# Patient Record
Sex: Female | Born: 1935 | Race: Black or African American | Hispanic: No | Smoking: Never smoker
Health system: Southern US, Community
[De-identification: ages and names within clinical notes are randomized; demographics above are authoritative.]

## PROBLEM LIST (undated history)

## (undated) DIAGNOSIS — I10 Essential (primary) hypertension: Secondary | ICD-10-CM

## (undated) DIAGNOSIS — R002 Palpitations: Secondary | ICD-10-CM

## (undated) DIAGNOSIS — J189 Pneumonia, unspecified organism: Secondary | ICD-10-CM

## (undated) DIAGNOSIS — E7800 Pure hypercholesterolemia, unspecified: Secondary | ICD-10-CM

## (undated) DIAGNOSIS — R251 Tremor, unspecified: Secondary | ICD-10-CM

## (undated) DIAGNOSIS — M316 Other giant cell arteritis: Secondary | ICD-10-CM

## (undated) DIAGNOSIS — R42 Dizziness and giddiness: Secondary | ICD-10-CM

## (undated) DIAGNOSIS — I441 Atrioventricular block, second degree: Secondary | ICD-10-CM

## (undated) HISTORY — DX: Dizziness and giddiness: R42

## (undated) HISTORY — PX: BLADDER SURGERY: SHX569

## (undated) HISTORY — DX: Other giant cell arteritis: M31.6

## (undated) HISTORY — DX: Palpitations: R00.2

## (undated) HISTORY — DX: Atrioventricular block, second degree: I44.1

## (undated) HISTORY — PX: VAGINAL HYSTERECTOMY: SHX2639

## (undated) HISTORY — DX: Tremor, unspecified: R25.1

---

## 1997-12-23 ENCOUNTER — Other Ambulatory Visit: Admission: RE | Admit: 1997-12-23 | Discharge: 1997-12-23 | Payer: Self-pay | Admitting: Internal Medicine

## 1999-09-18 ENCOUNTER — Encounter: Payer: Self-pay | Admitting: Internal Medicine

## 1999-09-18 ENCOUNTER — Encounter: Admission: RE | Admit: 1999-09-18 | Discharge: 1999-09-18 | Payer: Self-pay | Admitting: Internal Medicine

## 2000-01-01 ENCOUNTER — Other Ambulatory Visit: Admission: RE | Admit: 2000-01-01 | Discharge: 2000-01-01 | Payer: Self-pay | Admitting: Internal Medicine

## 2000-02-19 ENCOUNTER — Encounter: Payer: Self-pay | Admitting: Internal Medicine

## 2000-02-19 ENCOUNTER — Ambulatory Visit (HOSPITAL_COMMUNITY): Admission: RE | Admit: 2000-02-19 | Discharge: 2000-02-19 | Payer: Self-pay | Admitting: Internal Medicine

## 2000-09-19 ENCOUNTER — Encounter: Admission: RE | Admit: 2000-09-19 | Discharge: 2000-09-19 | Payer: Self-pay | Admitting: Internal Medicine

## 2000-09-19 ENCOUNTER — Encounter: Payer: Self-pay | Admitting: Internal Medicine

## 2000-12-19 ENCOUNTER — Encounter: Payer: Self-pay | Admitting: Internal Medicine

## 2000-12-19 ENCOUNTER — Ambulatory Visit (HOSPITAL_COMMUNITY): Admission: RE | Admit: 2000-12-19 | Discharge: 2000-12-19 | Payer: Self-pay | Admitting: Internal Medicine

## 2001-09-22 ENCOUNTER — Encounter: Admission: RE | Admit: 2001-09-22 | Discharge: 2001-09-22 | Payer: Self-pay | Admitting: Internal Medicine

## 2001-09-22 ENCOUNTER — Encounter: Payer: Self-pay | Admitting: Internal Medicine

## 2002-02-25 ENCOUNTER — Other Ambulatory Visit: Admission: RE | Admit: 2002-02-25 | Discharge: 2002-02-25 | Payer: Self-pay | Admitting: Internal Medicine

## 2002-09-25 ENCOUNTER — Encounter: Payer: Self-pay | Admitting: Internal Medicine

## 2002-09-25 ENCOUNTER — Encounter: Admission: RE | Admit: 2002-09-25 | Discharge: 2002-09-25 | Payer: Self-pay | Admitting: Internal Medicine

## 2003-09-11 ENCOUNTER — Emergency Department (HOSPITAL_COMMUNITY): Admission: AD | Admit: 2003-09-11 | Discharge: 2003-09-11 | Payer: Self-pay | Admitting: Family Medicine

## 2003-09-27 ENCOUNTER — Encounter: Admission: RE | Admit: 2003-09-27 | Discharge: 2003-09-27 | Payer: Self-pay | Admitting: Internal Medicine

## 2004-09-27 ENCOUNTER — Encounter: Admission: RE | Admit: 2004-09-27 | Discharge: 2004-09-27 | Payer: Self-pay | Admitting: Internal Medicine

## 2004-11-08 ENCOUNTER — Other Ambulatory Visit: Admission: RE | Admit: 2004-11-08 | Discharge: 2004-11-08 | Payer: Self-pay | Admitting: Internal Medicine

## 2005-04-25 ENCOUNTER — Ambulatory Visit (HOSPITAL_COMMUNITY): Admission: RE | Admit: 2005-04-25 | Discharge: 2005-04-25 | Payer: Self-pay | Admitting: Internal Medicine

## 2005-10-01 ENCOUNTER — Encounter: Admission: RE | Admit: 2005-10-01 | Discharge: 2005-10-01 | Payer: Self-pay | Admitting: Internal Medicine

## 2006-10-03 ENCOUNTER — Encounter: Admission: RE | Admit: 2006-10-03 | Discharge: 2006-10-03 | Payer: Self-pay | Admitting: Internal Medicine

## 2007-10-06 ENCOUNTER — Encounter: Admission: RE | Admit: 2007-10-06 | Discharge: 2007-10-06 | Payer: Self-pay | Admitting: Internal Medicine

## 2008-07-28 ENCOUNTER — Encounter: Admission: RE | Admit: 2008-07-28 | Discharge: 2008-07-28 | Payer: Self-pay | Admitting: Orthopedic Surgery

## 2008-10-06 ENCOUNTER — Encounter: Admission: RE | Admit: 2008-10-06 | Discharge: 2008-10-06 | Payer: Self-pay | Admitting: Internal Medicine

## 2009-10-07 ENCOUNTER — Encounter
Admission: RE | Admit: 2009-10-07 | Discharge: 2009-10-07 | Payer: Self-pay | Source: Home / Self Care | Admitting: Internal Medicine

## 2010-08-11 ENCOUNTER — Ambulatory Visit (HOSPITAL_COMMUNITY)
Admission: RE | Admit: 2010-08-11 | Discharge: 2010-08-11 | Payer: Self-pay | Source: Home / Self Care | Attending: Internal Medicine | Admitting: Internal Medicine

## 2010-08-11 LAB — CREATININE, SERUM
GFR calc Af Amer: 51 mL/min — ABNORMAL LOW (ref >60)
GFR calc non Af Amer: 42 mL/min — ABNORMAL LOW (ref >60)

## 2010-08-23 ENCOUNTER — Other Ambulatory Visit: Payer: Self-pay | Admitting: Internal Medicine

## 2010-08-23 DIAGNOSIS — Z1231 Encounter for screening mammogram for malignant neoplasm of breast: Secondary | ICD-10-CM

## 2010-10-09 ENCOUNTER — Ambulatory Visit
Admission: RE | Admit: 2010-10-09 | Discharge: 2010-10-09 | Disposition: A | Discharge status: Home / Self Care | Payer: Medicare Other | Source: Ambulatory Visit | Attending: Internal Medicine | Admitting: Internal Medicine

## 2010-10-09 DIAGNOSIS — Z1231 Encounter for screening mammogram for malignant neoplasm of breast: Secondary | ICD-10-CM

## 2011-07-25 ENCOUNTER — Other Ambulatory Visit: Payer: Self-pay | Admitting: Internal Medicine

## 2011-07-25 DIAGNOSIS — Z1231 Encounter for screening mammogram for malignant neoplasm of breast: Secondary | ICD-10-CM

## 2011-07-30 DIAGNOSIS — I10 Essential (primary) hypertension: Secondary | ICD-10-CM | POA: Diagnosis not present

## 2011-07-30 DIAGNOSIS — E7800 Pure hypercholesterolemia, unspecified: Secondary | ICD-10-CM | POA: Diagnosis not present

## 2011-07-30 DIAGNOSIS — M159 Polyosteoarthritis, unspecified: Secondary | ICD-10-CM | POA: Diagnosis not present

## 2011-07-30 DIAGNOSIS — E559 Vitamin D deficiency, unspecified: Secondary | ICD-10-CM | POA: Diagnosis not present

## 2011-07-30 DIAGNOSIS — I776 Arteritis, unspecified: Secondary | ICD-10-CM | POA: Diagnosis not present

## 2011-08-27 DIAGNOSIS — E7800 Pure hypercholesterolemia, unspecified: Secondary | ICD-10-CM | POA: Diagnosis not present

## 2011-08-27 DIAGNOSIS — I776 Arteritis, unspecified: Secondary | ICD-10-CM | POA: Diagnosis not present

## 2011-08-27 DIAGNOSIS — E559 Vitamin D deficiency, unspecified: Secondary | ICD-10-CM | POA: Diagnosis not present

## 2011-08-27 DIAGNOSIS — I10 Essential (primary) hypertension: Secondary | ICD-10-CM | POA: Diagnosis not present

## 2011-08-27 DIAGNOSIS — M316 Other giant cell arteritis: Secondary | ICD-10-CM | POA: Diagnosis not present

## 2011-09-24 DIAGNOSIS — I10 Essential (primary) hypertension: Secondary | ICD-10-CM | POA: Diagnosis not present

## 2011-09-24 DIAGNOSIS — M316 Other giant cell arteritis: Secondary | ICD-10-CM | POA: Diagnosis not present

## 2011-09-24 DIAGNOSIS — R079 Chest pain, unspecified: Secondary | ICD-10-CM | POA: Diagnosis not present

## 2011-09-24 DIAGNOSIS — E559 Vitamin D deficiency, unspecified: Secondary | ICD-10-CM | POA: Diagnosis not present

## 2011-09-24 DIAGNOSIS — E7800 Pure hypercholesterolemia, unspecified: Secondary | ICD-10-CM | POA: Diagnosis not present

## 2011-09-24 DIAGNOSIS — M159 Polyosteoarthritis, unspecified: Secondary | ICD-10-CM | POA: Diagnosis not present

## 2011-09-25 ENCOUNTER — Ambulatory Visit (HOSPITAL_COMMUNITY)
Admission: RE | Admit: 2011-09-25 | Discharge: 2011-09-25 | Disposition: A | Discharge status: Home / Self Care | Payer: Medicare Other | Source: Ambulatory Visit | Attending: Internal Medicine | Admitting: Internal Medicine

## 2011-09-25 ENCOUNTER — Other Ambulatory Visit: Payer: Self-pay | Admitting: Internal Medicine

## 2011-09-25 DIAGNOSIS — M47814 Spondylosis without myelopathy or radiculopathy, thoracic region: Secondary | ICD-10-CM | POA: Diagnosis not present

## 2011-09-25 DIAGNOSIS — R0602 Shortness of breath: Secondary | ICD-10-CM | POA: Diagnosis not present

## 2011-09-25 DIAGNOSIS — I10 Essential (primary) hypertension: Secondary | ICD-10-CM | POA: Insufficient documentation

## 2011-10-10 ENCOUNTER — Ambulatory Visit
Admission: RE | Admit: 2011-10-10 | Discharge: 2011-10-10 | Disposition: A | Discharge status: Home / Self Care | Payer: BC Managed Care – PPO | Source: Ambulatory Visit | Attending: Internal Medicine | Admitting: Internal Medicine

## 2011-10-10 DIAGNOSIS — Z1231 Encounter for screening mammogram for malignant neoplasm of breast: Secondary | ICD-10-CM

## 2011-10-25 DIAGNOSIS — N390 Urinary tract infection, site not specified: Secondary | ICD-10-CM | POA: Diagnosis not present

## 2011-10-30 DIAGNOSIS — E559 Vitamin D deficiency, unspecified: Secondary | ICD-10-CM | POA: Diagnosis not present

## 2011-10-30 DIAGNOSIS — M316 Other giant cell arteritis: Secondary | ICD-10-CM | POA: Diagnosis not present

## 2011-10-30 DIAGNOSIS — E7800 Pure hypercholesterolemia, unspecified: Secondary | ICD-10-CM | POA: Diagnosis not present

## 2011-10-30 DIAGNOSIS — I10 Essential (primary) hypertension: Secondary | ICD-10-CM | POA: Diagnosis not present

## 2011-11-08 DIAGNOSIS — N390 Urinary tract infection, site not specified: Secondary | ICD-10-CM | POA: Diagnosis not present

## 2011-12-01 ENCOUNTER — Encounter (HOSPITAL_COMMUNITY): Payer: Self-pay | Admitting: Emergency Medicine

## 2011-12-01 ENCOUNTER — Emergency Department (INDEPENDENT_AMBULATORY_CARE_PROVIDER_SITE_OTHER): Payer: BC Managed Care – PPO

## 2011-12-01 ENCOUNTER — Emergency Department (INDEPENDENT_AMBULATORY_CARE_PROVIDER_SITE_OTHER)
Admission: EM | Admit: 2011-12-01 | Discharge: 2011-12-01 | Disposition: A | Discharge status: Home / Self Care | Payer: BC Managed Care – PPO | Source: Home / Self Care | Attending: Emergency Medicine | Admitting: Emergency Medicine

## 2011-12-01 DIAGNOSIS — R05 Cough: Secondary | ICD-10-CM | POA: Diagnosis not present

## 2011-12-01 DIAGNOSIS — J189 Pneumonia, unspecified organism: Secondary | ICD-10-CM

## 2011-12-01 DIAGNOSIS — R509 Fever, unspecified: Secondary | ICD-10-CM | POA: Diagnosis not present

## 2011-12-01 HISTORY — DX: Pure hypercholesterolemia, unspecified: E78.00

## 2011-12-01 HISTORY — DX: Essential (primary) hypertension: I10

## 2011-12-01 MED ORDER — LIDOCAINE HCL (PF) 1 % IJ SOLN
INTRAMUSCULAR | Status: AC
  Filled 2011-12-01: qty 5

## 2011-12-01 MED ORDER — CEFTRIAXONE SODIUM 1 G IJ SOLR
INTRAMUSCULAR | Status: AC
  Filled 2011-12-01: qty 10

## 2011-12-01 MED ORDER — CEFTRIAXONE SODIUM 1 G IJ SOLR
1.0000 g | Freq: Once | INTRAMUSCULAR | Status: AC
  Administered 2011-12-01: 1 g via INTRAMUSCULAR

## 2011-12-01 MED ORDER — AZITHROMYCIN 250 MG PO TABS: ORAL_TABLET | ORAL | Status: AC

## 2011-12-01 MED ORDER — BENZONATATE 200 MG PO CAPS: 200.0000 mg | ORAL_CAPSULE | Freq: Three times a day (TID) | ORAL | Status: AC | PRN

## 2011-12-01 NOTE — Discharge Instructions (Signed)

## 2011-12-01 NOTE — ED Provider Notes (Signed)
Chief Complaint  Patient presents with  . Nasal Congestion    History of Present Illness:   The patient is a 77 year old female who has had a five-day history of cough productive yellow sputum, wheezing, and chest tightness. She denies any chest pain, fever, or chills, but she has had some sweats. Her throat burns, she's had sore throat, hoarseness, nasal congestion with yellow drainage, headache, and sinus pressure. She has a history of asthma as a child and has had pneumonia in the past. She is taking prednisone 10 mg per day for temporal arteritis.  Review of Systems:  Other than noted above, the patient denies any of the following symptoms. Systemic:  No fever, chills, sweats, fatigue, myalgias, headache, or anorexia. Eye:  No redness, pain or drainage. ENT:  No earache, ear congestion, nasal congestion, sneezing, rhinorrhea, sinus pressure, sinus pain, post nasal drip, or sore throat. Lungs:  No cough, sputum production, wheezing, shortness of breath, or chest pain. GI:  No abdominal pain, nausea, vomiting, or diarrhea. Skin:  No rash or itching.  PMFSH:  Past medical history, family history, social history, meds, and allergies were reviewed.  Physical Exam:   Vital signs:  BP 166/104  Pulse 95  Temp(Src) 100.6 F (38.1 C) (Oral)  Resp 18  SpO2 96% General:  Alert, in no distress. Eye:  No conjunctival injection or drainage. Lids were normal. ENT:  TMs and canals were normal, without erythema or inflammation.  Nasal mucosa was clear and uncongested, without drainage.  Mucous membranes were moist.  Pharynx was clear, without exudate or drainage.  There were no oral ulcerations or lesions. Neck:  Supple, no adenopathy, tenderness or mass. Lungs:  No respiratory distress.  Lungs were clear to auscultation, without wheezes, rales or rhonchi.  Breath sounds were clear and equal bilaterally. Lungs were resonant to percussion.  No egophony. Heart:  Regular rhythm, without gallops, murmers  or rubs. Skin:  Clear, warm, and dry, without rash or lesions.  Radiology:  Dg Chest 2 View  12/01/2011  *RADIOLOGY REPORT*  Clinical Data: Cough and fever.  CHEST - 2 VIEW  Comparison: 09/25/2011 and prior chest radiographs  Findings: The cardiomediastinal silhouette is unremarkable. Slightly increased opacity at the medial right lung base is noted and pneumonia is not excluded. There is no evidence of pleural effusion, pulmonary mass or pneumothorax. No acute bony abnormalities are identified.  IMPRESSION: Increasing opacity medial right lung base - pneumonia not excluded.  Original Report Authenticated By: Rosendo Gros, M.D.   Course in Urgent Care Center:   She was given Rocephin 1 g IM and tolerated this well without any immediate side effects.  Assessment:  The encounter diagnosis was Community acquired pneumonia.  Plan:   1.  The following meds were prescribed:   New Prescriptions   AZITHROMYCIN (ZITHROMAX Z-PAK) 250 MG TABLET    Take as directed.   BENZONATATE (TESSALON) 200 MG CAPSULE    Take 1 capsule (200 mg total) by mouth 3 (three) times daily as needed for cough.   2.  The patient was instructed in symptomatic care and handouts were given. 3.  The patient was told to return if becoming worse in any way, if no better in 3 or 4 days, and given some red flag symptoms that would indicate earlier return. She is to return in 48 hours for recheck. I also suggested a followup chest x-ray in a month.   Reuben Likes, MD 12/01/11 [id number]

## 2011-12-01 NOTE — ED Notes (Signed)
Congestion, headache, coughing, runny nose, hoarsness

## 2011-12-04 ENCOUNTER — Emergency Department (INDEPENDENT_AMBULATORY_CARE_PROVIDER_SITE_OTHER)
Admission: EM | Admit: 2011-12-04 | Discharge: 2011-12-04 | Disposition: A | Discharge status: Home / Self Care | Payer: BC Managed Care – PPO | Source: Home / Self Care | Attending: Family Medicine | Admitting: Family Medicine

## 2011-12-04 ENCOUNTER — Emergency Department (INDEPENDENT_AMBULATORY_CARE_PROVIDER_SITE_OTHER): Payer: BC Managed Care – PPO

## 2011-12-04 ENCOUNTER — Encounter (HOSPITAL_COMMUNITY): Payer: Self-pay | Admitting: *Deleted

## 2011-12-04 DIAGNOSIS — R918 Other nonspecific abnormal finding of lung field: Secondary | ICD-10-CM | POA: Diagnosis not present

## 2011-12-04 DIAGNOSIS — R05 Cough: Secondary | ICD-10-CM | POA: Diagnosis not present

## 2011-12-04 DIAGNOSIS — J189 Pneumonia, unspecified organism: Secondary | ICD-10-CM | POA: Diagnosis not present

## 2011-12-04 HISTORY — DX: Pneumonia, unspecified organism: J18.9

## 2011-12-04 NOTE — ED Provider Notes (Signed)
History     CSN: [id number]  Arrival date & time 12/04/11  1033   First MD Initiated Contact with Patient 12/04/11 1221      Chief Complaint  Patient presents with  . Follow-up    (Consider location/radiation/quality/duration/timing/severity/associated sxs/prior treatment) Patient is a 77 y.o. female presenting with cough. The history is provided by the patient.  Cough This is a recurrent problem. The current episode started more than 2 days ago (seen here 5/18 with dx of pneumonia, taking med  but still coughing.Marland Kitchen). The problem has been gradually improving. The cough is non-productive. There has been no fever. Pertinent negatives include no chest pain, no chills and no shortness of breath.    Past Medical History  Diagnosis Date  . Hypertension   . High cholesterol   . Pneumonia     Past Surgical History  Procedure Date  . Bladder surgery     No family history on file.  History  Substance Use Topics  . Smoking status: Never Smoker   . Smokeless tobacco: Not on file  . Alcohol Use: No    OB History    Grav Para Term Preterm Abortions TAB SAB Ect Mult Living                  Review of Systems  Constitutional: Negative.  Negative for chills.  HENT: Negative.   Respiratory: Positive for cough. Negative for shortness of breath.   Cardiovascular: Negative for chest pain.  Gastrointestinal: Negative.   Skin: Negative for rash.    Allergies  Review of patient's allergies indicates no known allergies.  Home Medications   Current Outpatient Rx  Name Route Sig Dispense Refill  . ALBUTEROL IN Inhalation Inhale into the lungs.    . ASPIRIN 81 MG PO TABS Oral Take 81 mg by mouth daily.    . ATORVASTATIN CALCIUM 20 MG PO TABS Oral Take 20 mg by mouth daily.    . AZITHROMYCIN 250 MG PO TABS  Take as directed. 6 tablet 0  . BENZONATATE 200 MG PO CAPS Oral Take 1 capsule (200 mg total) by mouth 3 (three) times daily as needed for cough. 30 capsule 0  . CALCIUM 500  PO Oral Take by mouth.    . GUAIFENESIN ER 600 MG PO TB12 Oral Take 1,200 mg by mouth 2 (two) times daily.    Marland Kitchen LORATADINE 10 MG PO TABS Oral Take 10 mg by mouth daily.    Marland Kitchen LOSARTAN POTASSIUM PO Oral Take by mouth.    Marland Kitchen OVER THE COUNTER MEDICATION  Cough drops    . PREDNISONE 10 MG PO TABS Oral Take 10 mg by mouth daily.      BP 110/74  Pulse 75  Temp(Src) 97.7 F (36.5 C) (Oral)  Resp 18  SpO2 97%  Physical Exam  Nursing note and vitals reviewed. Constitutional: She is oriented to person, place, and time. She appears well-developed and well-nourished.  HENT:  Head: Normocephalic.  Right Ear: External ear normal.  Left Ear: External ear normal.  Mouth/Throat: Oropharynx is clear and moist.  Eyes: Conjunctivae and EOM are normal. Pupils are equal, round, and reactive to light.  Neck: Normal range of motion. Neck supple.  Cardiovascular: Normal rate, regular rhythm, normal heart sounds and intact distal pulses.   Pulmonary/Chest: Effort normal and breath sounds normal.  Musculoskeletal: She exhibits no edema.  Lymphadenopathy:    She has no cervical adenopathy.  Neurological: She is alert and oriented to person, place,  and time.  Skin: Skin is warm and dry.  Psychiatric: She has a normal mood and affect.    ED Course  Procedures (including critical care time)  Labs Reviewed - No data to display Dg Chest 2 View  12/04/2011  *RADIOLOGY REPORT*  Clinical Data: History of cough.  Possible pneumonia.  CHEST - 2 VIEW  Comparison: Chest x-ray 12/01/2011.  Findings: Lung volumes are normal.  No consolidative airspace disease.  No pleural effusions.  No pneumothorax.  No pulmonary nodule or mass noted.  Pulmonary vasculature and the cardiomediastinal silhouette are within normal limits.  IMPRESSION: 1. No radiographic evidence of acute cardiopulmonary disease. 2.  Previously noted ill-defined opacity in the medial aspect of the right base is no longer clearly identified.  This could  reflect positive response to therapy if the patient has been treated for presumed pneumonia.  Original Report Authenticated By: Florencia Reasons, M.D.     1. CAP (community acquired pneumonia)       MDM  X-rays reviewed and report per radiologist.         Linna Hoff, MD 12/04/11 1355

## 2011-12-04 NOTE — ED Notes (Signed)
Pt  Seen    Seen   4  Days     Ago  For     pnuemonia        She   Reports         She    Is  Taking  Her  meds        And  Although  Still  Has  A  Cough  She  Is  Better          She    Is  Awake  As  Well  As    Alert  And  oriented   She  Is  Sitting upright on  Exam table  Speaking in  Complete  sentances        Alert  And  Oriented

## 2011-12-04 NOTE — Discharge Instructions (Signed)
Finish medicine , drink lots of water, return or see your doctor if further problems.

## 2011-12-11 DIAGNOSIS — J189 Pneumonia, unspecified organism: Secondary | ICD-10-CM | POA: Diagnosis not present

## 2011-12-11 DIAGNOSIS — E7800 Pure hypercholesterolemia, unspecified: Secondary | ICD-10-CM | POA: Diagnosis not present

## 2011-12-11 DIAGNOSIS — I10 Essential (primary) hypertension: Secondary | ICD-10-CM | POA: Diagnosis not present

## 2011-12-11 DIAGNOSIS — M159 Polyosteoarthritis, unspecified: Secondary | ICD-10-CM | POA: Diagnosis not present

## 2011-12-25 ENCOUNTER — Other Ambulatory Visit: Payer: Self-pay | Admitting: Internal Medicine

## 2011-12-25 ENCOUNTER — Ambulatory Visit
Admission: RE | Admit: 2011-12-25 | Discharge: 2011-12-25 | Disposition: A | Discharge status: Home / Self Care | Payer: Medicare Other | Source: Ambulatory Visit | Attending: Internal Medicine | Admitting: Internal Medicine

## 2011-12-25 DIAGNOSIS — J189 Pneumonia, unspecified organism: Secondary | ICD-10-CM

## 2011-12-25 DIAGNOSIS — Z8701 Personal history of pneumonia (recurrent): Secondary | ICD-10-CM | POA: Diagnosis not present

## 2011-12-25 DIAGNOSIS — R0602 Shortness of breath: Secondary | ICD-10-CM | POA: Diagnosis not present

## 2011-12-25 DIAGNOSIS — R05 Cough: Secondary | ICD-10-CM | POA: Diagnosis not present

## 2012-01-22 DIAGNOSIS — I10 Essential (primary) hypertension: Secondary | ICD-10-CM | POA: Diagnosis not present

## 2012-01-22 DIAGNOSIS — E559 Vitamin D deficiency, unspecified: Secondary | ICD-10-CM | POA: Diagnosis not present

## 2012-01-22 DIAGNOSIS — M159 Polyosteoarthritis, unspecified: Secondary | ICD-10-CM | POA: Diagnosis not present

## 2012-01-22 DIAGNOSIS — R5383 Other fatigue: Secondary | ICD-10-CM | POA: Diagnosis not present

## 2012-01-22 DIAGNOSIS — E7800 Pure hypercholesterolemia, unspecified: Secondary | ICD-10-CM | POA: Diagnosis not present

## 2012-04-28 DIAGNOSIS — M159 Polyosteoarthritis, unspecified: Secondary | ICD-10-CM | POA: Diagnosis not present

## 2012-04-28 DIAGNOSIS — R7989 Other specified abnormal findings of blood chemistry: Secondary | ICD-10-CM | POA: Diagnosis not present

## 2012-04-28 DIAGNOSIS — I10 Essential (primary) hypertension: Secondary | ICD-10-CM | POA: Diagnosis not present

## 2012-04-28 DIAGNOSIS — E669 Obesity, unspecified: Secondary | ICD-10-CM | POA: Diagnosis not present

## 2012-04-28 DIAGNOSIS — H5710 Ocular pain, unspecified eye: Secondary | ICD-10-CM | POA: Diagnosis not present

## 2012-04-28 DIAGNOSIS — M316 Other giant cell arteritis: Secondary | ICD-10-CM | POA: Diagnosis not present

## 2012-04-28 DIAGNOSIS — E7800 Pure hypercholesterolemia, unspecified: Secondary | ICD-10-CM | POA: Diagnosis not present

## 2012-05-22 DIAGNOSIS — H40039 Anatomical narrow angle, unspecified eye: Secondary | ICD-10-CM | POA: Diagnosis not present

## 2012-05-28 DIAGNOSIS — H2510 Age-related nuclear cataract, unspecified eye: Secondary | ICD-10-CM | POA: Diagnosis not present

## 2012-05-28 DIAGNOSIS — H40039 Anatomical narrow angle, unspecified eye: Secondary | ICD-10-CM | POA: Diagnosis not present

## 2012-06-04 ENCOUNTER — Other Ambulatory Visit: Payer: Self-pay | Admitting: Internal Medicine

## 2012-06-04 DIAGNOSIS — I10 Essential (primary) hypertension: Secondary | ICD-10-CM | POA: Diagnosis not present

## 2012-06-04 DIAGNOSIS — M316 Other giant cell arteritis: Secondary | ICD-10-CM | POA: Diagnosis not present

## 2012-06-04 DIAGNOSIS — I776 Arteritis, unspecified: Secondary | ICD-10-CM | POA: Diagnosis not present

## 2012-06-04 DIAGNOSIS — E559 Vitamin D deficiency, unspecified: Secondary | ICD-10-CM | POA: Diagnosis not present

## 2012-06-04 DIAGNOSIS — M159 Polyosteoarthritis, unspecified: Secondary | ICD-10-CM | POA: Diagnosis not present

## 2012-06-04 DIAGNOSIS — E538 Deficiency of other specified B group vitamins: Secondary | ICD-10-CM | POA: Diagnosis not present

## 2012-06-05 DIAGNOSIS — H40039 Anatomical narrow angle, unspecified eye: Secondary | ICD-10-CM | POA: Diagnosis not present

## 2012-06-09 ENCOUNTER — Ambulatory Visit (HOSPITAL_COMMUNITY)
Admission: RE | Admit: 2012-06-09 | Discharge: 2012-06-09 | Disposition: A | Discharge status: Home / Self Care | Payer: Medicare Other | Source: Ambulatory Visit | Attending: Internal Medicine | Admitting: Internal Medicine

## 2012-06-09 DIAGNOSIS — R51 Headache: Secondary | ICD-10-CM | POA: Insufficient documentation

## 2012-06-15 ENCOUNTER — Emergency Department (INDEPENDENT_AMBULATORY_CARE_PROVIDER_SITE_OTHER)
Admission: EM | Admit: 2012-06-15 | Discharge: 2012-06-15 | Disposition: A | Discharge status: Home / Self Care | Payer: Medicare Other | Source: Home / Self Care | Attending: Family Medicine | Admitting: Family Medicine

## 2012-06-15 ENCOUNTER — Encounter (HOSPITAL_COMMUNITY): Payer: Self-pay

## 2012-06-15 DIAGNOSIS — I10 Essential (primary) hypertension: Secondary | ICD-10-CM | POA: Diagnosis not present

## 2012-06-15 NOTE — ED Notes (Addendum)
C/o facial swelling and hypertension, states that sx started today, patient states that she was taken off Amlodipine, Atorvastatin by Dr Chestine Spore, states that she was told to tale Losartan 100 mg and that she last took that on Wednesday, says it makes her "sick" states that it gives her a headache , facial and neck pain

## 2012-06-15 NOTE — ED Provider Notes (Signed)
History     CSN: [id number]  Arrival date & time 06/15/12  1033   First MD Initiated Contact with Patient 06/15/12 1150      Chief Complaint  Patient presents with  . Facial Swelling    (Consider location/radiation/quality/duration/timing/severity/associated sxs/prior treatment) HPI Comments: 77 y/o female with h/o HTN, currently on prednisone due to recent diagnosis of temporal arteritis. Patient explains to me she recently saw her primary doctor Chestine Spore) who asked to stop taking Amlodipine 5 mg and cholesterol medication. She does not know why these medications were stopped. She was then started on losartan 100 mg she took only one time, she thinks she might also have a prescription in the pharmacy for a "fluid pill" that she has not filled yet. Patient reports feeling "sick" after taking losartan for first time 4 days ago. She is vague describing her symptoms but reports nausea, neck discomfort and headache. She has not taken this medication since (was off blood pressure medications for 2 days) was feeling well but she took her blood pressure this morning at home and was "high" she decided to take a amlodipine 5 mg this morning and came here. She denies current headache, dizziness, chest discomfort, shortness of breath or leg swelling. She reports has been feeling her face "puffy" for several days. Patient blood pressure here is 134/93. Patient also unsure abut how long to take prednisone.    Past Medical History  Diagnosis Date  . Hypertension   . High cholesterol   . Pneumonia     Past Surgical History  Procedure Date  . Bladder surgery     No family history on file.  History  Substance Use Topics  . Smoking status: Never Smoker   . Smokeless tobacco: Not on file  . Alcohol Use: No    OB History    Grav Para Term Preterm Abortions TAB SAB Ect Mult Living                  Review of Systems  Constitutional: Negative for fever, chills, appetite change and fatigue.    HENT: Positive for facial swelling and neck pain. Negative for tinnitus.   Eyes: Negative for visual disturbance.  Respiratory: Negative for cough, chest tightness and shortness of breath.   Cardiovascular: Negative for chest pain, palpitations and leg swelling.  Skin: Negative for rash.  Neurological: Negative for dizziness, tremors, seizures, syncope, facial asymmetry, speech difficulty, weakness, light-headedness, numbness and headaches.    Allergies  Review of patient's allergies indicates no known allergies.  Home Medications   Current Outpatient Rx  Name  Route  Sig  Dispense  Refill  . ALBUTEROL IN   Inhalation   Inhale into the lungs.         . ASPIRIN 81 MG PO TABS   Oral   Take 81 mg by mouth daily.         . ATORVASTATIN CALCIUM 20 MG PO TABS   Oral   Take 20 mg by mouth daily.         Marland Kitchen CALCIUM 500 PO   Oral   Take by mouth.         . GUAIFENESIN ER 600 MG PO TB12   Oral   Take 1,200 mg by mouth 2 (two) times daily.         Marland Kitchen LORATADINE 10 MG PO TABS   Oral   Take 10 mg by mouth daily.         Marland Kitchen LOSARTAN  POTASSIUM PO   Oral   Take 100 mg by mouth daily.          Marland Kitchen OVER THE COUNTER MEDICATION      Cough drops         . PREDNISONE 10 MG PO TABS   Oral   Take 10 mg by mouth daily.           BP 134/93  Pulse 68  Temp 98.6 F (37 C) (Oral)  Resp 18  SpO2 99%  Physical Exam  Nursing note and vitals reviewed. Constitutional: She is oriented to person, place, and time. She appears well-developed and well-nourished. No distress.       Comfortable cooperative, pleasant sitting in examen table.   HENT:  Head: Normocephalic and atraumatic.  Right Ear: External ear normal.  Left Ear: External ear normal.  Nose: Nose normal.  Mouth/Throat: Oropharynx is clear and moist. No oropharyngeal exudate.       No angioedema. No obvious facial swelling or erythema.  Eyes: Conjunctivae normal and EOM are normal. Pupils are equal, round,  and reactive to light. Right eye exhibits no discharge. Left eye exhibits no discharge.  Neck: Neck supple. No JVD present.  Cardiovascular: Normal rate, regular rhythm and normal heart sounds.  Exam reveals no gallop.        No low extremity edema.  Pulmonary/Chest: Effort normal and breath sounds normal. No respiratory distress. She has no wheezes. She has no rales. She exhibits no tenderness.  Lymphadenopathy:    She has no cervical adenopathy.  Neurological: She is alert and oriented to person, place, and time.  Skin: No rash noted. She is not diaphoretic.  Psychiatric: She has a normal mood and affect. Her behavior is normal. Judgment and thought content normal.    ED Course  Procedures (including critical care time)  Labs Reviewed - No data to display No results found.   1. Hypertension       MDM  H/o hypertension with some confusion about current blood pressure treatment. Possible side effect to losartan? Asymptomatic here. Normal neuro and cardiovascular exam no chest pain or stroke symptoms. BP borderline at 134/93 on amlodipine 5 mg today only. Decided to avoid adding confusion by not changing or adding new medications today. Asked patient to continue to take amlodipine only once a day until she calls her doctor tomorrow and schedule a follow up appointment to discuss her medications. Also instructed not to stop prednisone abruptly until she discusses this treatment with her doctor and explained she will need to follow taper dosing before stopping. Asked to go to the ED if new or recurrent symptoms.         Sharin Grave, MD 06/17/12 1034

## 2012-06-19 DIAGNOSIS — M159 Polyosteoarthritis, unspecified: Secondary | ICD-10-CM | POA: Diagnosis not present

## 2012-06-19 DIAGNOSIS — E559 Vitamin D deficiency, unspecified: Secondary | ICD-10-CM | POA: Diagnosis not present

## 2012-06-19 DIAGNOSIS — E7800 Pure hypercholesterolemia, unspecified: Secondary | ICD-10-CM | POA: Diagnosis not present

## 2012-06-19 DIAGNOSIS — I10 Essential (primary) hypertension: Secondary | ICD-10-CM | POA: Diagnosis not present

## 2012-07-07 DIAGNOSIS — H35049 Retinal micro-aneurysms, unspecified, unspecified eye: Secondary | ICD-10-CM | POA: Diagnosis not present

## 2012-07-07 DIAGNOSIS — H35039 Hypertensive retinopathy, unspecified eye: Secondary | ICD-10-CM | POA: Diagnosis not present

## 2012-07-07 DIAGNOSIS — H40039 Anatomical narrow angle, unspecified eye: Secondary | ICD-10-CM | POA: Diagnosis not present

## 2012-07-07 DIAGNOSIS — H2510 Age-related nuclear cataract, unspecified eye: Secondary | ICD-10-CM | POA: Diagnosis not present

## 2012-07-07 DIAGNOSIS — H43399 Other vitreous opacities, unspecified eye: Secondary | ICD-10-CM | POA: Diagnosis not present

## 2012-07-29 DIAGNOSIS — I10 Essential (primary) hypertension: Secondary | ICD-10-CM | POA: Diagnosis not present

## 2012-07-29 DIAGNOSIS — M316 Other giant cell arteritis: Secondary | ICD-10-CM | POA: Diagnosis not present

## 2012-07-29 DIAGNOSIS — M159 Polyosteoarthritis, unspecified: Secondary | ICD-10-CM | POA: Diagnosis not present

## 2012-07-29 DIAGNOSIS — E7800 Pure hypercholesterolemia, unspecified: Secondary | ICD-10-CM | POA: Diagnosis not present

## 2012-08-07 ENCOUNTER — Emergency Department (HOSPITAL_COMMUNITY)
Admission: EM | Admit: 2012-08-07 | Discharge: 2012-08-07 | Disposition: A | Discharge status: Home / Self Care | Payer: Medicare Other | Attending: Emergency Medicine | Admitting: Emergency Medicine

## 2012-08-07 ENCOUNTER — Emergency Department (HOSPITAL_COMMUNITY): Payer: Medicare Other

## 2012-08-07 ENCOUNTER — Encounter (HOSPITAL_COMMUNITY): Payer: Self-pay | Admitting: Emergency Medicine

## 2012-08-07 DIAGNOSIS — E7800 Pure hypercholesterolemia, unspecified: Secondary | ICD-10-CM | POA: Insufficient documentation

## 2012-08-07 DIAGNOSIS — Z7982 Long term (current) use of aspirin: Secondary | ICD-10-CM | POA: Insufficient documentation

## 2012-08-07 DIAGNOSIS — I10 Essential (primary) hypertension: Secondary | ICD-10-CM | POA: Insufficient documentation

## 2012-08-07 DIAGNOSIS — Z8701 Personal history of pneumonia (recurrent): Secondary | ICD-10-CM | POA: Insufficient documentation

## 2012-08-07 DIAGNOSIS — Z79899 Other long term (current) drug therapy: Secondary | ICD-10-CM | POA: Insufficient documentation

## 2012-08-07 DIAGNOSIS — X58XXXA Exposure to other specified factors, initial encounter: Secondary | ICD-10-CM | POA: Insufficient documentation

## 2012-08-07 DIAGNOSIS — S99922A Unspecified injury of left foot, initial encounter: Secondary | ICD-10-CM

## 2012-08-07 DIAGNOSIS — S8990XA Unspecified injury of unspecified lower leg, initial encounter: Secondary | ICD-10-CM | POA: Insufficient documentation

## 2012-08-07 DIAGNOSIS — Y929 Unspecified place or not applicable: Secondary | ICD-10-CM | POA: Insufficient documentation

## 2012-08-07 DIAGNOSIS — S99919A Unspecified injury of unspecified ankle, initial encounter: Secondary | ICD-10-CM | POA: Insufficient documentation

## 2012-08-07 DIAGNOSIS — Y939 Activity, unspecified: Secondary | ICD-10-CM | POA: Insufficient documentation

## 2012-08-07 NOTE — ED Notes (Signed)
Pt presents to ED today with c/o left foot toe pain after bumping into a shredder yesterday. Patient placed tape on toes yesterday but continues to have pain.

## 2012-08-07 NOTE — ED Notes (Signed)
Left 4th toe pain. No deformity noted.

## 2012-08-07 NOTE — ED Provider Notes (Signed)
Medical screening examination/treatment/procedure(s) were performed by non-physician practitioner and as supervising physician I was immediately available for consultation/collaboration.   Carleene Cooper III, MD 08/07/12 2024

## 2012-08-07 NOTE — ED Provider Notes (Signed)
History     CSN: [id number]  Arrival date & time 08/07/12  1048   First MD Initiated Contact with Patient 08/07/12 1301      Chief Complaint  Patient presents with  . Toe Pain    (Consider location/radiation/quality/duration/timing/severity/associated sxs/prior treatment) Patient is a 78 y.o. female presenting with toe pain. The history is provided by the patient. No language interpreter was used.  Toe Pain This is a new problem. The current episode started yesterday. The problem occurs constantly. The problem has been unchanged. Pertinent negatives include no chills, fever, joint swelling or rash. The symptoms are aggravated by bending, walking and standing. Treatments tried: alcohol rub, buddy tape. The treatment provided mild relief.      Past Medical History  Diagnosis Date  . Hypertension   . High cholesterol   . Pneumonia     Past Surgical History  Procedure Date  . Bladder surgery     History reviewed. No pertinent family history.  History  Substance Use Topics  . Smoking status: Never Smoker   . Smokeless tobacco: Not on file  . Alcohol Use: No    OB History    Grav Para Term Preterm Abortions TAB SAB Ect Mult Living                  Review of Systems  Constitutional: Negative for fever and chills.       10 Systems reviewed and all are negative for acute change except as noted in the HPI.   Musculoskeletal: Negative for joint swelling.  Skin: Negative for rash.    Allergies  Review of patient's allergies indicates no known allergies.  Home Medications   Current Outpatient Rx  Name  Route  Sig  Dispense  Refill  . AMLODIPINE BESYLATE 5 MG PO TABS   Oral   Take 5 mg by mouth daily.         . ASPIRIN 81 MG PO TABS   Oral   Take 81 mg by mouth daily.         . ATORVASTATIN CALCIUM 20 MG PO TABS   Oral   Take 20 mg by mouth daily.         Marland Kitchen CALCIUM 600 + D PO   Oral   Take 1 tablet by mouth daily.         . FUROSEMIDE 20 MG PO  TABS   Oral   Take 20 mg by mouth 2 (two) times daily.         Marland Kitchen PREDNISONE 10 MG PO TABS   Oral   Take 5-10 mg by mouth 2 (two) times daily. Take 10mg  in the morning and 5mg  in the evening           BP 130/87  Pulse 86  Temp 97.8 F (36.6 C) (Oral)  Resp 18  SpO2 97%  Physical Exam  Nursing note and vitals reviewed. Constitutional: She appears well-developed and well-nourished. No distress.  HENT:  Head: Atraumatic.  Eyes: Conjunctivae normal are normal.  Neck: Neck supple.  Musculoskeletal: She exhibits tenderness (L foot: 4th toe with tenderness to tip of toe without swelling, deformity, rash, or joint pain. no pain to adjacent toes.  NVI. L ankle with FROM.  ).  Neurological: She is alert.  Skin: No rash noted.  Psychiatric: She has a normal mood and affect.    ED Course  Procedures (including critical care time)  Labs Reviewed - No data to display Dg Toe  4th Left  08/07/2012  *RADIOLOGY REPORT*  Clinical Data: Left fourth toe injury, pain.  LEFT FOURTH TOE  Comparison: None  Findings: No acute bony abnormality.  No visible fracture.  Joint spaces are maintained.  IMPRESSION: No acute bony abnormality.   Original Report Authenticated By: Charlett Nose, M.D.      No diagnosis found.  1. Toe sprain  MDM  Pt presents to ED today with c/o left foot toe pain after bumping into a shredder yesterday. Patient placed tape on toes yesterday but continues to have pain.  1:34 PM Xray reviewed by me show no acute fx or dislocation of affected toe.  Reassurance given.  RICE therapy discussed.  Pt will continue to buddy tape toe for comfort.    BP 130/87  Pulse 86  Temp 97.8 F (36.6 C) (Oral)  Resp 18  SpO2 97%  I have reviewed nursing notes and vital signs. I personally reviewed the imaging tests through PACS system  I reviewed available ER/hospitalization records thought the EMR      Fayrene Helper, [place] 08/07/12 1335

## 2012-08-07 NOTE — ED Notes (Signed)
Returned from xray

## 2012-08-07 NOTE — ED Notes (Signed)
Patient transported to X-ray 

## 2012-08-25 ENCOUNTER — Other Ambulatory Visit: Payer: Self-pay | Admitting: Internal Medicine

## 2012-08-25 DIAGNOSIS — Z1231 Encounter for screening mammogram for malignant neoplasm of breast: Secondary | ICD-10-CM

## 2012-10-10 ENCOUNTER — Ambulatory Visit: Payer: Medicare Other

## 2012-10-13 ENCOUNTER — Encounter (INDEPENDENT_AMBULATORY_CARE_PROVIDER_SITE_OTHER): Payer: Medicare Other | Admitting: Ophthalmology

## 2012-10-13 DIAGNOSIS — H35039 Hypertensive retinopathy, unspecified eye: Secondary | ICD-10-CM

## 2012-10-13 DIAGNOSIS — H43819 Vitreous degeneration, unspecified eye: Secondary | ICD-10-CM

## 2012-10-13 DIAGNOSIS — H3530 Unspecified macular degeneration: Secondary | ICD-10-CM | POA: Diagnosis not present

## 2012-10-13 DIAGNOSIS — I10 Essential (primary) hypertension: Secondary | ICD-10-CM | POA: Diagnosis not present

## 2012-10-13 DIAGNOSIS — H2510 Age-related nuclear cataract, unspecified eye: Secondary | ICD-10-CM

## 2012-10-13 DIAGNOSIS — H35359 Cystoid macular degeneration, unspecified eye: Secondary | ICD-10-CM | POA: Diagnosis not present

## 2012-10-27 DIAGNOSIS — M316 Other giant cell arteritis: Secondary | ICD-10-CM | POA: Diagnosis not present

## 2012-11-13 ENCOUNTER — Ambulatory Visit: Payer: Medicare Other

## 2012-11-14 ENCOUNTER — Encounter (INDEPENDENT_AMBULATORY_CARE_PROVIDER_SITE_OTHER): Payer: Medicare Other | Admitting: Ophthalmology

## 2012-11-14 DIAGNOSIS — I10 Essential (primary) hypertension: Secondary | ICD-10-CM | POA: Diagnosis not present

## 2012-11-14 DIAGNOSIS — H43819 Vitreous degeneration, unspecified eye: Secondary | ICD-10-CM

## 2012-11-14 DIAGNOSIS — H2510 Age-related nuclear cataract, unspecified eye: Secondary | ICD-10-CM

## 2012-11-14 DIAGNOSIS — H35359 Cystoid macular degeneration, unspecified eye: Secondary | ICD-10-CM | POA: Diagnosis not present

## 2012-11-14 DIAGNOSIS — H35039 Hypertensive retinopathy, unspecified eye: Secondary | ICD-10-CM

## 2012-11-14 DIAGNOSIS — H3530 Unspecified macular degeneration: Secondary | ICD-10-CM

## 2012-11-14 DIAGNOSIS — H4010X0 Unspecified open-angle glaucoma, stage unspecified: Secondary | ICD-10-CM

## 2012-12-22 DIAGNOSIS — M316 Other giant cell arteritis: Secondary | ICD-10-CM | POA: Diagnosis not present

## 2012-12-22 DIAGNOSIS — IMO0001 Reserved for inherently not codable concepts without codable children: Secondary | ICD-10-CM | POA: Diagnosis not present

## 2012-12-26 ENCOUNTER — Encounter (INDEPENDENT_AMBULATORY_CARE_PROVIDER_SITE_OTHER): Payer: Medicare Other | Admitting: Ophthalmology

## 2012-12-26 DIAGNOSIS — H35039 Hypertensive retinopathy, unspecified eye: Secondary | ICD-10-CM

## 2012-12-26 DIAGNOSIS — I10 Essential (primary) hypertension: Secondary | ICD-10-CM | POA: Diagnosis not present

## 2012-12-26 DIAGNOSIS — H35359 Cystoid macular degeneration, unspecified eye: Secondary | ICD-10-CM | POA: Diagnosis not present

## 2012-12-26 DIAGNOSIS — H3530 Unspecified macular degeneration: Secondary | ICD-10-CM | POA: Diagnosis not present

## 2012-12-26 DIAGNOSIS — H43819 Vitreous degeneration, unspecified eye: Secondary | ICD-10-CM

## 2012-12-26 DIAGNOSIS — H2510 Age-related nuclear cataract, unspecified eye: Secondary | ICD-10-CM

## 2013-01-19 DIAGNOSIS — E559 Vitamin D deficiency, unspecified: Secondary | ICD-10-CM | POA: Diagnosis not present

## 2013-01-19 DIAGNOSIS — M159 Polyosteoarthritis, unspecified: Secondary | ICD-10-CM | POA: Diagnosis not present

## 2013-01-19 DIAGNOSIS — E7800 Pure hypercholesterolemia, unspecified: Secondary | ICD-10-CM | POA: Diagnosis not present

## 2013-01-19 DIAGNOSIS — I10 Essential (primary) hypertension: Secondary | ICD-10-CM | POA: Diagnosis not present

## 2013-02-20 ENCOUNTER — Encounter (INDEPENDENT_AMBULATORY_CARE_PROVIDER_SITE_OTHER): Payer: Medicare Other | Admitting: Ophthalmology

## 2013-02-20 DIAGNOSIS — H43819 Vitreous degeneration, unspecified eye: Secondary | ICD-10-CM

## 2013-02-20 DIAGNOSIS — H3530 Unspecified macular degeneration: Secondary | ICD-10-CM

## 2013-02-20 DIAGNOSIS — H35039 Hypertensive retinopathy, unspecified eye: Secondary | ICD-10-CM | POA: Diagnosis not present

## 2013-02-20 DIAGNOSIS — H2510 Age-related nuclear cataract, unspecified eye: Secondary | ICD-10-CM

## 2013-02-20 DIAGNOSIS — H35359 Cystoid macular degeneration, unspecified eye: Secondary | ICD-10-CM

## 2013-02-20 DIAGNOSIS — I10 Essential (primary) hypertension: Secondary | ICD-10-CM | POA: Diagnosis not present

## 2013-02-25 ENCOUNTER — Encounter (INDEPENDENT_AMBULATORY_CARE_PROVIDER_SITE_OTHER): Payer: Medicare Other | Admitting: Ophthalmology

## 2013-03-23 DIAGNOSIS — E559 Vitamin D deficiency, unspecified: Secondary | ICD-10-CM | POA: Diagnosis not present

## 2013-03-23 DIAGNOSIS — E7800 Pure hypercholesterolemia, unspecified: Secondary | ICD-10-CM | POA: Diagnosis not present

## 2013-03-23 DIAGNOSIS — M316 Other giant cell arteritis: Secondary | ICD-10-CM | POA: Diagnosis not present

## 2013-03-23 DIAGNOSIS — I10 Essential (primary) hypertension: Secondary | ICD-10-CM | POA: Diagnosis not present

## 2013-03-23 DIAGNOSIS — M159 Polyosteoarthritis, unspecified: Secondary | ICD-10-CM | POA: Diagnosis not present

## 2013-03-23 DIAGNOSIS — E785 Hyperlipidemia, unspecified: Secondary | ICD-10-CM | POA: Diagnosis not present

## 2013-04-03 ENCOUNTER — Encounter (INDEPENDENT_AMBULATORY_CARE_PROVIDER_SITE_OTHER): Payer: Medicare Other | Admitting: Ophthalmology

## 2013-04-03 DIAGNOSIS — I10 Essential (primary) hypertension: Secondary | ICD-10-CM

## 2013-04-03 DIAGNOSIS — H43819 Vitreous degeneration, unspecified eye: Secondary | ICD-10-CM | POA: Diagnosis not present

## 2013-04-03 DIAGNOSIS — H2510 Age-related nuclear cataract, unspecified eye: Secondary | ICD-10-CM

## 2013-04-03 DIAGNOSIS — H3530 Unspecified macular degeneration: Secondary | ICD-10-CM

## 2013-04-03 DIAGNOSIS — H35039 Hypertensive retinopathy, unspecified eye: Secondary | ICD-10-CM

## 2013-06-15 DIAGNOSIS — E538 Deficiency of other specified B group vitamins: Secondary | ICD-10-CM | POA: Diagnosis not present

## 2013-06-15 DIAGNOSIS — D649 Anemia, unspecified: Secondary | ICD-10-CM | POA: Diagnosis not present

## 2013-06-15 DIAGNOSIS — I10 Essential (primary) hypertension: Secondary | ICD-10-CM | POA: Diagnosis not present

## 2013-06-15 DIAGNOSIS — M316 Other giant cell arteritis: Secondary | ICD-10-CM | POA: Diagnosis not present

## 2013-06-15 DIAGNOSIS — I776 Arteritis, unspecified: Secondary | ICD-10-CM | POA: Diagnosis not present

## 2013-06-15 DIAGNOSIS — M159 Polyosteoarthritis, unspecified: Secondary | ICD-10-CM | POA: Diagnosis not present

## 2013-06-15 DIAGNOSIS — E7800 Pure hypercholesterolemia, unspecified: Secondary | ICD-10-CM | POA: Diagnosis not present

## 2013-06-15 DIAGNOSIS — R51 Headache: Secondary | ICD-10-CM | POA: Diagnosis not present

## 2013-06-15 DIAGNOSIS — R5381 Other malaise: Secondary | ICD-10-CM | POA: Diagnosis not present

## 2013-07-15 DIAGNOSIS — H43399 Other vitreous opacities, unspecified eye: Secondary | ICD-10-CM | POA: Diagnosis not present

## 2013-07-15 DIAGNOSIS — H402290 Chronic angle-closure glaucoma, unspecified eye, stage unspecified: Secondary | ICD-10-CM | POA: Diagnosis not present

## 2013-07-15 DIAGNOSIS — H353190 Nonexudative age-related macular degeneration, unspecified eye, stage unspecified: Secondary | ICD-10-CM | POA: Diagnosis not present

## 2013-07-15 DIAGNOSIS — H11159 Pinguecula, unspecified eye: Secondary | ICD-10-CM | POA: Diagnosis not present

## 2013-07-15 DIAGNOSIS — H2510 Age-related nuclear cataract, unspecified eye: Secondary | ICD-10-CM | POA: Diagnosis not present

## 2013-07-15 DIAGNOSIS — Z961 Presence of intraocular lens: Secondary | ICD-10-CM | POA: Diagnosis not present

## 2013-07-15 DIAGNOSIS — H35039 Hypertensive retinopathy, unspecified eye: Secondary | ICD-10-CM | POA: Diagnosis not present

## 2013-07-15 DIAGNOSIS — H409 Unspecified glaucoma: Secondary | ICD-10-CM | POA: Diagnosis not present

## 2013-09-14 DIAGNOSIS — M316 Other giant cell arteritis: Secondary | ICD-10-CM | POA: Diagnosis not present

## 2013-09-14 DIAGNOSIS — M159 Polyosteoarthritis, unspecified: Secondary | ICD-10-CM | POA: Diagnosis not present

## 2013-09-14 DIAGNOSIS — E559 Vitamin D deficiency, unspecified: Secondary | ICD-10-CM | POA: Diagnosis not present

## 2013-09-14 DIAGNOSIS — E7800 Pure hypercholesterolemia, unspecified: Secondary | ICD-10-CM | POA: Diagnosis not present

## 2013-09-14 DIAGNOSIS — I10 Essential (primary) hypertension: Secondary | ICD-10-CM | POA: Diagnosis not present

## 2013-10-02 DIAGNOSIS — H409 Unspecified glaucoma: Secondary | ICD-10-CM | POA: Diagnosis not present

## 2013-10-02 DIAGNOSIS — H2510 Age-related nuclear cataract, unspecified eye: Secondary | ICD-10-CM | POA: Diagnosis not present

## 2013-10-02 DIAGNOSIS — H402290 Chronic angle-closure glaucoma, unspecified eye, stage unspecified: Secondary | ICD-10-CM | POA: Diagnosis not present

## 2013-10-21 DIAGNOSIS — L259 Unspecified contact dermatitis, unspecified cause: Secondary | ICD-10-CM | POA: Diagnosis not present

## 2013-11-13 DIAGNOSIS — H409 Unspecified glaucoma: Secondary | ICD-10-CM | POA: Diagnosis not present

## 2013-11-13 DIAGNOSIS — H0019 Chalazion unspecified eye, unspecified eyelid: Secondary | ICD-10-CM | POA: Diagnosis not present

## 2013-11-13 DIAGNOSIS — H402290 Chronic angle-closure glaucoma, unspecified eye, stage unspecified: Secondary | ICD-10-CM | POA: Diagnosis not present

## 2014-02-02 ENCOUNTER — Encounter: Payer: Self-pay | Admitting: Family Medicine

## 2014-02-02 DIAGNOSIS — I10 Essential (primary) hypertension: Secondary | ICD-10-CM | POA: Diagnosis not present

## 2014-02-02 DIAGNOSIS — R51 Headache: Secondary | ICD-10-CM | POA: Diagnosis not present

## 2014-02-02 DIAGNOSIS — E559 Vitamin D deficiency, unspecified: Secondary | ICD-10-CM | POA: Diagnosis not present

## 2014-02-02 DIAGNOSIS — E7800 Pure hypercholesterolemia, unspecified: Secondary | ICD-10-CM | POA: Diagnosis not present

## 2014-02-02 DIAGNOSIS — M316 Other giant cell arteritis: Secondary | ICD-10-CM | POA: Diagnosis not present

## 2014-02-02 DIAGNOSIS — M159 Polyosteoarthritis, unspecified: Secondary | ICD-10-CM | POA: Diagnosis not present

## 2014-02-12 DIAGNOSIS — M549 Dorsalgia, unspecified: Secondary | ICD-10-CM | POA: Diagnosis not present

## 2014-02-19 ENCOUNTER — Ambulatory Visit: Payer: Medicare Other | Admitting: Neurology

## 2014-02-22 ENCOUNTER — Ambulatory Visit: Payer: Medicare Other | Admitting: Neurology

## 2014-02-24 ENCOUNTER — Encounter: Payer: Self-pay | Admitting: Neurology

## 2014-02-24 ENCOUNTER — Ambulatory Visit (INDEPENDENT_AMBULATORY_CARE_PROVIDER_SITE_OTHER): Payer: Medicare Other | Admitting: Neurology

## 2014-02-24 VITALS — BP 135/94 | HR 70 | Ht 66.0 in | Wt 196.0 lb

## 2014-02-24 DIAGNOSIS — J189 Pneumonia, unspecified organism: Secondary | ICD-10-CM | POA: Insufficient documentation

## 2014-02-24 DIAGNOSIS — E7800 Pure hypercholesterolemia, unspecified: Secondary | ICD-10-CM | POA: Diagnosis not present

## 2014-02-24 DIAGNOSIS — R251 Tremor, unspecified: Secondary | ICD-10-CM

## 2014-02-24 DIAGNOSIS — E785 Hyperlipidemia, unspecified: Secondary | ICD-10-CM | POA: Insufficient documentation

## 2014-02-24 DIAGNOSIS — R259 Unspecified abnormal involuntary movements: Secondary | ICD-10-CM

## 2014-02-24 DIAGNOSIS — I10 Essential (primary) hypertension: Secondary | ICD-10-CM | POA: Insufficient documentation

## 2014-02-24 HISTORY — DX: Tremor, unspecified: R25.1

## 2014-02-24 NOTE — Progress Notes (Signed)
[facility]    Provider:  Dr Janann Colonel Referring Provider: Foye Spurling, MD Primary Care Physician:  Foye Spurling, MD  CC:  tremor  HPI:  Lori Wilson is a 79 y.o.right handed female here as a referral from Dr. Carlis Abbott for tremor evaluation. She reports the tremor started after starting the prednisone a few years ago. No progression noted. Tremor is unilateral on the left side but does note an internal generalized tremor. Described as an action/postural tremor, no rest component described. She describes noticing the tremor most often when holding a paper or a cup. Notes some difficulty with her hand writing, has stayed same size but has gotten Retail buyer. Notes slowing down in her walking. Nothing exacerbates the tremor. No change with EtOH. No REM behavior disorder. Normal sense of smell. No family history of tremor. Recently had TSH checked and it was unremarkable. No history of stroke or TIA in the past.   Has history of GCA, currently taking prednisone 10mg  once daily. Notes no headache or visual changes. Has not had biopsy, states it was diagnosed by her eye doctor. Notes aching in her shoulders. Notes generalized muscle aches and fatigue.   Review of Systems: Out of a complete 14 system review, the patient complains of only the following symptoms, and all other reviewed systems are negative. + eye pain, fatigue, weakness, joint pain, decreased energy  History   Social History  . Marital Status: Widowed    Spouse Name: N/A    Number of Children: 4  . Years of Education: 12   Occupational History  .      Retired   Social History Main Topics  . Smoking status: Never Smoker   . Smokeless tobacco: Never Used  . Alcohol Use: No  . Drug Use: No  . Sexual Activity: Not on file   Other Topics Concern  . Not on file   Social History Narrative   Patient is widowed and her grandson stay with her.    Retired.   Education high school.   Right handed.   Caffeine coffee one cup daily.    Family History  Problem Relation Age of Onset  . Stroke Father   . Heart attack Mother   . High blood pressure Father   . Diabetes Mother     Past Medical History  Diagnosis Date  . Hypertension   . High cholesterol   . Pneumonia     Past Surgical History  Procedure Laterality Date  . Bladder surgery    . Vaginal hysterectomy    . Catract Left     Current Outpatient Prescriptions  Medication Sig Dispense Refill  . amLODipine (NORVASC) 5 MG tablet Take 5 mg by mouth daily.      Marland Kitchen aspirin 81 MG tablet Take 81 mg by mouth daily.      Marland Kitchen atorvastatin (LIPITOR) 20 MG tablet Take 20 mg by mouth daily.      . Calcium Carbonate-Vitamin D (CALCIUM 600 + D PO) Take 1 tablet by mouth daily.      . furosemide (LASIX) 20 MG tablet Take 20 mg by mouth 2 (two) times daily.      . Omega-3 Fatty Acids (FISH OIL) 1000 MG CAPS Take by mouth daily.      . predniSONE (DELTASONE) 10 MG tablet Take 5-10 mg by mouth 2 (two) times daily. Take 10mg  in the morning and 5mg  in the evening      . traMADol Veatrice Bourbon)  50 MG tablet Take 50 mg by mouth daily.       No current facility-administered medications for this visit.    Allergies as of 02/24/2014  . (No Known Allergies)    Vitals: BP 135/94  Pulse 70  Ht 5\' 6"  (1.676 m)  Wt 196 lb (88.905 kg)  BMI 31.65 kg/m2 Last Weight:  Wt Readings from Last 1 Encounters:  02/24/14 196 lb (88.905 kg)   Last Height:   Ht Readings from Last 1 Encounters:  02/24/14 5\' 6"  (1.676 m)     Physical exam: Exam: Gen: NAD, conversant Eyes: anicteric sclerae, moist conjunctivae HENT: Atraumatic, oropharynx clear, No temporal tenderness Neck: Trachea midline; supple,  Lungs: CTA, no wheezing, rales, rhonic                          CV: RRR, no MRG Abdomen: Soft, non-tender;  Extremities: No peripheral edema, mild bilateral hand joint swelling  Skin: Normal temperature, no rash,  Psych: Appropriate affect,  pleasant  Neuro: MS: AA&Ox3, appropriately interactive, normal affect   Attention: WORLD backwards  Speech: fluent w/o paraphasic error  Memory: good recent and remote recall  CN: PERRL, EOMI no nystagmus, no ptosis, sensation intact to LT V1-V3 bilat, face symmetric, no weakness, hearing grossly intact, palate elevates symmetrically, shoulder shrug 5/5 bilat,  tongue protrudes midline, no fasiculations noted.  Motor: normal bulk and tone Strength: 5/5  In all extremities with exception of 5-/5 bilateral proximal upper extremity  Coord: minimal rest tremor bilateral hands with distraction L>R, no postural, action or intention tremor bilateral. Mild bradykinesia left hand with finger taps, hand opening. Foot tapping slow bilaterally L>R. Handwriting neat, normal sized, normal Archimedes spiral  Reflexes: symmetrical, bilat downgoing toes  Sens: LT intact in all extremities  Gait: slow, upright, narrow based, good arm swing bilaterally.    Assessment:  After physical and neurologic examination, review of laboratory studies, imaging, neurophysiology testing and pre-existing records, assessment will be reviewed on the problem list.  Plan:  Treatment plan and additional workup will be reviewed under Problem List.  1)Tremor 2)Temporal arteritis 3)Muscle and joint pain  78y/o woman presenting for initial evaluation of left hand tremor which has been present for the past few years. She reports tremor onset coincided with starting prednisone for suspected temporal arteritis. The tremor is predominantly a rest tremor. Unilateral onset of a rest tremor raises concern over possible parkinsonism. She does have some mild bradykinesia on exam though unclear if this is true bradykinesia or slowing of movements related to an underlying rheumatological disorder. Long term steroid use can also contribute to tremor onset. At this time there are some parkinsonian features on exam but not enough to make  a definite diagnosis. History and exam are not consistent with a diagnosis of essential tremor at this time. Of note, she describes diffuse muscle aches/fatigue which are worse proximally. With history of temporal arteritis this raises concern over possible PMR. She would benefit from a formal rheumatological evaluation. Denies any headache or visual changes, therefore will not make any changes to prednisone dose at this time. Patient instructed to contact PCP for rheumatology referral. Will follow up in 6 months to monitor tremor for possible progression.   Jim Like, DO  [facility] [address] [place], [place] [address]  Phone [phone] Fax [phone]

## 2014-03-01 DIAGNOSIS — H353190 Nonexudative age-related macular degeneration, unspecified eye, stage unspecified: Secondary | ICD-10-CM | POA: Diagnosis not present

## 2014-03-01 DIAGNOSIS — H402290 Chronic angle-closure glaucoma, unspecified eye, stage unspecified: Secondary | ICD-10-CM | POA: Diagnosis not present

## 2014-03-01 DIAGNOSIS — H2510 Age-related nuclear cataract, unspecified eye: Secondary | ICD-10-CM | POA: Diagnosis not present

## 2014-03-01 DIAGNOSIS — H25019 Cortical age-related cataract, unspecified eye: Secondary | ICD-10-CM | POA: Diagnosis not present

## 2014-03-30 DIAGNOSIS — M159 Polyosteoarthritis, unspecified: Secondary | ICD-10-CM | POA: Diagnosis not present

## 2014-03-30 DIAGNOSIS — I10 Essential (primary) hypertension: Secondary | ICD-10-CM | POA: Diagnosis not present

## 2014-03-30 DIAGNOSIS — M316 Other giant cell arteritis: Secondary | ICD-10-CM | POA: Diagnosis not present

## 2014-03-30 DIAGNOSIS — E7800 Pure hypercholesterolemia, unspecified: Secondary | ICD-10-CM | POA: Diagnosis not present

## 2014-04-01 DIAGNOSIS — M549 Dorsalgia, unspecified: Secondary | ICD-10-CM | POA: Diagnosis not present

## 2014-04-06 DIAGNOSIS — H2510 Age-related nuclear cataract, unspecified eye: Secondary | ICD-10-CM | POA: Diagnosis not present

## 2014-04-06 HISTORY — PX: OTHER SURGICAL HISTORY: SHX169

## 2014-04-30 DIAGNOSIS — R0602 Shortness of breath: Secondary | ICD-10-CM | POA: Diagnosis not present

## 2014-04-30 DIAGNOSIS — M19011 Primary osteoarthritis, right shoulder: Secondary | ICD-10-CM | POA: Diagnosis not present

## 2014-04-30 DIAGNOSIS — M7500 Adhesive capsulitis of unspecified shoulder: Secondary | ICD-10-CM | POA: Diagnosis not present

## 2014-04-30 DIAGNOSIS — M150 Primary generalized (osteo)arthritis: Secondary | ICD-10-CM | POA: Diagnosis not present

## 2014-04-30 DIAGNOSIS — M25512 Pain in left shoulder: Secondary | ICD-10-CM | POA: Diagnosis not present

## 2014-04-30 DIAGNOSIS — M316 Other giant cell arteritis: Secondary | ICD-10-CM | POA: Diagnosis not present

## 2014-04-30 DIAGNOSIS — I517 Cardiomegaly: Secondary | ICD-10-CM | POA: Diagnosis not present

## 2014-04-30 DIAGNOSIS — R5383 Other fatigue: Secondary | ICD-10-CM | POA: Diagnosis not present

## 2014-05-03 DIAGNOSIS — R0602 Shortness of breath: Secondary | ICD-10-CM | POA: Diagnosis not present

## 2014-05-03 DIAGNOSIS — M25519 Pain in unspecified shoulder: Secondary | ICD-10-CM | POA: Diagnosis not present

## 2014-05-03 DIAGNOSIS — Z23 Encounter for immunization: Secondary | ICD-10-CM | POA: Diagnosis not present

## 2014-05-03 DIAGNOSIS — M316 Other giant cell arteritis: Secondary | ICD-10-CM | POA: Diagnosis not present

## 2014-05-03 DIAGNOSIS — R5382 Chronic fatigue, unspecified: Secondary | ICD-10-CM | POA: Diagnosis not present

## 2014-05-21 DIAGNOSIS — R768 Other specified abnormal immunological findings in serum: Secondary | ICD-10-CM | POA: Diagnosis not present

## 2014-05-21 DIAGNOSIS — R700 Elevated erythrocyte sedimentation rate: Secondary | ICD-10-CM | POA: Diagnosis not present

## 2014-05-21 DIAGNOSIS — E790 Hyperuricemia without signs of inflammatory arthritis and tophaceous disease: Secondary | ICD-10-CM | POA: Diagnosis not present

## 2014-05-21 DIAGNOSIS — M316 Other giant cell arteritis: Secondary | ICD-10-CM | POA: Diagnosis not present

## 2014-06-13 ENCOUNTER — Ambulatory Visit (INDEPENDENT_AMBULATORY_CARE_PROVIDER_SITE_OTHER): Payer: Medicare Other

## 2014-06-13 ENCOUNTER — Ambulatory Visit (INDEPENDENT_AMBULATORY_CARE_PROVIDER_SITE_OTHER): Payer: Medicare Other | Admitting: Family Medicine

## 2014-06-13 VITALS — BP 133/91 | HR 76 | Temp 98.3°F | Resp 20 | Ht 65.0 in | Wt 195.4 lb

## 2014-06-13 DIAGNOSIS — M25561 Pain in right knee: Secondary | ICD-10-CM | POA: Diagnosis not present

## 2014-06-13 NOTE — Progress Notes (Addendum)
   Subjective:    Patient ID: Lori Wilson, female    DOB: 11-02-1934, 79 y.o.   MRN: [id number] This chart was scribed for Robyn Haber, MD by Marti Sleigh, Medical Scribe. This patient was seen in Room  and the patient's care was started a 3:55 PM.  Chief Complaint  Patient presents with  . Leg Pain    catch behind her right knee--x 2 days with some swelling today    HPI HPI Comments: Lori Wilson is a 79 y.o. female with a hx of arthritis, HTN, HLD and possible lupus who presents to [facility] complaining of a persistent leg cramp in the antecubital region that worsens with standing that started yesterday. Pt denies pain with pressure. Pt endorses difficulty walking. Pt's rheumatologist is Dr. Trudie Reed. Pt states she is losing use of her arms and shoulders and has been recently diagnosed with Lupus.     Review of Systems  Constitutional: Positive for activity change.       Difficulty walking.  Musculoskeletal: Positive for myalgias.       Objective:   Physical Exam  Constitutional: She is oriented to person, place, and time. She appears well-developed and well-nourished.  HENT:  Head: Normocephalic and atraumatic.  Eyes: Pupils are equal, round, and reactive to light.  Neck: Neck supple.  Pulmonary/Chest: Effort normal and breath sounds normal. No respiratory distress.  Musculoskeletal: She exhibits no tenderness.  Swelling behind right knee. Full ROM. No localized tenderness.  Neurological: She is alert and oriented to person, place, and time.  Skin: Skin is warm and dry.  Psychiatric: She has a normal mood and affect. Her behavior is normal.  Nursing note and vitals reviewed.  [facility] reading (PRIMARY) by  Dr. Joseph Art:  degenerative changes..    Assessment & Plan:  This chart was scribed in my presence and reviewed by me personally. Right knee pain - Plan: DG Knee 1-2 Views Right Increase prednisone to 20 mg daily for 2 days then 10 mg daily for 2 days and then back to  5 mg daily. Follow-up with Dr. Trudie Reed on December 6 Signed, Robyn Haber, MD

## 2014-06-13 NOTE — Patient Instructions (Signed)
Take 20 mg prednisone today and tomorrow, then 10 mg daily for two days, then back to 5 mg daily.  Follow up with Dr. Trudie Reed on Dec 6

## 2014-07-21 DIAGNOSIS — M316 Other giant cell arteritis: Secondary | ICD-10-CM | POA: Diagnosis not present

## 2014-07-21 DIAGNOSIS — M2550 Pain in unspecified joint: Secondary | ICD-10-CM | POA: Diagnosis not present

## 2014-07-21 DIAGNOSIS — M150 Primary generalized (osteo)arthritis: Secondary | ICD-10-CM | POA: Diagnosis not present

## 2014-07-21 DIAGNOSIS — M1009 Idiopathic gout, multiple sites: Secondary | ICD-10-CM | POA: Diagnosis not present

## 2014-07-28 DIAGNOSIS — H402223 Chronic angle-closure glaucoma, left eye, severe stage: Secondary | ICD-10-CM | POA: Diagnosis not present

## 2014-07-28 DIAGNOSIS — H402211 Chronic angle-closure glaucoma, right eye, mild stage: Secondary | ICD-10-CM | POA: Diagnosis not present

## 2014-07-28 DIAGNOSIS — H169 Unspecified keratitis: Secondary | ICD-10-CM | POA: Diagnosis not present

## 2014-08-18 DIAGNOSIS — M1009 Idiopathic gout, multiple sites: Secondary | ICD-10-CM | POA: Diagnosis not present

## 2014-08-27 ENCOUNTER — Encounter: Payer: Self-pay | Admitting: Nurse Practitioner

## 2014-08-27 ENCOUNTER — Ambulatory Visit (INDEPENDENT_AMBULATORY_CARE_PROVIDER_SITE_OTHER): Payer: Medicare Other | Admitting: Nurse Practitioner

## 2014-08-27 VITALS — BP 143/86 | HR 67 | Ht 66.0 in | Wt 191.4 lb

## 2014-08-27 DIAGNOSIS — R251 Tremor, unspecified: Secondary | ICD-10-CM | POA: Diagnosis not present

## 2014-08-27 NOTE — Patient Instructions (Signed)
Tremor appears stable.  Will f/u in 1 year with Dr. Rexene Alberts parkinson expert at  [facility].

## 2014-08-27 NOTE — Progress Notes (Signed)
[facility]  PATIENT: Lori Wilson DOB: 24-May-1935   REASON FOR VISIT: follow up for tremor HISTORY FROM:patient    HISTORY OF PRESENT ILLNESS:Ms Lori Wilson, 80 year old female returns for follow up. She was initially evaluated by Dr. Janann Colonel 02/24/14. Her tremor waxes and wanes, worse some days than others. Tremor is on the left side. No rest componant described. Denies any difficulty with ambulation except when she had a bout of gout several months ago. No recent falls. No decrease in sence of smell. Notes generalized aches and pain. She returns for reevaluation.   HISTORY: Lori Wilson is a 80 y.o.right handed female here as a referral from Dr. Carlis Abbott for tremor evaluation. She reports the tremor started after starting the prednisone a few years ago. No progression noted. Tremor is unilateral on the left side but does note an internal generalized tremor. Described as an action/postural tremor, no rest component described. She describes noticing the tremor most often when holding a paper or a cup. Notes some difficulty with her hand writing, has stayed same size but has gotten Retail buyer. Notes slowing down in her walking. Nothing exacerbates the tremor. No change with EtOH. No REM behavior disorder. Normal sense of smell. No family history of tremor. Recently had TSH checked and it was unremarkable. No history of stroke or TIA in the past.   Has history of GCA, currently taking prednisone 18m once daily. Notes no headache or visual changes. Has not had biopsy, states it was diagnosed by her eye doctor. Notes aching in her shoulders. Notes generalized muscle aches and fatigue.   REVIEW OF SYSTEMS: Full 14 system review of systems performed and notable only for those listed, all others are neg:  Constitutional: neg  Cardiovascular: neg Ear/Nose/Throat: neg  Skin: neg Eyes: neg Respiratory: neg Gastroitestinal: neg  Hematology/Lymphatic: neg  Endocrine:  neg Musculoskeletal:aching muscles Allergy/Immunology: neg Neurological: neg Psychiatric: neg Sleep : neg   ALLERGIES: No Known Allergies  HOME MEDICATIONS: Outpatient Prescriptions Prior to Visit  Medication Sig Dispense Refill  . amLODipine (NORVASC) 5 MG tablet Take 5 mg by mouth daily.    .Marland Kitchenaspirin 81 MG tablet Take 81 mg by mouth daily.    .Marland Kitchenatorvastatin (LIPITOR) 20 MG tablet Take 10 mg by mouth daily.     . Calcium Carbonate-Vitamin D (CALCIUM 600 + D PO) Take 1 tablet by mouth daily.    . furosemide (LASIX) 20 MG tablet Take 10 mg by mouth 2 (two) times daily.     . Omega-3 Fatty Acids (FISH OIL) 1000 MG CAPS Take by mouth daily.    . predniSONE (DELTASONE) 10 MG tablet Take 5-10 mg by mouth 2 (two) times daily. Take 167min the morning and 24m40mn the evening    . traMADol (ULTRAM) 50 MG tablet Take 50 mg by mouth daily.     No facility-administered medications prior to visit.    PAST MEDICAL HISTORY: Past Medical History  Diagnosis Date  . Hypertension   . High cholesterol   . Pneumonia     PAST SURGICAL HISTORY: Past Surgical History  Procedure Laterality Date  . Bladder surgery    . Vaginal hysterectomy    . Catract Left 04/06/2014    FAMILY HISTORY: Family History  Problem Relation Age of Onset  . Stroke Father   . Heart attack Mother   . High blood pressure Father   . Diabetes Mother     SOCIAL HISTORY: History   Social  History  . Marital Status: Widowed    Spouse Name: N/A  . Number of Children: 4  . Years of Education: 12   Occupational History  .      Retired   Social History Main Topics  . Smoking status: Never Smoker   . Smokeless tobacco: Never Used  . Alcohol Use: No  . Drug Use: No  . Sexual Activity: Not on file   Other Topics Concern  . Not on file   Social History Narrative   Patient is widowed and her grandson stay with her.    Retired.   Education high school.   Right handed.   Caffeine coffee one cup daily.      PHYSICAL EXAM  Filed Vitals:   08/27/14 0857  BP: 143/86  Pulse: 67  Height: '5\' 6"'  (1.676 m)  Weight: 191 lb 6.4 oz (86.818 kg)   Body mass index is 30.91 kg/(m^2).  Generalized: Well developed, in no acute distress , no masking of the face, negative myerson's sign Head: normocephalic and atraumatic,. Oropharynx benign  Neck: Supple, no carotid bruits  Cardiac: Regular rate rhythm, no murmur  Musculoskeletal: No deformity mild joint swelling  Neurological examination   Mentation: Alert oriented to time, place, history taking. Attention span and concentration appropriate. Recent and remote memory intact.  Follows all commands speech and language fluent.  CNPupils were equal round reactive to light extraocular movements were full, visual field were full on confrontational test. Facial sensation and strength were normal. hearing was intact to finger rubbing bilaterally. Uvula tongue midline. head turning and shoulder shrug were normal and symmetric.Tongue protrusion into cheek strength was normal. Motor: normal bulk and tone, full strength in the BUE, BLE, fine finger movements normal, no pronator drift. No focal weakness Sensory: normal and symmetric to light touch, pinprick, and  Vibration, proprioception  Coordination: finger-nose-finger, heel-to-shin bilaterally, no dysmetria. Finger taps and hand opening with our bradykinesia. Handwriting neat.  Reflexes: Brachioradialis 2/2, biceps 2/2, triceps 2/2, patellar 2/2, Achilles 2/2, plantar responses were flexor bilaterally. Gait and Station: Rising up from seated position without assistance, narrow based  moderate stride, good arm swing, smooth turning,  DIAGNOSTIC DATA (LABS, IMAGING, TESTING) - I reviewed patient records, labs, notes, testing and imaging myself where available.  No results found for: WBC, HGB, HCT, MCV, PLT    Component Value Date/Time   CREATININE 1.25* 08/11/2010 1133   GFRNONAA 42* 08/11/2010 1133   GFRAA  * 08/11/2010 1133    51        The eGFR has been calculated using the MDRD equation. This calculation has not been validated in all clinical situations. eGFR's persistently <60 mL/min signify possible Chronic Kidney Disease.    ASSESSMENT AND PLAN  80 y.o. year old female  has a past medical history of tremors, temporal arteritis and aching muscles. Tremor is stable. No bradykinesia   Will f/u in 1 year with Dr. Rexene Alberts parkinson expert at  our practice. This note is sent to [facility] since Dr. Janann Colonel has left the practice.  Dennie Bible, [facility], [facility], APRN  [facility] [address], [place], [place] [address] [phone]

## 2014-08-27 NOTE — Progress Notes (Signed)
I agree with the assessment and plan as directed by NP .The patient is known to me .   Kiala Faraj, MD  

## 2014-09-02 DIAGNOSIS — H402211 Chronic angle-closure glaucoma, right eye, mild stage: Secondary | ICD-10-CM | POA: Diagnosis not present

## 2014-09-02 DIAGNOSIS — H40052 Ocular hypertension, left eye: Secondary | ICD-10-CM | POA: Diagnosis not present

## 2014-09-02 DIAGNOSIS — H402223 Chronic angle-closure glaucoma, left eye, severe stage: Secondary | ICD-10-CM | POA: Diagnosis not present

## 2014-10-04 DIAGNOSIS — H8113 Benign paroxysmal vertigo, bilateral: Secondary | ICD-10-CM | POA: Diagnosis not present

## 2014-10-04 DIAGNOSIS — I10 Essential (primary) hypertension: Secondary | ICD-10-CM | POA: Diagnosis not present

## 2014-10-04 DIAGNOSIS — I776 Arteritis, unspecified: Secondary | ICD-10-CM | POA: Diagnosis not present

## 2014-10-04 DIAGNOSIS — E780 Pure hypercholesterolemia: Secondary | ICD-10-CM | POA: Diagnosis not present

## 2014-10-06 DIAGNOSIS — M7500 Adhesive capsulitis of unspecified shoulder: Secondary | ICD-10-CM | POA: Diagnosis not present

## 2014-10-06 DIAGNOSIS — M150 Primary generalized (osteo)arthritis: Secondary | ICD-10-CM | POA: Diagnosis not present

## 2014-10-06 DIAGNOSIS — M1009 Idiopathic gout, multiple sites: Secondary | ICD-10-CM | POA: Diagnosis not present

## 2014-10-06 DIAGNOSIS — M316 Other giant cell arteritis: Secondary | ICD-10-CM | POA: Diagnosis not present

## 2014-11-26 DIAGNOSIS — H402211 Chronic angle-closure glaucoma, right eye, mild stage: Secondary | ICD-10-CM | POA: Diagnosis not present

## 2014-11-26 DIAGNOSIS — H402223 Chronic angle-closure glaucoma, left eye, severe stage: Secondary | ICD-10-CM | POA: Diagnosis not present

## 2014-11-26 DIAGNOSIS — H3531 Nonexudative age-related macular degeneration: Secondary | ICD-10-CM | POA: Diagnosis not present

## 2014-11-26 DIAGNOSIS — Z961 Presence of intraocular lens: Secondary | ICD-10-CM | POA: Diagnosis not present

## 2014-12-06 DIAGNOSIS — L309 Dermatitis, unspecified: Secondary | ICD-10-CM | POA: Diagnosis not present

## 2015-01-05 ENCOUNTER — Encounter: Payer: Self-pay | Admitting: Family Medicine

## 2015-01-05 DIAGNOSIS — H8113 Benign paroxysmal vertigo, bilateral: Secondary | ICD-10-CM | POA: Diagnosis not present

## 2015-01-05 DIAGNOSIS — I10 Essential (primary) hypertension: Secondary | ICD-10-CM | POA: Diagnosis not present

## 2015-01-05 DIAGNOSIS — I776 Arteritis, unspecified: Secondary | ICD-10-CM | POA: Diagnosis not present

## 2015-01-05 DIAGNOSIS — E780 Pure hypercholesterolemia: Secondary | ICD-10-CM | POA: Diagnosis not present

## 2015-01-05 DIAGNOSIS — R1012 Left upper quadrant pain: Secondary | ICD-10-CM | POA: Diagnosis not present

## 2015-01-06 DIAGNOSIS — M1009 Idiopathic gout, multiple sites: Secondary | ICD-10-CM | POA: Diagnosis not present

## 2015-01-06 DIAGNOSIS — M316 Other giant cell arteritis: Secondary | ICD-10-CM | POA: Diagnosis not present

## 2015-01-06 DIAGNOSIS — R768 Other specified abnormal immunological findings in serum: Secondary | ICD-10-CM | POA: Diagnosis not present

## 2015-01-06 DIAGNOSIS — M150 Primary generalized (osteo)arthritis: Secondary | ICD-10-CM | POA: Diagnosis not present

## 2015-02-02 DIAGNOSIS — L308 Other specified dermatitis: Secondary | ICD-10-CM | POA: Diagnosis not present

## 2015-02-02 DIAGNOSIS — D485 Neoplasm of uncertain behavior of skin: Secondary | ICD-10-CM | POA: Diagnosis not present

## 2015-02-02 DIAGNOSIS — L309 Dermatitis, unspecified: Secondary | ICD-10-CM | POA: Diagnosis not present

## 2015-03-03 DIAGNOSIS — H402211 Chronic angle-closure glaucoma, right eye, mild stage: Secondary | ICD-10-CM | POA: Diagnosis not present

## 2015-03-03 DIAGNOSIS — H402223 Chronic angle-closure glaucoma, left eye, severe stage: Secondary | ICD-10-CM | POA: Diagnosis not present

## 2015-03-14 DIAGNOSIS — L309 Dermatitis, unspecified: Secondary | ICD-10-CM | POA: Diagnosis not present

## 2015-03-16 DIAGNOSIS — Z029 Encounter for administrative examinations, unspecified: Secondary | ICD-10-CM | POA: Diagnosis not present

## 2015-03-17 DIAGNOSIS — L309 Dermatitis, unspecified: Secondary | ICD-10-CM | POA: Diagnosis not present

## 2015-04-26 ENCOUNTER — Emergency Department (HOSPITAL_COMMUNITY): Payer: Medicare Other

## 2015-04-26 ENCOUNTER — Encounter (HOSPITAL_COMMUNITY): Payer: Self-pay

## 2015-04-26 ENCOUNTER — Emergency Department (HOSPITAL_COMMUNITY)
Admission: EM | Admit: 2015-04-26 | Discharge: 2015-04-26 | Disposition: A | Discharge status: Home / Self Care | Payer: Medicare Other | Attending: Emergency Medicine | Admitting: Emergency Medicine

## 2015-04-26 DIAGNOSIS — M5442 Lumbago with sciatica, left side: Secondary | ICD-10-CM

## 2015-04-26 DIAGNOSIS — I10 Essential (primary) hypertension: Secondary | ICD-10-CM | POA: Diagnosis not present

## 2015-04-26 DIAGNOSIS — Z79899 Other long term (current) drug therapy: Secondary | ICD-10-CM | POA: Insufficient documentation

## 2015-04-26 DIAGNOSIS — E782 Mixed hyperlipidemia: Secondary | ICD-10-CM | POA: Diagnosis not present

## 2015-04-26 DIAGNOSIS — Z8701 Personal history of pneumonia (recurrent): Secondary | ICD-10-CM | POA: Diagnosis not present

## 2015-04-26 DIAGNOSIS — M5440 Lumbago with sciatica, unspecified side: Secondary | ICD-10-CM | POA: Insufficient documentation

## 2015-04-26 DIAGNOSIS — Z7982 Long term (current) use of aspirin: Secondary | ICD-10-CM | POA: Insufficient documentation

## 2015-04-26 DIAGNOSIS — M5441 Lumbago with sciatica, right side: Secondary | ICD-10-CM

## 2015-04-26 DIAGNOSIS — M545 Low back pain: Secondary | ICD-10-CM | POA: Diagnosis present

## 2015-04-26 LAB — URINALYSIS, ROUTINE W REFLEX MICROSCOPIC
BILIRUBIN URINE: NEGATIVE
Glucose, UA: NEGATIVE mg/dL
Hgb urine dipstick: NEGATIVE
Ketones, ur: NEGATIVE mg/dL
Leukocytes, UA: NEGATIVE
NITRITE: NEGATIVE
PROTEIN: NEGATIVE mg/dL
SPECIFIC GRAVITY, URINE: 1.023 (ref 1.005–1.030)
UROBILINOGEN UA: 1 mg/dL (ref 0.0–1.0)
pH: 6.5 (ref 5.0–8.0)

## 2015-04-26 LAB — CBC WITH DIFFERENTIAL/PLATELET
BASOS ABS: 0 10*3/uL (ref 0.0–0.1)
BASOS PCT: 0 %
EOS ABS: 0.3 10*3/uL (ref 0.0–0.7)
Eosinophils Relative: 4 %
HCT: 36.9 % (ref 36.0–46.0)
HEMOGLOBIN: 11.7 g/dL — AB (ref 12.0–15.0)
LYMPHS ABS: 3.4 10*3/uL (ref 0.7–4.0)
Lymphocytes Relative: 41 %
MCH: 27.8 pg (ref 26.0–34.0)
MCHC: 31.7 g/dL (ref 30.0–36.0)
MCV: 87.6 fL (ref 78.0–100.0)
Monocytes Absolute: 0.7 10*3/uL (ref 0.1–1.0)
Monocytes Relative: 9 %
NEUTROS PCT: 46 %
Neutro Abs: 3.8 10*3/uL (ref 1.7–7.7)
PLATELETS: 265 10*3/uL (ref 150–400)
RBC: 4.21 MIL/uL (ref 3.87–5.11)
RDW: 14 % (ref 11.5–15.5)
WBC: 8.2 10*3/uL (ref 4.0–10.5)

## 2015-04-26 LAB — BASIC METABOLIC PANEL
ANION GAP: 10 (ref 5–15)
BUN: 16 mg/dL (ref 6–20)
CHLORIDE: 102 mmol/L (ref 101–111)
CO2: 28 mmol/L (ref 22–32)
Calcium: 9.6 mg/dL (ref 8.9–10.3)
Creatinine, Ser: 1.03 mg/dL — ABNORMAL HIGH (ref 0.44–1.00)
GFR, EST AFRICAN AMERICAN: 59 mL/min — AB (ref >60)
GFR, EST NON AFRICAN AMERICAN: 51 mL/min — AB (ref >60)
Glucose, Bld: 93 mg/dL (ref 65–99)
POTASSIUM: 3.3 mmol/L — AB (ref 3.5–5.1)
SODIUM: 140 mmol/L (ref 135–145)

## 2015-04-26 MED ORDER — ACETAMINOPHEN 500 MG PO TABS
1000.0000 mg | ORAL_TABLET | Freq: Once | ORAL | Status: AC
  Administered 2015-04-26: 1000 mg via ORAL
  Filled 2015-04-26: qty 2

## 2015-04-26 MED ORDER — OXYCODONE HCL 5 MG PO TABS
2.5000 mg | ORAL_TABLET | Freq: Once | ORAL | Status: AC
  Administered 2015-04-26: 2.5 mg via ORAL
  Filled 2015-04-26: qty 1

## 2015-04-26 NOTE — ED Notes (Signed)
Post void residual was 33 ml.

## 2015-04-26 NOTE — ED Notes (Signed)
Pt. Is aware of needing an urine specimen, she states, "I cannot go yet."

## 2015-04-26 NOTE — Discharge Instructions (Signed)

## 2015-04-26 NOTE — ED Provider Notes (Signed)
CSN: [id number]     Arrival date & time 04/26/15  4403 History   First MD Initiated Contact with Patient 04/26/15 0700     Chief Complaint  Patient presents with  . Back Pain     (Consider location/radiation/quality/duration/timing/severity/associated sxs/prior Treatment) Patient is a 80 y.o. female presenting with back pain. The history is provided by the patient.  Back Pain Location:  Lumbar spine Quality:  Burning and cramping Radiates to:  L posterior upper leg, R posterior upper leg, L knee and R knee Pain severity:  Moderate Pain is:  Same all the time Onset quality:  Gradual Duration:  1 week Timing:  Constant Progression:  Worsening Chronicity:  New Context: lifting heavy objects   Relieved by:  Nothing Worsened by:  Movement, palpation and lying down Ineffective treatments:  None tried Associated symptoms: no chest pain, no dysuria, no fever and no headaches   Risk factors: lack of exercise and obesity   Risk factors: no hx of cancer    80 yo F with a chief complaint of lower back pain. This been going on for about a week. Worse when she lays back flat. Radiates down to both legs. Also pain with movement palpation. Patient is not tried anything for this home. Has not seen her doctor for this. Denies injury. Patient states that she lifted a heavy flower pot prior to this starting. Denies fevers or chills. Denies difficulty with bowel or bladder. Denies loss perirectal sensation. Denies lower extremity weakness.  Past Medical History  Diagnosis Date  . Hypertension   . High cholesterol   . Pneumonia    Past Surgical History  Procedure Laterality Date  . Bladder surgery    . Vaginal hysterectomy    . Catract Left 04/06/2014   Family History  Problem Relation Age of Onset  . Stroke Father   . Heart attack Mother   . High blood pressure Father   . Diabetes Mother    Social History  Substance Use Topics  . Smoking status: Never Smoker   . Smokeless tobacco:  Never Used  . Alcohol Use: No   OB History    No data available     Review of Systems  Constitutional: Negative for fever and chills.  HENT: Negative for congestion and rhinorrhea.   Eyes: Negative for redness and visual disturbance.  Respiratory: Negative for shortness of breath and wheezing.   Cardiovascular: Negative for chest pain and palpitations.  Gastrointestinal: Negative for nausea and vomiting.  Genitourinary: Negative for dysuria and urgency.  Musculoskeletal: Positive for back pain. Negative for myalgias and arthralgias.  Skin: Negative for pallor and wound.  Neurological: Negative for dizziness and headaches.      Allergies  Review of patient's allergies indicates no known allergies.  Home Medications   Prior to Admission medications   Medication Sig Start Date End Date Taking? Authorizing Provider  acetaminophen (TYLENOL) 325 MG tablet Take 650 mg by mouth every 6 (six) hours as needed for mild pain.   Yes Historical Provider, MD  allopurinol (ZYLOPRIM) 100 MG tablet Take 1 tablet by mouth daily. 08/23/14  Yes Historical Provider, MD  amLODipine (NORVASC) 5 MG tablet Take 5 mg by mouth daily.   Yes Historical Provider, MD  aspirin 81 MG tablet Take 81 mg by mouth daily.   Yes Historical Provider, MD  atorvastatin (LIPITOR) 10 MG tablet Take 10 mg by mouth daily.   Yes Historical Provider, MD  Calcium Carbonate-Vitamin D (CALCIUM 600 + D  PO) Take 1 tablet by mouth daily.   Yes Historical Provider, MD  dorzolamide (TRUSOPT) 2 % ophthalmic solution Place 1 drop into both eyes 2 (two) times daily.   Yes Historical Provider, MD  furosemide (LASIX) 20 MG tablet Take 20 mg by mouth daily.    Yes Historical Provider, MD  hydroxypropyl methylcellulose / hypromellose (ISOPTO TEARS / GONIOVISC) 2.5 % ophthalmic solution Place 1 drop into both eyes 3 (three) times daily as needed for dry eyes.   Yes Historical Provider, MD  Omega-3 Fatty Acids (FISH OIL) 1000 MG CAPS Take 1,000  mg by mouth daily.    Yes Historical Provider, MD  predniSONE (DELTASONE) 5 MG tablet Take 5 mg by mouth daily with breakfast.   Yes Historical Provider, MD   BP 137/80 mmHg  Pulse 60  Temp(Src) 98.2 F (36.8 C) (Oral)  Resp 16  Ht 5\' 6"  (1.676 m)  Wt 183 lb (83.008 kg)  BMI 29.55 kg/m2  SpO2 100% Physical Exam  Constitutional: She is oriented to person, place, and time. She appears well-developed and well-nourished. No distress.  HENT:  Head: Normocephalic and atraumatic.  Eyes: EOM are normal. Pupils are equal, round, and reactive to light.  Neck: Normal range of motion. Neck supple.  Cardiovascular: Normal rate and regular rhythm.  Exam reveals no gallop and no friction rub.   No murmur heard. Pulmonary/Chest: Effort normal. She has no wheezes. She has no rales.  Abdominal: Soft. She exhibits no distension. There is no tenderness. There is no rebound and no guarding.  Musculoskeletal: She exhibits tenderness. She exhibits no edema.  Neurological: She is alert and oriented to person, place, and time.  Reflex Scores:      Patellar reflexes are 2+ on the right side and 2+ on the left side.      Achilles reflexes are 2+ on the right side and 2+ on the left side. No noted clonus. Normal reflexes. Able to ambulate without difficulty. Motor 5 out of 5 bilaterally.  Skin: Skin is warm and dry. She is not diaphoretic.  Psychiatric: She has a normal mood and affect. Her behavior is normal.    ED Course  Procedures (including critical care time) Labs Review Labs Reviewed  CBC WITH DIFFERENTIAL/PLATELET - Abnormal; Notable for the following:    Hemoglobin 11.7 (*)    All other components within normal limits  BASIC METABOLIC PANEL - Abnormal; Notable for the following:    Potassium 3.3 (*)    Creatinine, Ser 1.03 (*)    GFR calc non Af Amer 51 (*)    GFR calc Af Amer 59 (*)    All other components within normal limits  URINALYSIS, ROUTINE W REFLEX MICROSCOPIC (NOT AT [facility])     Imaging Review Dg Lumbar Spine Complete  04/26/2015   CLINICAL DATA:  Sharp lower back pain, radiating to bilateral legs.  EXAM: LUMBAR SPINE - COMPLETE 4+ VIEW  COMPARISON:  07/28/2008  FINDINGS: No evidence of acute fracture or subluxation. No focal bone lesion or bone destruction. Vertebral body height is maintained. There are multilevel osteoarthritic changes worse in the mid and lower lumbosacral spine with severe posterior facet arthropathy in the lower lumbosacral spine. There is subsequent narrowing of bilateral bony neural foramina of L3-L4, L4-L5 and L5-S1. Minimal anterolisthesis of L3 on L4.  Evaluation of the soft tissues demonstrate atherosclerotic disease of the aorta and iliac arteries.  IMPRESSION: Multilevel osteoarthritic changes with severe posterior facet arthropathy in the lower lumbosacral spine, and  subsequent narrowing of bilateral neural foramina.  Minimal anterolisthesis of L3 on L4, likely degenerative.  Atherosclerotic disease of the aorta and iliac arteries.   Electronically Signed   By: Fidela Salisbury M.D.   On: 04/26/2015 08:08   Mr Lumbar Spine Wo Contrast  04/26/2015   CLINICAL DATA:  Low back pain radiating down both legs.  EXAM: MRI LUMBAR SPINE WITHOUT CONTRAST  TECHNIQUE: Multiplanar, multisequence MR imaging of the lumbar spine was performed. No intravenous contrast was administered.  COMPARISON:  04/26/2015  FINDINGS: The lowest lumbar type non-rib-bearing vertebra is labeled as L5. The conus medullaris appears normal. Conus level: T12-L1.  There is 4 mm grade 1 anterolisthesis at L3-4.  No pars defects.  Intervertebral disc desiccation is observed at all levels in the lumbar spine with loss of disc height and vacuum disc phenomenon at L4-5.  Mild degenerative facet and perifacet edema on the right at L3-4 with facet joint effusion.  A fluid signal intensity lesion of the right kidney is partially included on today's exam and statistically likely to represent  a cyst although technically nonspecific.  A 2.1 by 1.5 cm lesion with high T2 and high T1 signal characteristics is present along the right external iliac vasculature, image 34 series 6, anterior to the right ureter.  Mild prominence of epidural adipose tissue in the lower lumbar spine. Additional findings at individual levels are as follows:  L1-2:  No impingement.  Right facet arthropathy.  L2-3: Mild bilateral foraminal stenosis and mild displacement of both L2 nerves in the lateral extraforaminal space due to disc bulge and facet arthropathy. Borderline central narrowing of the thecal sac due to encroachment from the facets.  L3-4: Moderate central narrowing of the thecal sac with mild to moderate right and mild left foraminal stenosis and borderline bilateral subarticular lateral recess stenosis due to right greater than left facet arthropathy, disc uncovering, and disc bulge. Right facet joint effusion.  L4-5: Prominent right and moderate left subarticular lateral recess stenosis with moderate central narrowing of the thecal sac and mild bilateral foraminal stenosis due to right lateral recess disc protrusion, questionable adjacent fragment, facet arthropathy, and disc bulge.  L5-S1: No impingement. Bilateral facet arthropathy. Mild left eccentric disc bulge.  IMPRESSION: 1. Lumbar spondylosis and degenerative disc disease, causing prominent impingement at L4-5, moderate impingement at L3-4, and mild impingement at L2- 3, as detailed above. The dominant finding is the right lateral recess disc protrusion at L4-5. Cannot exclude in adjacent disc fragment. 2. Unusual 2.1 by 1.5 cm lesion with high T2 and high T1 signal characteristics adjacent to the right external iliac vessels in just anterior to the right distal ureter. With the high T1 and T2 signal this could be a proteinaceous cystic lesion or a fatty lesion such as a small ovarian dermoid. No prior cross-sectional imaging through this region for  comparison purposes. In the absence of a history of melanoma, I consider this structure highly likely to be benign. If clinically warranted, pelvic sonography or pelvic MRI could be utilized for further characterization.   Electronically Signed   By: Van Clines M.D.   On: 04/26/2015 10:35   I have personally reviewed and evaluated these images and lab results as part of my medical decision-making.   EKG Interpretation None      MDM   Final diagnoses:  Bilateral low back pain with sciatica, sciatica laterality unspecified    80 yo F with a chief complaint of lower back pain that radiates  to both legs. Concern for possible early cord compression with bilateral radiation. We'll obtain an MRI of the L-spine.  MRI with degenerative disc disease with impingement at L4-L5. Patient pain well controlled, continues to have no noted weakness.  Will have follow up with PCP, strict return precautions.    1:14 PM:  I have discussed the diagnosis/risks/treatment options with the patient and believe the pt to be eligible for discharge home to follow-up with PCP. We also discussed returning to the ED immediately if new or worsening sx occur. We discussed the sx which are most concerning (e.g., sudden worsening pain, fever, cauda equina sx) that necessitate immediate return. Medications administered to the patient during their visit and any new prescriptions provided to the patient are listed below.  Medications given during this visit Medications  acetaminophen (TYLENOL) tablet 1,000 mg (1,000 mg Oral Given 04/26/15 0750)  oxyCODONE (Oxy IR/ROXICODONE) immediate release tablet 2.5 mg (2.5 mg Oral Given 04/26/15 1051)    Discharge Medication List as of 04/26/2015 11:11 AM      The patient appears reasonably screen and/or stabilized for discharge and I doubt any other medical condition or other [facility] requiring further screening, evaluation, or treatment in the ED at this time prior to discharge.     Deno Etienne, DO 04/26/15 1315

## 2015-04-26 NOTE — ED Notes (Signed)
Pt. Moved a flower pot 1 week ago and developed lower back pain which radiates into both leg.   Pt.  Denies any pain

## 2015-04-26 NOTE — ED Notes (Signed)
Pt. Waiting for her son to come back.  He took her clothes, purse, cell phone.  Asked pt. If she would like anything to eat.  She stated, "I will wait till after we leave."

## 2015-04-27 DIAGNOSIS — I10 Essential (primary) hypertension: Secondary | ICD-10-CM | POA: Diagnosis not present

## 2015-04-27 DIAGNOSIS — M5413 Radiculopathy, cervicothoracic region: Secondary | ICD-10-CM | POA: Diagnosis not present

## 2015-04-27 DIAGNOSIS — I776 Arteritis, unspecified: Secondary | ICD-10-CM | POA: Diagnosis not present

## 2015-04-27 DIAGNOSIS — E876 Hypokalemia: Secondary | ICD-10-CM | POA: Diagnosis not present

## 2015-05-11 DIAGNOSIS — E781 Pure hyperglyceridemia: Secondary | ICD-10-CM | POA: Diagnosis not present

## 2015-05-11 DIAGNOSIS — I10 Essential (primary) hypertension: Secondary | ICD-10-CM | POA: Diagnosis not present

## 2015-05-11 DIAGNOSIS — I776 Arteritis, unspecified: Secondary | ICD-10-CM | POA: Diagnosis not present

## 2015-05-11 DIAGNOSIS — M549 Dorsalgia, unspecified: Secondary | ICD-10-CM | POA: Diagnosis not present

## 2015-05-23 DIAGNOSIS — M150 Primary generalized (osteo)arthritis: Secondary | ICD-10-CM | POA: Diagnosis not present

## 2015-05-23 DIAGNOSIS — M316 Other giant cell arteritis: Secondary | ICD-10-CM | POA: Diagnosis not present

## 2015-05-23 DIAGNOSIS — M1009 Idiopathic gout, multiple sites: Secondary | ICD-10-CM | POA: Diagnosis not present

## 2015-05-23 DIAGNOSIS — R768 Other specified abnormal immunological findings in serum: Secondary | ICD-10-CM | POA: Diagnosis not present

## 2015-06-15 DIAGNOSIS — H1013 Acute atopic conjunctivitis, bilateral: Secondary | ICD-10-CM | POA: Diagnosis not present

## 2015-06-15 DIAGNOSIS — H402211 Chronic angle-closure glaucoma, right eye, mild stage: Secondary | ICD-10-CM | POA: Diagnosis not present

## 2015-06-15 DIAGNOSIS — H04123 Dry eye syndrome of bilateral lacrimal glands: Secondary | ICD-10-CM | POA: Diagnosis not present

## 2015-06-15 DIAGNOSIS — H402223 Chronic angle-closure glaucoma, left eye, severe stage: Secondary | ICD-10-CM | POA: Diagnosis not present

## 2015-06-29 DIAGNOSIS — I10 Essential (primary) hypertension: Secondary | ICD-10-CM | POA: Diagnosis not present

## 2015-06-29 DIAGNOSIS — M549 Dorsalgia, unspecified: Secondary | ICD-10-CM | POA: Diagnosis not present

## 2015-06-29 DIAGNOSIS — E781 Pure hyperglyceridemia: Secondary | ICD-10-CM | POA: Diagnosis not present

## 2015-06-29 DIAGNOSIS — I776 Arteritis, unspecified: Secondary | ICD-10-CM | POA: Diagnosis not present

## 2015-08-10 DIAGNOSIS — M1009 Idiopathic gout, multiple sites: Secondary | ICD-10-CM | POA: Diagnosis not present

## 2015-08-10 DIAGNOSIS — M316 Other giant cell arteritis: Secondary | ICD-10-CM | POA: Diagnosis not present

## 2015-08-10 DIAGNOSIS — R768 Other specified abnormal immunological findings in serum: Secondary | ICD-10-CM | POA: Diagnosis not present

## 2015-08-10 DIAGNOSIS — M150 Primary generalized (osteo)arthritis: Secondary | ICD-10-CM | POA: Diagnosis not present

## 2015-08-22 DIAGNOSIS — I10 Essential (primary) hypertension: Secondary | ICD-10-CM | POA: Diagnosis not present

## 2015-08-22 DIAGNOSIS — M549 Dorsalgia, unspecified: Secondary | ICD-10-CM | POA: Diagnosis not present

## 2015-08-22 DIAGNOSIS — I776 Arteritis, unspecified: Secondary | ICD-10-CM | POA: Diagnosis not present

## 2015-08-22 DIAGNOSIS — E781 Pure hyperglyceridemia: Secondary | ICD-10-CM | POA: Diagnosis not present

## 2015-08-31 ENCOUNTER — Ambulatory Visit: Payer: Medicare Other | Admitting: Neurology

## 2015-09-13 ENCOUNTER — Ambulatory Visit
Admission: RE | Admit: 2015-09-13 | Discharge: 2015-09-13 | Disposition: A | Discharge status: Home / Self Care | Payer: Medicare Other | Source: Ambulatory Visit | Attending: Rheumatology | Admitting: Rheumatology

## 2015-09-13 ENCOUNTER — Other Ambulatory Visit: Payer: Self-pay | Admitting: Rheumatology

## 2015-09-13 DIAGNOSIS — M1009 Idiopathic gout, multiple sites: Secondary | ICD-10-CM | POA: Diagnosis not present

## 2015-09-13 DIAGNOSIS — M7989 Other specified soft tissue disorders: Secondary | ICD-10-CM | POA: Diagnosis not present

## 2015-09-13 DIAGNOSIS — M316 Other giant cell arteritis: Secondary | ICD-10-CM | POA: Diagnosis not present

## 2015-09-13 DIAGNOSIS — R768 Other specified abnormal immunological findings in serum: Secondary | ICD-10-CM | POA: Diagnosis not present

## 2015-09-13 DIAGNOSIS — R52 Pain, unspecified: Secondary | ICD-10-CM

## 2015-09-13 DIAGNOSIS — M150 Primary generalized (osteo)arthritis: Secondary | ICD-10-CM | POA: Diagnosis not present

## 2015-09-20 DIAGNOSIS — M150 Primary generalized (osteo)arthritis: Secondary | ICD-10-CM | POA: Diagnosis not present

## 2015-09-20 DIAGNOSIS — M1009 Idiopathic gout, multiple sites: Secondary | ICD-10-CM | POA: Diagnosis not present

## 2015-09-20 DIAGNOSIS — R768 Other specified abnormal immunological findings in serum: Secondary | ICD-10-CM | POA: Diagnosis not present

## 2015-09-20 DIAGNOSIS — M25569 Pain in unspecified knee: Secondary | ICD-10-CM | POA: Diagnosis not present

## 2015-09-20 DIAGNOSIS — M316 Other giant cell arteritis: Secondary | ICD-10-CM | POA: Diagnosis not present

## 2015-11-02 ENCOUNTER — Encounter: Payer: Self-pay | Admitting: Family Medicine

## 2015-11-02 DIAGNOSIS — Z1211 Encounter for screening for malignant neoplasm of colon: Secondary | ICD-10-CM | POA: Diagnosis not present

## 2015-11-02 DIAGNOSIS — K5730 Diverticulosis of large intestine without perforation or abscess without bleeding: Secondary | ICD-10-CM | POA: Diagnosis not present

## 2015-12-21 ENCOUNTER — Encounter: Payer: Self-pay | Admitting: Family Medicine

## 2015-12-21 DIAGNOSIS — M1009 Idiopathic gout, multiple sites: Secondary | ICD-10-CM | POA: Diagnosis not present

## 2015-12-21 DIAGNOSIS — Z7952 Long term (current) use of systemic steroids: Secondary | ICD-10-CM | POA: Diagnosis not present

## 2015-12-21 DIAGNOSIS — R768 Other specified abnormal immunological findings in serum: Secondary | ICD-10-CM | POA: Diagnosis not present

## 2015-12-21 DIAGNOSIS — M316 Other giant cell arteritis: Secondary | ICD-10-CM | POA: Diagnosis not present

## 2015-12-21 DIAGNOSIS — M150 Primary generalized (osteo)arthritis: Secondary | ICD-10-CM | POA: Diagnosis not present

## 2015-12-26 DIAGNOSIS — M1029 Drug-induced gout, multiple sites: Secondary | ICD-10-CM | POA: Diagnosis not present

## 2015-12-26 DIAGNOSIS — M5416 Radiculopathy, lumbar region: Secondary | ICD-10-CM | POA: Diagnosis not present

## 2015-12-26 DIAGNOSIS — M549 Dorsalgia, unspecified: Secondary | ICD-10-CM | POA: Diagnosis not present

## 2015-12-26 DIAGNOSIS — E781 Pure hyperglyceridemia: Secondary | ICD-10-CM | POA: Diagnosis not present

## 2015-12-30 DIAGNOSIS — H402223 Chronic angle-closure glaucoma, left eye, severe stage: Secondary | ICD-10-CM | POA: Diagnosis not present

## 2015-12-30 DIAGNOSIS — H353122 Nonexudative age-related macular degeneration, left eye, intermediate dry stage: Secondary | ICD-10-CM | POA: Diagnosis not present

## 2015-12-30 DIAGNOSIS — H402211 Chronic angle-closure glaucoma, right eye, mild stage: Secondary | ICD-10-CM | POA: Diagnosis not present

## 2015-12-30 DIAGNOSIS — H353112 Nonexudative age-related macular degeneration, right eye, intermediate dry stage: Secondary | ICD-10-CM | POA: Diagnosis not present

## 2016-03-09 ENCOUNTER — Other Ambulatory Visit: Payer: Self-pay

## 2016-04-19 DIAGNOSIS — M1009 Idiopathic gout, multiple sites: Secondary | ICD-10-CM | POA: Diagnosis not present

## 2016-04-19 DIAGNOSIS — M316 Other giant cell arteritis: Secondary | ICD-10-CM | POA: Diagnosis not present

## 2016-04-19 DIAGNOSIS — M150 Primary generalized (osteo)arthritis: Secondary | ICD-10-CM | POA: Diagnosis not present

## 2016-04-19 DIAGNOSIS — R768 Other specified abnormal immunological findings in serum: Secondary | ICD-10-CM | POA: Diagnosis not present

## 2016-04-23 DIAGNOSIS — M549 Dorsalgia, unspecified: Secondary | ICD-10-CM | POA: Diagnosis not present

## 2016-04-23 DIAGNOSIS — Z23 Encounter for immunization: Secondary | ICD-10-CM | POA: Diagnosis not present

## 2016-04-23 DIAGNOSIS — E7800 Pure hypercholesterolemia, unspecified: Secondary | ICD-10-CM | POA: Diagnosis not present

## 2016-04-23 DIAGNOSIS — I10 Essential (primary) hypertension: Secondary | ICD-10-CM | POA: Diagnosis not present

## 2016-05-01 DIAGNOSIS — R159 Full incontinence of feces: Secondary | ICD-10-CM | POA: Diagnosis not present

## 2016-06-01 DIAGNOSIS — R159 Full incontinence of feces: Secondary | ICD-10-CM | POA: Diagnosis not present

## 2016-06-25 DIAGNOSIS — H402211 Chronic angle-closure glaucoma, right eye, mild stage: Secondary | ICD-10-CM | POA: Diagnosis not present

## 2016-06-25 DIAGNOSIS — H402223 Chronic angle-closure glaucoma, left eye, severe stage: Secondary | ICD-10-CM | POA: Diagnosis not present

## 2016-06-25 DIAGNOSIS — M316 Other giant cell arteritis: Secondary | ICD-10-CM | POA: Diagnosis not present

## 2016-06-25 DIAGNOSIS — H169 Unspecified keratitis: Secondary | ICD-10-CM | POA: Diagnosis not present

## 2016-07-13 DIAGNOSIS — I10 Essential (primary) hypertension: Secondary | ICD-10-CM | POA: Diagnosis not present

## 2016-07-13 DIAGNOSIS — I776 Arteritis, unspecified: Secondary | ICD-10-CM | POA: Diagnosis not present

## 2016-07-13 DIAGNOSIS — E7800 Pure hypercholesterolemia, unspecified: Secondary | ICD-10-CM | POA: Diagnosis not present

## 2016-07-13 DIAGNOSIS — M5416 Radiculopathy, lumbar region: Secondary | ICD-10-CM | POA: Diagnosis not present

## 2016-07-19 DIAGNOSIS — M316 Other giant cell arteritis: Secondary | ICD-10-CM | POA: Diagnosis not present

## 2016-07-19 DIAGNOSIS — M1009 Idiopathic gout, multiple sites: Secondary | ICD-10-CM | POA: Diagnosis not present

## 2016-07-19 DIAGNOSIS — M150 Primary generalized (osteo)arthritis: Secondary | ICD-10-CM | POA: Diagnosis not present

## 2016-07-19 DIAGNOSIS — R768 Other specified abnormal immunological findings in serum: Secondary | ICD-10-CM | POA: Diagnosis not present

## 2016-08-12 ENCOUNTER — Emergency Department (HOSPITAL_COMMUNITY): Payer: Medicare Other

## 2016-08-12 ENCOUNTER — Observation Stay (HOSPITAL_COMMUNITY)
Admission: EM | Admit: 2016-08-12 | Discharge: 2016-08-13 | Disposition: A | Discharge status: Home Health | Payer: Medicare Other | Attending: Internal Medicine | Admitting: Internal Medicine

## 2016-08-12 ENCOUNTER — Observation Stay (HOSPITAL_COMMUNITY): Payer: Medicare Other

## 2016-08-12 ENCOUNTER — Observation Stay (HOSPITAL_BASED_OUTPATIENT_CLINIC_OR_DEPARTMENT_OTHER): Payer: Medicare Other

## 2016-08-12 ENCOUNTER — Encounter (HOSPITAL_COMMUNITY): Payer: Self-pay | Admitting: Emergency Medicine

## 2016-08-12 DIAGNOSIS — N182 Chronic kidney disease, stage 2 (mild): Secondary | ICD-10-CM | POA: Diagnosis not present

## 2016-08-12 DIAGNOSIS — I10 Essential (primary) hypertension: Secondary | ICD-10-CM

## 2016-08-12 DIAGNOSIS — R404 Transient alteration of awareness: Secondary | ICD-10-CM | POA: Diagnosis not present

## 2016-08-12 DIAGNOSIS — R531 Weakness: Secondary | ICD-10-CM | POA: Insufficient documentation

## 2016-08-12 DIAGNOSIS — I129 Hypertensive chronic kidney disease with stage 1 through stage 4 chronic kidney disease, or unspecified chronic kidney disease: Secondary | ICD-10-CM | POA: Diagnosis not present

## 2016-08-12 DIAGNOSIS — Z8249 Family history of ischemic heart disease and other diseases of the circulatory system: Secondary | ICD-10-CM | POA: Diagnosis not present

## 2016-08-12 DIAGNOSIS — G9389 Other specified disorders of brain: Secondary | ICD-10-CM | POA: Diagnosis not present

## 2016-08-12 DIAGNOSIS — Z7952 Long term (current) use of systemic steroids: Secondary | ICD-10-CM | POA: Insufficient documentation

## 2016-08-12 DIAGNOSIS — I679 Cerebrovascular disease, unspecified: Secondary | ICD-10-CM | POA: Diagnosis not present

## 2016-08-12 DIAGNOSIS — Z823 Family history of stroke: Secondary | ICD-10-CM | POA: Diagnosis not present

## 2016-08-12 DIAGNOSIS — Z7982 Long term (current) use of aspirin: Secondary | ICD-10-CM | POA: Insufficient documentation

## 2016-08-12 DIAGNOSIS — Z79899 Other long term (current) drug therapy: Secondary | ICD-10-CM | POA: Diagnosis not present

## 2016-08-12 DIAGNOSIS — M316 Other giant cell arteritis: Secondary | ICD-10-CM

## 2016-08-12 DIAGNOSIS — I359 Nonrheumatic aortic valve disorder, unspecified: Secondary | ICD-10-CM

## 2016-08-12 DIAGNOSIS — M109 Gout, unspecified: Secondary | ICD-10-CM | POA: Diagnosis not present

## 2016-08-12 DIAGNOSIS — R251 Tremor, unspecified: Secondary | ICD-10-CM | POA: Diagnosis not present

## 2016-08-12 DIAGNOSIS — I4891 Unspecified atrial fibrillation: Secondary | ICD-10-CM | POA: Insufficient documentation

## 2016-08-12 DIAGNOSIS — I6782 Cerebral ischemia: Secondary | ICD-10-CM | POA: Insufficient documentation

## 2016-08-12 DIAGNOSIS — E7800 Pure hypercholesterolemia, unspecified: Secondary | ICD-10-CM | POA: Diagnosis not present

## 2016-08-12 DIAGNOSIS — E785 Hyperlipidemia, unspecified: Secondary | ICD-10-CM | POA: Diagnosis present

## 2016-08-12 DIAGNOSIS — R42 Dizziness and giddiness: Secondary | ICD-10-CM | POA: Diagnosis not present

## 2016-08-12 HISTORY — DX: Dizziness and giddiness: R42

## 2016-08-12 LAB — CBC WITH DIFFERENTIAL/PLATELET
BASOS ABS: 0 10*3/uL (ref 0.0–0.1)
Basophils Relative: 0 %
EOS ABS: 0.2 10*3/uL (ref 0.0–0.7)
Eosinophils Relative: 4 %
HEMATOCRIT: 39.9 % (ref 36.0–46.0)
HEMOGLOBIN: 12.9 g/dL (ref 12.0–15.0)
LYMPHS ABS: 2.3 10*3/uL (ref 0.7–4.0)
LYMPHS PCT: 46 %
MCH: 28.9 pg (ref 26.0–34.0)
MCHC: 32.3 g/dL (ref 30.0–36.0)
MCV: 89.3 fL (ref 78.0–100.0)
Monocytes Absolute: 0.5 10*3/uL (ref 0.1–1.0)
Monocytes Relative: 9 %
Neutro Abs: 2.1 10*3/uL (ref 1.7–7.7)
Neutrophils Relative %: 41 %
Platelets: UNDETERMINED 10*3/uL (ref 150–400)
RBC: 4.47 MIL/uL (ref 3.87–5.11)
RDW: 14.6 % (ref 11.5–15.5)
WBC: 5.1 10*3/uL (ref 4.0–10.5)

## 2016-08-12 LAB — COMPREHENSIVE METABOLIC PANEL
ALBUMIN: 2.8 g/dL — AB (ref 3.5–5.0)
ALK PHOS: 71 U/L (ref 38–126)
ALT: 15 U/L (ref 14–54)
ANION GAP: 9 (ref 5–15)
AST: 20 U/L (ref 15–41)
BILIRUBIN TOTAL: 0.4 mg/dL (ref 0.3–1.2)
BUN: 20 mg/dL (ref 6–20)
CALCIUM: 9.4 mg/dL (ref 8.9–10.3)
CO2: 22 mmol/L (ref 22–32)
Chloride: 107 mmol/L (ref 101–111)
Creatinine, Ser: 1.06 mg/dL — ABNORMAL HIGH (ref 0.44–1.00)
GFR, EST AFRICAN AMERICAN: 56 mL/min — AB (ref >60)
GFR, EST NON AFRICAN AMERICAN: 49 mL/min — AB (ref >60)
GLUCOSE: 88 mg/dL (ref 65–99)
POTASSIUM: 3.8 mmol/L (ref 3.5–5.1)
Sodium: 138 mmol/L (ref 135–145)
Total Protein: 6.7 g/dL (ref 6.5–8.1)

## 2016-08-12 LAB — URINALYSIS, ROUTINE W REFLEX MICROSCOPIC
BACTERIA UA: NONE SEEN
BILIRUBIN URINE: NEGATIVE
Glucose, UA: NEGATIVE mg/dL
HGB URINE DIPSTICK: NEGATIVE
KETONES UR: NEGATIVE mg/dL
NITRITE: NEGATIVE
PH: 6 (ref 5.0–8.0)
Protein, ur: NEGATIVE mg/dL
Specific Gravity, Urine: 1.014 (ref 1.005–1.030)

## 2016-08-12 LAB — PROTIME-INR
INR: 1.07
PROTHROMBIN TIME: 14 s (ref 11.4–15.2)

## 2016-08-12 LAB — CBC
HCT: 36.9 % (ref 36.0–46.0)
Hemoglobin: 12 g/dL (ref 12.0–15.0)
MCH: 28.6 pg (ref 26.0–34.0)
MCHC: 32.5 g/dL (ref 30.0–36.0)
MCV: 87.9 fL (ref 78.0–100.0)
PLATELETS: 268 10*3/uL (ref 150–400)
RBC: 4.2 MIL/uL (ref 3.87–5.11)
RDW: 14.2 % (ref 11.5–15.5)
WBC: 5 10*3/uL (ref 4.0–10.5)

## 2016-08-12 LAB — ECHOCARDIOGRAM COMPLETE
HEIGHTINCHES: 66 in
WEIGHTICAEL: 2928 [oz_av]

## 2016-08-12 LAB — APTT: aPTT: 29 seconds (ref 24–36)

## 2016-08-12 LAB — TROPONIN I

## 2016-08-12 LAB — PLATELET COUNT: Platelets: 271 10*3/uL (ref 150–400)

## 2016-08-12 MED ORDER — ALLOPURINOL 100 MG PO TABS
100.0000 mg | ORAL_TABLET | Freq: Every day | ORAL | Status: DC
  Administered 2016-08-12 – 2016-08-13 (×2): 100 mg via ORAL
  Filled 2016-08-12 (×3): qty 1

## 2016-08-12 MED ORDER — SODIUM CHLORIDE 0.9 % IV SOLN
INTRAVENOUS | Status: DC
  Administered 2016-08-12: 18:00:00 via INTRAVENOUS
  Administered 2016-08-13: 1000 mL via INTRAVENOUS

## 2016-08-12 MED ORDER — MECLIZINE HCL 12.5 MG PO TABS: 12.5000 mg | ORAL_TABLET | Freq: Two times a day (BID) | ORAL | Status: DC | PRN

## 2016-08-12 MED ORDER — HYPROMELLOSE (GONIOSCOPIC) 2.5 % OP SOLN: 1.0000 [drp] | Freq: Three times a day (TID) | OPHTHALMIC | Status: DC | PRN

## 2016-08-12 MED ORDER — SENNOSIDES-DOCUSATE SODIUM 8.6-50 MG PO TABS
1.0000 | ORAL_TABLET | Freq: Every evening | ORAL | Status: DC | PRN
  Filled 2016-08-12: qty 1

## 2016-08-12 MED ORDER — DEXTROSE 5 % IV SOLN
1.0000 g | INTRAVENOUS | Status: DC
  Administered 2016-08-12: 1 g via INTRAVENOUS
  Filled 2016-08-12 (×2): qty 10

## 2016-08-12 MED ORDER — ACETAMINOPHEN 325 MG PO TABS
650.0000 mg | ORAL_TABLET | ORAL | Status: DC | PRN
  Administered 2016-08-13: 650 mg via ORAL
  Filled 2016-08-12: qty 2

## 2016-08-12 MED ORDER — HEPARIN SODIUM (PORCINE) 5000 UNIT/ML IJ SOLN
5000.0000 [IU] | Freq: Three times a day (TID) | INTRAMUSCULAR | Status: DC
  Administered 2016-08-12 – 2016-08-13 (×2): 5000 [IU] via SUBCUTANEOUS
  Filled 2016-08-12 (×2): qty 1

## 2016-08-12 MED ORDER — POTASSIUM CHLORIDE CRYS ER 10 MEQ PO TBCR: 10.0000 meq | EXTENDED_RELEASE_TABLET | Freq: Every day | ORAL | Status: DC

## 2016-08-12 MED ORDER — ACETAMINOPHEN 650 MG RE SUPP: 650.0000 mg | RECTAL | Status: DC | PRN

## 2016-08-12 MED ORDER — DORZOLAMIDE HCL 2 % OP SOLN
1.0000 [drp] | Freq: Two times a day (BID) | OPHTHALMIC | Status: DC
Start: 2016-08-12 — End: 2016-08-13

## 2016-08-12 MED ORDER — POTASSIUM CHLORIDE CRYS ER 10 MEQ PO TBCR
10.0000 meq | EXTENDED_RELEASE_TABLET | Freq: Every day | ORAL | Status: DC
  Administered 2016-08-13: 10 meq via ORAL
  Filled 2016-08-12: qty 1

## 2016-08-12 MED ORDER — AMLODIPINE BESYLATE 5 MG PO TABS
5.0000 mg | ORAL_TABLET | Freq: Every day | ORAL | Status: DC
  Administered 2016-08-12 – 2016-08-13 (×2): 5 mg via ORAL
  Filled 2016-08-12 (×2): qty 1

## 2016-08-12 MED ORDER — LORAZEPAM 2 MG/ML IJ SOLN: 0.5000 mg | Freq: Once | INTRAMUSCULAR | Status: DC

## 2016-08-12 MED ORDER — ACETAMINOPHEN 160 MG/5ML PO SOLN: 650.0000 mg | ORAL | Status: DC | PRN

## 2016-08-12 MED ORDER — FUROSEMIDE 20 MG PO TABS
10.0000 mg | ORAL_TABLET | Freq: Every day | ORAL | Status: DC
  Administered 2016-08-13: 10 mg via ORAL
  Filled 2016-08-12: qty 1

## 2016-08-12 MED ORDER — ASPIRIN EC 81 MG PO TBEC
81.0000 mg | DELAYED_RELEASE_TABLET | Freq: Every day | ORAL | Status: DC
  Administered 2016-08-12 – 2016-08-13 (×2): 81 mg via ORAL
  Filled 2016-08-12 (×2): qty 1

## 2016-08-12 MED ORDER — ATORVASTATIN CALCIUM 10 MG PO TABS: 10.0000 mg | ORAL_TABLET | Freq: Every day | ORAL | Status: DC

## 2016-08-12 NOTE — Consult Note (Addendum)
Neurology Consult Note  Reason for Consultation: Vertigo, ? stroke  Requesting provider: Sharene Butters, PA  CC: Dizziness  HPI: This is a 82 year old right-handed woman who presented to the emergency department for evaluation of an episode of dizziness. History is obtained directly from the patient who is a fair historian.  The patient reports this morning, she was laying in bed reading a book. She decided to put her book away and get up when she suddenly became dizzy. On further questioning, she states that the room was spinning. She says she laid back down the cushion and she would not be of benefit to walk if she got up out of bed. She denies any associated nausea or vomiting. She denies any hearing changes. She denies any associated headache. No other focal deficits were reported, including vision loss, double vision, difficulty speaking, difficulty swallowing, weakness, numbness. She states that the spinning sensation lasts approximately 6-8 minutes after which it gradually resolved. Following the spinning sensation, she states that she "felt strange in my head." On further questioning, it sounds as if she was somewhat drowsy. She was able to call EMS and on their arrival she felt like she is back to normal. She has not had any recurrence.  She reports a previous similar episode 2 weeks ago. She was in church at the time and says that she was standing up on this episode occurred. Characteristics were similar to today's episode.  In the emergency department, EMS reported that while they were with the patient she had what appeared to be atrial fibrillation. She was also reported having an episode of bradycardia with heart rate as low as 30. She did not have any neurologic symptoms. She has not had any neurologic symptoms in the emergency department and has been eating and walking without difficulty. I was called by the ED attending reports consult was placed by the admitting hospitalist service to  rule out stroke.  PMH:  Past Medical History:  Diagnosis Date  . High cholesterol   . Hypertension   . Pneumonia     PSH:  Past Surgical History:  Procedure Laterality Date  . BLADDER SURGERY    . catract Left 04/06/2014  . VAGINAL HYSTERECTOMY      Family history: Family History  Problem Relation Age of Onset  . Heart attack Mother   . Diabetes Mother   . Stroke Father   . High blood pressure Father     Social history:  Social History   Social History  . Marital status: Widowed    Spouse name: N/A  . Number of children: 4  . Years of education: 12   Occupational History  .  Retired    Retired   Social History Main Topics  . Smoking status: Never Smoker  . Smokeless tobacco: Never Used  . Alcohol use No  . Drug use: No  . Sexual activity: Not on file   Other Topics Concern  . Not on file   Social History Narrative   Patient is widowed and her grandson stay with her.    Retired.   Education high school.   Right handed.   Caffeine coffee one cup daily.    Current outpatient meds: Current Meds  Medication Sig  . acetaminophen (TYLENOL) 325 MG tablet Take 650 mg by mouth every 6 (six) hours as needed for mild pain.  Marland Kitchen allopurinol (ZYLOPRIM) 100 MG tablet Take 1 tablet by mouth daily.  Marland Kitchen amLODipine (NORVASC) 5 MG tablet  Take 5 mg by mouth daily.  Marland Kitchen aspirin 81 MG tablet Take 81 mg by mouth daily.  . dorzolamide (TRUSOPT) 2 % ophthalmic solution Place 1 drop into the left eye 2 (two) times daily.   . furosemide (LASIX) 20 MG tablet Take 10 mg by mouth daily.   . hydroxypropyl methylcellulose / hypromellose (ISOPTO TEARS / GONIOVISC) 2.5 % ophthalmic solution Place 1 drop into both eyes 3 (three) times daily as needed for dry eyes.  . potassium chloride (K-DUR) 10 MEQ tablet Take 10 mEq by mouth daily.    Current inpatient meds:  Current Facility-Administered Medications  Medication Dose Route Frequency Provider Last Rate Last Dose  . 0.9 %  sodium  chloride infusion   Intravenous Continuous Rondel Jumbo, PA-C      . acetaminophen (TYLENOL) tablet 650 mg  650 mg Oral Q4H PRN Rondel Jumbo, PA-C       Or  . acetaminophen (TYLENOL) solution 650 mg  650 mg Per Tube Q4H PRN Rondel Jumbo, PA-C       Or  . acetaminophen (TYLENOL) suppository 650 mg  650 mg Rectal Q4H PRN Rondel Jumbo, PA-C      . allopurinol (ZYLOPRIM) tablet 100 mg  100 mg Oral Daily Rondel Jumbo, PA-C      . amLODipine (NORVASC) tablet 5 mg  5 mg Oral Daily Rondel Jumbo, PA-C      . aspirin EC tablet 81 mg  81 mg Oral Daily Rondel Jumbo, PA-C      . cefTRIAXone (ROCEPHIN) 1 g in dextrose 5 % 50 mL IVPB  1 g Intravenous Q24H Thuy D Dang, RPH      . dorzolamide (TRUSOPT) 2 % ophthalmic solution 1 drop  1 drop Left Eye BID Rondel Jumbo, PA-C      . Derrill Memo ON 08/13/2016] furosemide (LASIX) tablet 10 mg  10 mg Oral Daily Rondel Jumbo, PA-C      . heparin injection 5,000 Units  5,000 Units Subcutaneous Q8H Coralee Pesa Wertman, PA-C      . hydroxypropyl methylcellulose / hypromellose (ISOPTO TEARS / GONIOVISC) 2.5 % ophthalmic solution 1 drop  1 drop Both Eyes TID PRN Rondel Jumbo, PA-C      . LORazepam (ATIVAN) injection 0.5 mg  0.5 mg Intravenous Once Rondel Jumbo, PA-C      . meclizine (ANTIVERT) tablet 12.5 mg  12.5 mg Oral BID PRN Rondel Jumbo, PA-C      . Derrill Memo ON 08/13/2016] potassium chloride (K-DUR,KLOR-CON) CR tablet 10 mEq  10 mEq Oral Daily Rondel Jumbo, PA-C      . senna-docusate (Senokot-S) tablet 1 tablet  1 tablet Oral QHS PRN Rondel Jumbo, PA-C       Current Outpatient Prescriptions  Medication Sig Dispense Refill  . acetaminophen (TYLENOL) 325 MG tablet Take 650 mg by mouth every 6 (six) hours as needed for mild pain.    Marland Kitchen allopurinol (ZYLOPRIM) 100 MG tablet Take 1 tablet by mouth daily.  0  . amLODipine (NORVASC) 5 MG tablet Take 5 mg by mouth daily.    Marland Kitchen aspirin 81 MG tablet Take 81 mg by mouth daily.    . dorzolamide (TRUSOPT) 2 %  ophthalmic solution Place 1 drop into the left eye 2 (two) times daily.     . furosemide (LASIX) 20 MG tablet Take 10 mg by mouth daily.     . hydroxypropyl methylcellulose / hypromellose (ISOPTO TEARS /  GONIOVISC) 2.5 % ophthalmic solution Place 1 drop into both eyes 3 (three) times daily as needed for dry eyes.    . potassium chloride (K-DUR) 10 MEQ tablet Take 10 mEq by mouth daily.    Marland Kitchen atorvastatin (LIPITOR) 10 MG tablet Take 10 mg by mouth daily.    . Calcium Carbonate-Vitamin D (CALCIUM 600 + D PO) Take 1 tablet by mouth daily.    . Omega-3 Fatty Acids (FISH OIL) 1000 MG CAPS Take 1,000 mg by mouth daily.     . predniSONE (DELTASONE) 5 MG tablet Take 5 mg by mouth daily with breakfast.      Allergies: No Known Allergies  ROS: As per HPI. A full 14-point review of systems was performed and is otherwise unremarkable.   PE:  BP 123/82   Pulse 95   Temp 98 F (36.7 C) (Oral)   Resp 24   Ht 5\' 6"  (1.676 m)   Wt 83 kg (183 lb)   SpO2 95%   BMI 29.54 kg/m   General: WDWN, no acute distress. AAO x4. Speech clear, no dysarthria. No aphasia. Follows commands briskly. Affect is bright with congruent mood. Comportment is normal.  HEENT: Normocephalic. Neck supple without LAD. MMM, OP clear. Dentition good. Sclerae anicteric. No conjunctival injection.  CV: Regular, no murmur. Carotid pulses full and symmetric, no bruits. Distal pulses 2+ and symmetric.  Lungs: CTAB.  Abdomen: Soft, non-distended, non-tender. Bowel sounds present x4.  Extremities: No C/C/E. Neuro:  CN: Pupils are equal and round. They are symmetrically reactive from 3-->2 mm. EOMI without nystagmus. There is some breakup of smooth pursuit in all directions of gaze. No reported diplopia. Facial sensation is intact to light touch. Face is symmetric at rest with normal strength and mobility. Hearing is intact to conversational voice. Palate elevates symmetrically and uvula is midline. Voice is normal in tone, pitch and  quality. Bilateral SCM and trapezii are 5/5. Tongue is midline with normal bulk and mobility.  Motor: Normal bulk, tone, and strength. No tremor or other abnormal movements. No drift.  Sensation: Intact to light touch.  DTRs: 2+, symmetric. Toes downgoing bilaterally. No pathologic reflexes.  Coordination: Finger-to-nose and heel-to-shin are without dysmetria. Finger taps are normal in amplitude and speed, no decrement.    Labs:  Lab Results  Component Value Date   WBC 5.1 08/12/2016   HGB 12.9 08/12/2016   HCT 39.9 08/12/2016   PLT PLATELET CLUMPS NOTED ON SMEAR, UNABLE TO ESTIMATE 08/12/2016   GLUCOSE 88 08/12/2016   ALT 15 08/12/2016   AST 20 08/12/2016   NA 138 08/12/2016   K 3.8 08/12/2016   CL 107 08/12/2016   CREATININE 1.06 (H) 08/12/2016   BUN 20 08/12/2016   CO2 22 08/12/2016   Urinalysis is notable for trace leukocyte esterase, negative nitrites  Imaging:  I have personally and independently reviewed CT scan of the head without contrast from today. This shows mild diffuse generalized atrophy. Mild to moderate burden of chronic small vessel ischemic changes present in the bihemispheric white matter. Atherosclerotic changes are noted in the vertebrobasilar circulation and carotids. No acute abnormalities appreciated.  Assessment and Plan:  1. Vertigo: The patient describes isolated vertigo. No other symptoms were reported to suggest brainstem ischemia. She is not sure if she changed head position or to onset of her vertigo but story is suggestive of BPPV. This fully resolved within a few minutes. She has had previous similar episodes. History as reported with normal physical examination would  make ischemic stroke an unlikely etiology for her presentation. MRI scan of the brain has been ordered by the admitting team. If this does not show any evidence of acute ischemia, then I would forego any further stroke workup. She had reported atrial fibrillation and bradycardia with EMS,  raising concern for possible cardiac arrhythmia which could be contributing to her symptoms. Will defer further workup to the admitting hospitalist team. She can be observed at this point. If her vertigo recurs, consider symptomatic management though this is likely to be of little benefit given how brief her episodes have been.  2. Cerebrovascular disease: CT scan of the head shows significant chronic small vessel ischemic change. Her known risk factors for cerebrovascular disease include age, hypercholesterolemia, and hypertension. She is taking aspirin 81 mg daily and atorvastatin 10 mg daily as an outpatient and these should be continued. Short tight control of blood pressure and blood glucose.   This was discussed with the patient and she is in agreement with the plan as noted. She was given the opportunity to ask any questions and these were addressed to her satisfaction.   Addendum MRI brain and MRA head completed. I have personally and independently reviewed these. There is no acute ischemic. She has moderate chronic small vessel ischemic changes in the bihemispheric white matter. Mild generalized atrophy is noted. There is no significant pathology in the vertebrobasilar system.   MRI does not support stroke. Presentation is not typical of cerebrovascular disease. Recommendations are as noted above, nothing further at this time. Neurology will sign off. Call if any new issues arise.

## 2016-08-12 NOTE — Progress Notes (Signed)
Patient admitted to [facility], alert, oriented x4, skin intact, placed on tele #4, confirmed w CCMD, NIHHS 0, denies any dizziness or vertigo at this time. States she will notify staff if symptoms happen. Oriented to room, unit, staff. Bed alarm on. Will continue to monitor.

## 2016-08-12 NOTE — ED Notes (Signed)
Pt returns from radiology placed back on monitor. 

## 2016-08-12 NOTE — ED Notes (Signed)
Returned from ct scan 

## 2016-08-12 NOTE — ED Notes (Signed)
Patient transported to CT 

## 2016-08-12 NOTE — ED Triage Notes (Signed)
Pt here with c/o dizziness, pt states about 0730 this am the room started spinning , no chest pain nausea or sob , pt was found to be in afib which is new

## 2016-08-12 NOTE — H&P (Signed)
History and Physical    Lori Wilson [id number] DOB: 1934-10-21 DOA: 08/12/2016   PCP: Foye Spurling, MD   Patient coming from:  Home    Chief Complaint: Dizziness   HPI: Lori Wilson is a 82 y.o. female with a history of HTN, HLD, gout, brought to the ED after experiencing an episode of dizziness versus vertigo while in bed around 7:30 am. She had a similar episode  2 weeks ago while in church, but she failed to be seen as outpatient. She described today's symptoms as "room spinning around ". Symptoms have not completely resolved. EMS stated the patient was in AFib but this is unclear as patient has been on SR since admission and denies any prior history of irregular heart beat. Patient denies any chest pain or palpitations. She denies any headaches, or new vision changes. No dysarthria or this idea. Denies any unilateral weakness or sensory deficiencies. She denies any falls or trauma to the head. No confusion or seizures were reported. Denies any  shortness of breath. Denies any fever or chills, or night sweats. Denies any abdominal pain, or diarrhea. Denies any sick contacts or new foods. Denies any recent long distance trip, rushes or tick bites.  No recent surgeries. Denies lower extremity swelling. Denies  neuropathy. Compliant with her medications.+ fam Hx stroke in father and CHF in mother   ED Course:  BP 124/80   Pulse (!) 56   Temp 98 F (36.7 C) (Oral)   Resp 13   Ht 5\' 6"  (1.676 m)   Wt 83 kg (183 lb)   SpO2 100%   BMI 29.54 kg/m    sodium 138 potassium 3.8 creatinine 1.06 albumin 2.8  liver function tests are normal  white count 5.1 hemoglobin 12.9 platelets unable to obtain, were in clumps will repeat Trace leukocytes in urine with hazy appearance, no blood. CT of the head with doubt contrast was negative for acute findings  chest x-ray is negative for acute findings   Review of Systems: As per HPI otherwise 10 point review of systems negative.   Past  Medical History:  Diagnosis Date  . High cholesterol   . Hypertension   . Pneumonia     Past Surgical History:  Procedure Laterality Date  . BLADDER SURGERY    . catract Left 04/06/2014  . VAGINAL HYSTERECTOMY      Social History Social History   Social History  . Marital status: Widowed    Spouse name: N/A  . Number of children: 4  . Years of education: 12   Occupational History  .  Retired    Retired   Social History Main Topics  . Smoking status: Never Smoker  . Smokeless tobacco: Never Used  . Alcohol use No  . Drug use: No  . Sexual activity: Not on file   Other Topics Concern  . Not on file   Social History Narrative   Patient is widowed and her grandson stay with her.    Retired.   Education high school.   Right handed.   Caffeine coffee one cup daily.     No Known Allergies  Family History  Problem Relation Age of Onset  . Heart attack Mother   . Diabetes Mother   . Stroke Father   . High blood pressure Father       Prior to Admission medications   Medication Sig Start Date End Date Taking? Authorizing Provider  acetaminophen (TYLENOL) 325 MG tablet Take 650  mg by mouth every 6 (six) hours as needed for mild pain.    Historical Provider, MD  allopurinol (ZYLOPRIM) 100 MG tablet Take 1 tablet by mouth daily. 08/23/14   Historical Provider, MD  amLODipine (NORVASC) 5 MG tablet Take 5 mg by mouth daily.    Historical Provider, MD  aspirin 81 MG tablet Take 81 mg by mouth daily.    Historical Provider, MD  atorvastatin (LIPITOR) 10 MG tablet Take 10 mg by mouth daily.    Historical Provider, MD  Calcium Carbonate-Vitamin D (CALCIUM 600 + D PO) Take 1 tablet by mouth daily.    Historical Provider, MD  dorzolamide (TRUSOPT) 2 % ophthalmic solution Place 1 drop into both eyes 2 (two) times daily.    Historical Provider, MD  furosemide (LASIX) 20 MG tablet Take 20 mg by mouth daily.     Historical Provider, MD  hydroxypropyl methylcellulose /  hypromellose (ISOPTO TEARS / GONIOVISC) 2.5 % ophthalmic solution Place 1 drop into both eyes 3 (three) times daily as needed for dry eyes.    Historical Provider, MD  Omega-3 Fatty Acids (FISH OIL) 1000 MG CAPS Take 1,000 mg by mouth daily.     Historical Provider, MD  predniSONE (DELTASONE) 5 MG tablet Take 5 mg by mouth daily with breakfast.    Historical Provider, MD    Physical Exam:  Vitals:   08/12/16 0832 08/12/16 0845 08/12/16 1200 08/12/16 1215  BP: 131/79 143/78 117/76 124/80  Pulse: 62 62 79 (!) 56  Resp: 20 14 15 13   Temp: 98 F (36.7 C)     TempSrc: Oral     SpO2: 98% 98% 100% 100%  Weight:      Height:       Constitutional: NAD, calm, comfortable  Eyes: PERRL, lids and conjunctivae normal. Right sclera opaque  ENMT: Mucous membranes are moist, without exudate or lesions  Neck: normal, supple, no masses, no thyromegaly Respiratory: clear to auscultation bilaterally, no wheezing, no crackles. Normal respiratory effort  Cardiovascular: Regular rate and rhythm, no murmurs / rubs / gallops. No extremity edema. 2+ pedal pulses. No carotid bruits.  Abdomen: Soft, non tender, No hepatosplenomegaly. Bowel sounds positive.  Musculoskeletal: no clubbing / cyanosis. Moves all extremities Skin: no jaundice, No lesions.  Neurologic: Sensation intact  Strength normal  Psychiatric:   Alert and oriented x 3. Normal mood.     Labs on Admission: I have personally reviewed following labs and imaging studies  CBC:  Recent Labs Lab 08/12/16 0948  WBC 5.1  NEUTROABS 2.1  HGB 12.9  HCT 39.9  MCV 89.3  PLT PLATELET CLUMPS NOTED ON SMEAR, UNABLE TO ESTIMATE    Basic Metabolic Panel:  Recent Labs Lab 08/12/16 1042  NA 138  K 3.8  CL 107  CO2 22  GLUCOSE 88  BUN 20  CREATININE 1.06*  CALCIUM 9.4    GFR: Estimated Creatinine Clearance: 46.7 mL/min (by C-G formula based on SCr of 1.06 mg/dL (H)).  Liver Function Tests:  Recent Labs Lab 08/12/16 1042  AST 20    ALT 15  ALKPHOS 71  BILITOT 0.4  PROT 6.7  ALBUMIN 2.8*   No results for input(s): LIPASE, AMYLASE in the last 168 hours. No results for input(s): AMMONIA in the last 168 hours.  Coagulation Profile: No results for input(s): INR, PROTIME in the last 168 hours.  Cardiac Enzymes:  Recent Labs Lab 08/12/16 1042  TROPONINI <0.03    BNP (last 3 results) No results for  input(s): PROBNP in the last 8760 hours.  HbA1C: No results for input(s): HGBA1C in the last 72 hours.  CBG: No results for input(s): GLUCAP in the last 168 hours.  Lipid Profile: No results for input(s): CHOL, HDL, LDLCALC, TRIG, CHOLHDL, LDLDIRECT in the last 72 hours.  Thyroid Function Tests: No results for input(s): TSH, T4TOTAL, FREET4, T3FREE, THYROIDAB in the last 72 hours.  Anemia Panel: No results for input(s): VITAMINB12, FOLATE, FERRITIN, TIBC, IRON, RETICCTPCT in the last 72 hours.  Urine analysis:    Component Value Date/Time   COLORURINE YELLOW 08/12/2016 0916   APPEARANCEUR HAZY (A) 08/12/2016 0916   LABSPEC 1.014 08/12/2016 0916   PHURINE 6.0 08/12/2016 0916   GLUCOSEU NEGATIVE 08/12/2016 0916   HGBUR NEGATIVE 08/12/2016 0916   BILIRUBINUR NEGATIVE 08/12/2016 0916   KETONESUR NEGATIVE 08/12/2016 0916   PROTEINUR NEGATIVE 08/12/2016 0916   UROBILINOGEN 1.0 04/26/2015 1028   NITRITE NEGATIVE 08/12/2016 0916   LEUKOCYTESUR TRACE (A) 08/12/2016 0916    Sepsis Labs: @LABRCNTIP (procalcitonin:4,lacticidven:4) )No results found for this or any previous visit (from the past 240 hour(s)).   Radiological Exams on Admission: Ct Head Wo Contrast  Result Date: 08/12/2016 CLINICAL DATA:  Dizziness EXAM: CT HEAD WITHOUT CONTRAST TECHNIQUE: Contiguous axial images were obtained from the base of the skull through the vertex without intravenous contrast. COMPARISON:  06/09/2012 FINDINGS: Brain: There is atrophy and chronic small vessel disease changes. No acute intracranial abnormality.  Specifically, no hemorrhage, hydrocephalus, mass lesion, acute infarction, or significant intracranial injury. Vascular: No hyperdense vessel or unexpected calcification. Skull: No acute calvarial abnormality. Sinuses/Orbits: Visualized paranasal sinuses and mastoids clear. Orbital soft tissues unremarkable. Other: None IMPRESSION: No acute intracranial abnormality. Atrophy, chronic microvascular disease. Electronically Signed   By: Rolm Baptise M.D.   On: 08/12/2016 10:54   Dg Chest Portable 1 View  Result Date: 08/12/2016 CLINICAL DATA:  Vertigo EXAM: PORTABLE CHEST 1 VIEW COMPARISON:  12/25/2011 FINDINGS: Heart is upper limits normal in size. No confluent airspace opacities or effusions. No acute bony abnormality. IMPRESSION: No active disease. Electronically Signed   By: Rolm Baptise M.D.   On: 08/12/2016 09:01    EKG: Independently reviewed.  Assessment/Plan Active Problems:   Hypertension   High cholesterol   Tremor   Dizziness   Vertigo   Dizziness versus vertigo : Etiology likely multifactorial including  CVA /TIA versus cardiac versus infection  Patient continues to feel somewhat dizzy. CT head negative. CXR NAD. WBC normal. Tn neg. EKG SR . UA with a trace of leukocytes. Was not orthostatic. Risk factors for CVA include age, HTN, HLD and family history of stroke in father. Denies history of CHF   Telemetry, observation Meclizine bid prn dizziness   IVF  Neurology to consult today     2D echo    EKG in am  MRI/MRA brain   Allow permissive HTN SLP  lipid panel A1C Aspirin  Urine culture, will innitate Rocephin and dc if Cx negative   Chronic kidney disease stage 2 baseline creatinine 1.2    Current Cr 1.06  Lab Results  Component Value Date   CREATININE 1.06 (H) 08/12/2016   CREATININE 1.03 (H) 04/26/2015   CREATININE 1.25 (H) 08/11/2010   IVF Repeat CMET in am    Hypertension BP 124/80   Pulse 56  Controlled Continue home anti-hypertensive medications once  permissive HTN lifted   Gout Continue Allopurinol   DVT prophylaxis:   Heparin   Code Status:   Full  Family Communication:  Discussed with patient Disposition Plan: Expect patient to be discharged to home after condition improves Consults called:   Neuro Admission status:Tele  Obs   Rondel Jumbo, PA-C [facility]   08/12/2016, 2:04 PM

## 2016-08-12 NOTE — CV Procedure (Signed)
2D echo attempted but patient went to MRI 320p

## 2016-08-12 NOTE — ED Provider Notes (Signed)
[place] DEPT Provider Note   CSN: [id number] Arrival date & time: 08/12/16  [id number]     History   Chief Complaint Chief Complaint  Patient presents with  . Dizziness  . Atrial Fibrillation    HPI Lori Wilson is a 82 y.o. female.  HPI  Patientpresents with dizziness. States she been out for a little bit of time this morning when she began to feel the room spinning around her. No headache. States she felt a little unsteady. No chest pain. No trouble breathing. States she's doing well the last few days. EMS stated the patient was in A. Fib but reviewing the EKGs I do not see a clear atrial fibrillation. Also reportedly a heart rate went down to the 30s. Patient states she has had vertigo in the past. No chest pain. No headache. No trouble breathing. States she feels somewhat better but still feels as if things are moving a little bit.   Past Medical History:  Diagnosis Date  . High cholesterol   . Hypertension   . Pneumonia     Patient Active Problem List   Diagnosis Date Noted  . Tremor 02/24/2014  . Hypertension   . High cholesterol   . Pneumonia     Past Surgical History:  Procedure Laterality Date  . BLADDER SURGERY    . catract Left 04/06/2014  . VAGINAL HYSTERECTOMY      OB History    No data available       Home Medications    Prior to Admission medications   Medication Sig Start Date End Date Taking? Authorizing Provider  acetaminophen (TYLENOL) 325 MG tablet Take 650 mg by mouth every 6 (six) hours as needed for mild pain.    Historical Provider, MD  allopurinol (ZYLOPRIM) 100 MG tablet Take 1 tablet by mouth daily. 08/23/14   Historical Provider, MD  amLODipine (NORVASC) 5 MG tablet Take 5 mg by mouth daily.    Historical Provider, MD  aspirin 81 MG tablet Take 81 mg by mouth daily.    Historical Provider, MD  atorvastatin (LIPITOR) 10 MG tablet Take 10 mg by mouth daily.    Historical Provider, MD  Calcium Carbonate-Vitamin D (CALCIUM 600 + D PO)  Take 1 tablet by mouth daily.    Historical Provider, MD  dorzolamide (TRUSOPT) 2 % ophthalmic solution Place 1 drop into both eyes 2 (two) times daily.    Historical Provider, MD  furosemide (LASIX) 20 MG tablet Take 20 mg by mouth daily.     Historical Provider, MD  hydroxypropyl methylcellulose / hypromellose (ISOPTO TEARS / GONIOVISC) 2.5 % ophthalmic solution Place 1 drop into both eyes 3 (three) times daily as needed for dry eyes.    Historical Provider, MD  Omega-3 Fatty Acids (FISH OIL) 1000 MG CAPS Take 1,000 mg by mouth daily.     Historical Provider, MD  predniSONE (DELTASONE) 5 MG tablet Take 5 mg by mouth daily with breakfast.    Historical Provider, MD    Family History Family History  Problem Relation Age of Onset  . Heart attack Mother   . Diabetes Mother   . Stroke Father   . High blood pressure Father     Social History Social History  Substance Use Topics  . Smoking status: Never Smoker  . Smokeless tobacco: Never Used  . Alcohol use No     Allergies   Patient has no known allergies.   Review of Systems Review of Systems  Constitutional: Negative  for appetite change and fatigue.  HENT: Negative for congestion.   Eyes: Negative for photophobia.  Respiratory: Negative for shortness of breath.   Cardiovascular: Negative for chest pain.  Gastrointestinal: Negative for abdominal pain.  Endocrine: Negative for polyuria.  Genitourinary: Negative for dyspareunia.  Musculoskeletal: Negative for back pain.  Neurological: Positive for dizziness. Negative for syncope.  Hematological: Negative for adenopathy.  Psychiatric/Behavioral: Negative for behavioral problems.     Physical Exam Updated Vital Signs BP 124/80   Pulse (!) 56   Temp 98 F (36.7 C) (Oral)   Resp 13   Ht 5\' 6"  (1.676 m)   Wt 183 lb (83 kg)   SpO2 100%   BMI 29.54 kg/m   Physical Exam  Constitutional: She appears well-developed.  HENT:  Head: Atraumatic.  Bilateral TMs without  erythema  Eyes: EOM are normal.  Neck: Neck supple.  Cardiovascular: Normal rate.   Pulmonary/Chest: Effort normal.  Abdominal: Soft.  Musculoskeletal: She exhibits no edema.  Neurological: She is alert.  Finger-nose intact bilaterally. Extraocular was intact without nystagmus. Good grip strength bilaterally.  Skin: Skin is warm. Capillary refill takes less than 2 seconds.  Psychiatric: She has a normal mood and affect.     ED Treatments / Results  Labs (all labs ordered are listed, but only abnormal results are displayed) Labs Reviewed  URINALYSIS, ROUTINE W REFLEX MICROSCOPIC - Abnormal; Notable for the following:       Result Value   APPearance HAZY (*)    Leukocytes, UA TRACE (*)    Squamous Epithelial / LPF 6-30 (*)    All other components within normal limits  COMPREHENSIVE METABOLIC PANEL - Abnormal; Notable for the following:    Creatinine, Ser 1.06 (*)    Albumin 2.8 (*)    GFR calc non Af Amer 49 (*)    GFR calc Af Amer 56 (*)    All other components within normal limits  CBC WITH DIFFERENTIAL/PLATELET  TROPONIN I  COMPREHENSIVE METABOLIC PANEL  TROPONIN I    EKG  EKG Interpretation  Date/Time:  Sunday August 12 2016 08:27:20 EST Ventricular Rate:  62 PR Interval:    QRS Duration: 91 QT Interval:  436 QTC Calculation: 443 R Axis:   -9 Text Interpretation:  Sinus rhythm Borderline prolonged PR interval Low voltage, precordial leads Confirmed by Alvino Chapel  MD, Ovid Curd [phone]) on 08/12/2016 8:42:01 AM Also confirmed by Alvino Chapel  MD, Colden Samaras [phone]), editor Stout CT, Leda Gauze [phone])  on 08/12/2016 11:03:56 AM       Radiology Ct Head Wo Contrast  Result Date: 08/12/2016 CLINICAL DATA:  Dizziness EXAM: CT HEAD WITHOUT CONTRAST TECHNIQUE: Contiguous axial images were obtained from the base of the skull through the vertex without intravenous contrast. COMPARISON:  06/09/2012 FINDINGS: Brain: There is atrophy and chronic small vessel disease changes. No acute  intracranial abnormality. Specifically, no hemorrhage, hydrocephalus, mass lesion, acute infarction, or significant intracranial injury. Vascular: No hyperdense vessel or unexpected calcification. Skull: No acute calvarial abnormality. Sinuses/Orbits: Visualized paranasal sinuses and mastoids clear. Orbital soft tissues unremarkable. Other: None IMPRESSION: No acute intracranial abnormality. Atrophy, chronic microvascular disease. Electronically Signed   By: Rolm Baptise M.D.   On: 08/12/2016 10:54   Dg Chest Portable 1 View  Result Date: 08/12/2016 CLINICAL DATA:  Vertigo EXAM: PORTABLE CHEST 1 VIEW COMPARISON:  12/25/2011 FINDINGS: Heart is upper limits normal in size. No confluent airspace opacities or effusions. No acute bony abnormality. IMPRESSION: No active disease. Electronically Signed   By: Lennette Bihari  [place]over M.D.   On: 08/12/2016 09:01    Procedures Procedures (including critical care time)  Medications Ordered in ED Medications - No data to display   Initial Impression / Assessment and Plan / ED Course  I have reviewed the triage vital signs and the nursing notes.  Pertinent labs & imaging results that were available during my care of the patient were reviewed by me and considered in my medical decision making (see chart for details).      Patient presented after vertigo. Started this morning. Somewhat improved now. Reportedly was in atrial fibrillation for EMS but the strips appear to be mostly sinus. No history of A. Fib. Also reportedly had a heart rate down to 30s. Labs reassuring and is in sinus rhythm here. Head CT reassuring. I think she would benefit from hospital admission to rule out central causes of vertigo and also cardiac monitoring to assess for A. Fib and possible bradycardia.  Final Clinical Impressions(s) / ED Diagnoses   Final diagnoses:  Vertigo    New Prescriptions New Prescriptions   No medications on file     Davonna Belling, MD 08/12/16 1237

## 2016-08-12 NOTE — ED Notes (Signed)
Portable chest xray at bedside.

## 2016-08-12 NOTE — ED Notes (Signed)
Patient transported to MRI 

## 2016-08-12 NOTE — ED Notes (Signed)
Report called , MRI done , and echo done

## 2016-08-12 NOTE — ED Notes (Signed)
Pt able to stand and walk , no dizziness

## 2016-08-12 NOTE — ED Notes (Signed)
ED Provider at bedside. 

## 2016-08-13 ENCOUNTER — Encounter (HOSPITAL_COMMUNITY): Payer: Self-pay | Admitting: General Practice

## 2016-08-13 ENCOUNTER — Observation Stay (HOSPITAL_BASED_OUTPATIENT_CLINIC_OR_DEPARTMENT_OTHER): Payer: Medicare Other

## 2016-08-13 DIAGNOSIS — R42 Dizziness and giddiness: Secondary | ICD-10-CM | POA: Diagnosis not present

## 2016-08-13 DIAGNOSIS — R251 Tremor, unspecified: Secondary | ICD-10-CM | POA: Diagnosis not present

## 2016-08-13 DIAGNOSIS — M316 Other giant cell arteritis: Secondary | ICD-10-CM

## 2016-08-13 DIAGNOSIS — I10 Essential (primary) hypertension: Secondary | ICD-10-CM | POA: Diagnosis not present

## 2016-08-13 DIAGNOSIS — E7800 Pure hypercholesterolemia, unspecified: Secondary | ICD-10-CM | POA: Diagnosis not present

## 2016-08-13 HISTORY — DX: Other giant cell arteritis: M31.6

## 2016-08-13 LAB — COMPREHENSIVE METABOLIC PANEL
ALBUMIN: 2.5 g/dL — AB (ref 3.5–5.0)
ALK PHOS: 63 U/L (ref 38–126)
ALT: 13 U/L — AB (ref 14–54)
AST: 16 U/L (ref 15–41)
Anion gap: 7 (ref 5–15)
BUN: 14 mg/dL (ref 6–20)
CALCIUM: 8.9 mg/dL (ref 8.9–10.3)
CHLORIDE: 108 mmol/L (ref 101–111)
CO2: 23 mmol/L (ref 22–32)
CREATININE: 0.87 mg/dL (ref 0.44–1.00)
GFR calc non Af Amer: >60 mL/min (ref >60)
GLUCOSE: 94 mg/dL (ref 65–99)
Potassium: 3.5 mmol/L (ref 3.5–5.1)
SODIUM: 138 mmol/L (ref 135–145)
Total Bilirubin: 0.3 mg/dL (ref 0.3–1.2)
Total Protein: 5.8 g/dL — ABNORMAL LOW (ref 6.5–8.1)

## 2016-08-13 LAB — VAS US CAROTID
LEFT ECA DIAS: -10 cm/s
LEFT VERTEBRAL DIAS: 10 cm/s
Left CCA dist dias: 15 cm/s
Left CCA dist sys: 59 cm/s
Left CCA prox dias: 18 cm/s
Left CCA prox sys: 77 cm/s
Left ICA dist dias: -27 cm/s
Left ICA dist sys: -73 cm/s
Left ICA prox dias: -19 cm/s
Left ICA prox sys: -59 cm/s
RIGHT ECA DIAS: -4 cm/s
RIGHT VERTEBRAL DIAS: 10 cm/s
Right CCA prox dias: 14 cm/s
Right CCA prox sys: 73 cm/s
Right cca dist sys: -47 cm/s

## 2016-08-13 LAB — CBC
HCT: 32.7 % — ABNORMAL LOW (ref 36.0–46.0)
Hemoglobin: 10.6 g/dL — ABNORMAL LOW (ref 12.0–15.0)
MCH: 28.6 pg (ref 26.0–34.0)
MCHC: 32.4 g/dL (ref 30.0–36.0)
MCV: 88.4 fL (ref 78.0–100.0)
Platelets: 241 K/uL (ref 150–400)
RBC: 3.7 MIL/uL — ABNORMAL LOW (ref 3.87–5.11)
RDW: 14.4 % (ref 11.5–15.5)
WBC: 5.6 K/uL (ref 4.0–10.5)

## 2016-08-13 LAB — TROPONIN I: Troponin I: <0.03 ng/mL

## 2016-08-13 LAB — URINE CULTURE: Culture: <10000 — AB

## 2016-08-13 LAB — LIPID PANEL
Cholesterol: 189 mg/dL (ref 0–200)
HDL: 45 mg/dL
LDL Cholesterol: 122 mg/dL — ABNORMAL HIGH (ref 0–99)
Total CHOL/HDL Ratio: 4.2 ratio
Triglycerides: 111 mg/dL
VLDL: 22 mg/dL (ref 0–40)

## 2016-08-13 MED ORDER — MECLIZINE HCL 12.5 MG PO TABS: 12.5000 mg | ORAL_TABLET | Freq: Two times a day (BID) | ORAL | 0 refills | Status: DC | PRN

## 2016-08-13 NOTE — Care Management Note (Signed)
Case Management Note  Patient Details  Name: Lori Wilson MRN: [id number] Date of Birth: 03-30-1935  Subjective/Objective:                    Action/Plan: Pt discharging home with orders for [facility] services. CM met with the patient and provided her a list of [facility] agencies. She selected [facility]. Mary with [facility] notified and accepted the referral. PT recommending a cane. Pt states she has a cane at home. Pt also states she has transportation home.   Expected Discharge Date:  08/13/16               Expected Discharge Plan:  [place]  In-House Referral:     Discharge planning Services  CM Consult  Post Acute Care Choice:  Home Health Choice offered to:  Patient  DME Arranged:    DME Agency:     HH Arranged:  PT [place]:  [facility] (now [facility])  Status of Service:  Completed, signed off  If discussed at [employer] of Stay Meetings, dates discussed:    Additional Comments:  Pollie Friar, RN 08/13/2016, 12:13 PM

## 2016-08-13 NOTE — Progress Notes (Signed)
CCMD notified RN that patient has missed beats multiple times an hour. Patient is asymptomatic. MD notified/ MD also notified that EMS report afib, no afib was seen on tele strip. Continue with discharge per md.

## 2016-08-13 NOTE — Progress Notes (Signed)
RN discussed discharge instructions with patient including f/u pcp visit in 2 weeks. Understands meclizine indication, route, frequency, dosage. Given prescription. Neuro assessment unchanged. Understands next dosage of all medications. Will continue to monitor.

## 2016-08-13 NOTE — Discharge Summary (Signed)
Physician Discharge Summary  Lori Wilson [id number] DOB: 1935/04/26 DOA: 08/12/2016  PCP: Lori Spurling, MD  Admit date: 08/12/2016 Discharge date: 08/13/2016  Admitted From: home Disposition:  Home with HHPT  Recommendations for Outpatient Follow-up:  1. Follow up with PCP in 1-2 weeks 2. Meclizine PRN on discharge  Home Health: PT  Discharge Condition: stable CODE STATUS: Full code Diet recommendation: regular  HPI: Per Lori Mayhew, PA-C Lori Wilson is a 82 y.o. female with a history of HTN, HLD, gout, brought to the ED after experiencing an episode of dizziness versus vertigo while in bed around 7:30 am. She had a similar episode  2 weeks ago while in church, but she failed to be seen as outpatient. She described today's symptoms as "room spinning around ". Symptoms have not completely resolved. EMS stated the patient was in AFib but this is unclear as patient has been on SR since admission and denies any prior history of irregular heart beat. Patient denies any chest pain or palpitations. She denies any headaches, or new vision changes. No dysarthria or this idea. Denies any unilateral weakness or sensory deficiencies. She denies any falls or trauma to the head. No confusion or seizures were reported. Denies any  shortness of breath. Denies any fever or chills, or night sweats. Denies any abdominal pain, or diarrhea. Denies any sick contacts or new foods. Denies any recent long distance trip, rushes or tick bites.  No recent surgeries. Denies lower extremity swelling. Denies  neuropathy. Compliant with her medications.+ fam Hx stroke in father and CHF in mother   Hospital Course: Discharge Diagnoses:  Active Problems:   Hypertension   High cholesterol   Tremor   Dizziness   Vertigo   Giant cell arteritis (HCC)  Vertigo / BPPV - Patient was admitted to the hospital with vertigo. Given broad differential, she underwent an MRI of the brain which was negative for  stroke or any other acute findings, it did show chronic microvascular ischemic changes and volume loss progressed from 2012. She also underwent a 2-D echocardiogram which was normal, had normal ejection fraction of 60-65% without any wall motion abnormalities. Diastolic function was normal as well. She also underwent carotid duplex which did not show any significant stenosis. Neurology was consulted and has evaluated patient, with a negative MRI did not recommend any further workup from a neurologic standpoint. Her symptoms resolved, she was back to baseline, she worked with physical therapy recommended home health PT which was arranged prior to discharge. She was placed on meclizine as needed.  GCA - on steroids, outpatient management. Patient without any headaches, visual changes or any symptoms related to this Hypertension - continue home medications on discharge, continue aspirin HLD - resume statin CKD II - Cr stable Gout - no flares, continue allopurinol   Discharge Instructions   Allergies as of 08/13/2016   No Known Allergies     Medication List    TAKE these medications   acetaminophen 325 MG tablet Commonly known as:  TYLENOL Take 650 mg by mouth every 6 (six) hours as needed for mild pain.   allopurinol 100 MG tablet Commonly known as:  ZYLOPRIM Take 1 tablet by mouth daily.   amLODipine 5 MG tablet Commonly known as:  NORVASC Take 5 mg by mouth daily.   aspirin 81 MG tablet Take 81 mg by mouth daily.   atorvastatin 10 MG tablet Commonly known as:  LIPITOR Take 10 mg by mouth daily.   CALCIUM  600 + D PO Take 1 tablet by mouth daily.   dorzolamide 2 % ophthalmic solution Commonly known as:  TRUSOPT Place 1 drop into the left eye 2 (two) times daily.   Fish Oil 1000 MG Caps Take 1,000 mg by mouth daily.   furosemide 20 MG tablet Commonly known as:  LASIX Take 10 mg by mouth daily.   hydroxypropyl methylcellulose / hypromellose 2.5 % ophthalmic  solution Commonly known as:  ISOPTO TEARS / GONIOVISC Place 1 drop into both eyes 3 (three) times daily as needed for dry eyes.   meclizine 12.5 MG tablet Commonly known as:  ANTIVERT Take 1 tablet (12.5 mg total) by mouth 2 (two) times daily as needed for dizziness.   potassium chloride 10 MEQ tablet Commonly known as:  K-DUR Take 10 mEq by mouth daily.   predniSONE 5 MG tablet Commonly known as:  DELTASONE Take 5 mg by mouth daily with breakfast.      Follow-up Information    Lori Spurling, MD. Schedule an appointment as soon as possible for a visit in 2 week(s).   Specialty:  Internal Medicine Contact information: [address] Lori Wilson [place] [address] [phone]          No Known Allergies  Consultations:  Neurology   Procedures/Studies:  2D echo Study Conclusions - Left ventricle: The cavity size was normal. There was mild concentric hypertrophy. Systolic function was normal. The estimated ejection fraction was in the range of 60% to 65%. Wall motion was normal; there were no regional wall motion abnormalities. Left ventricular diastolic function parameters were normal. - Aortic valve: Poorly visualized. There was mild regurgitation. - Aorta: Aortic root dimension: 42 mm (ED). - Aortic root: The aortic root was mildly dilated. - Pulmonic valve: There was trivial regurgitation.  Ct Head Wo Contrast  Result Date: 08/12/2016 CLINICAL DATA:  Dizziness EXAM: CT HEAD WITHOUT CONTRAST TECHNIQUE: Contiguous axial images were obtained from the base of the skull through the vertex without intravenous contrast. COMPARISON:  06/09/2012 FINDINGS: Brain: There is atrophy and chronic small vessel disease changes. No acute intracranial abnormality. Specifically, no hemorrhage, hydrocephalus, mass lesion, acute infarction, or significant intracranial injury. Vascular: No hyperdense vessel or unexpected calcification. Skull: No acute calvarial abnormality.  Sinuses/Orbits: Visualized paranasal sinuses and mastoids clear. Orbital soft tissues unremarkable. Other: None IMPRESSION: No acute intracranial abnormality. Atrophy, chronic microvascular disease. Electronically Signed   By: Lori Wilson M.D.   On: 08/12/2016 10:54   Mr Brain Wo Contrast  Result Date: 08/12/2016 CLINICAL DATA:  82 y/o  F; dizziness versus vertigo. EXAM: MRI HEAD WITHOUT CONTRAST MRA HEAD WITHOUT CONTRAST TECHNIQUE: Multiplanar, multiecho pulse sequences of the brain and surrounding structures were obtained without intravenous contrast. Angiographic images of the head were obtained using MRA technique without contrast. COMPARISON:  08/12/2016 CT head.  [id number] MRI head. FINDINGS: MRI HEAD FINDINGS Brain: No acute infarction, hemorrhage, hydrocephalus, extra-axial collection or mass lesion. T2 FLAIR hyperintense signal abnormality in subcortical and periventricular white matter is consistent with moderate chronic microvascular ischemic changes and a mildly progressed from 2012. Moderate diffuse brain parenchymal volume loss. Vascular: As below. Skull and upper cervical spine: Normal marrow signal. Sinuses/Orbits: Negative.  Bilateral intra-ocular lens replacement. Other: None. MRA HEAD FINDINGS Internal carotid arteries:  Patent. Anterior cerebral arteries:  Patent. Middle cerebral arteries: Patent. Anterior communicating artery: Patent. Posterior communicating arteries: Patent left. No right identified, likely hypoplastic or absent. Posterior cerebral arteries:  Patent. Basilar artery:  Patent. Vertebral arteries:  Patent. No evidence of high-grade stenosis, large vessel occlusion, or aneurysm unless noted above. IMPRESSION: 1. No acute intracranial abnormality. 2. Normal MRI of the head. 3. Moderate chronic microvascular ischemic changes and parenchymal volume loss of the brain are mildly progressed from 2012. Electronically Signed   By: Kristine Garbe M.D.   On: 08/12/2016 16:15    Dg Chest Portable 1 View  Result Date: 08/12/2016 CLINICAL DATA:  Vertigo EXAM: PORTABLE CHEST 1 VIEW COMPARISON:  12/25/2011 FINDINGS: Heart is upper limits normal in size. No confluent airspace opacities or effusions. No acute bony abnormality. IMPRESSION: No active disease. Electronically Signed   By: Lori Wilson M.D.   On: 08/12/2016 09:01   Mr Jodene Nam Head/brain [id number] Cm  Result Date: 08/12/2016 CLINICAL DATA:  82 y/o  F; dizziness versus vertigo. EXAM: MRI HEAD WITHOUT CONTRAST MRA HEAD WITHOUT CONTRAST TECHNIQUE: Multiplanar, multiecho pulse sequences of the brain and surrounding structures were obtained without intravenous contrast. Angiographic images of the head were obtained using MRA technique without contrast. COMPARISON:  08/12/2016 CT head.  [id number] MRI head. FINDINGS: MRI HEAD FINDINGS Brain: No acute infarction, hemorrhage, hydrocephalus, extra-axial collection or mass lesion. T2 FLAIR hyperintense signal abnormality in subcortical and periventricular white matter is consistent with moderate chronic microvascular ischemic changes and a mildly progressed from 2012. Moderate diffuse brain parenchymal volume loss. Vascular: As below. Skull and upper cervical spine: Normal marrow signal. Sinuses/Orbits: Negative.  Bilateral intra-ocular lens replacement. Other: None. MRA HEAD FINDINGS Internal carotid arteries:  Patent. Anterior cerebral arteries:  Patent. Middle cerebral arteries: Patent. Anterior communicating artery: Patent. Posterior communicating arteries: Patent left. No right identified, likely hypoplastic or absent. Posterior cerebral arteries:  Patent. Basilar artery:  Patent. Vertebral arteries:  Patent. No evidence of high-grade stenosis, large vessel occlusion, or aneurysm unless noted above. IMPRESSION: 1. No acute intracranial abnormality. 2. Normal MRI of the head. 3. Moderate chronic microvascular ischemic changes and parenchymal volume loss of the brain are mildly progressed from  2012. Electronically Signed   By: Kristine Garbe M.D.   On: 08/12/2016 16:15     Subjective: - no chest pain, shortness of breath, no abdominal pain, nausea or vomiting.   Discharge Exam: Vitals:   08/13/16 0315 08/13/16 0900  BP: 100/62 113/60  Pulse: 69 69  Resp: 16   Temp: 98.1 F (36.7 C) 97.7 F (36.5 C)   Vitals:   08/12/16 2315 08/13/16 0115 08/13/16 0315 08/13/16 0900  BP: 115/65 117/66 100/62 113/60  Pulse: 69 70 69 69  Resp: 17 16 16    Temp: 98.1 F (36.7 C) 97.5 F (36.4 C) 98.1 F (36.7 C) 97.7 F (36.5 C)  TempSrc: Oral Oral Oral Oral  SpO2: 100% 99% 100% 97%  Weight:      Height:        General: Pt is alert, awake, not in acute distress Cardiovascular: RRR, S1/S2 +, no rubs, no gallops Respiratory: CTA bilaterally, no wheezing, no rhonchi Abdominal: Soft, NT, ND, bowel sounds +    The results of significant diagnostics from this hospitalization (including imaging, microbiology, ancillary and laboratory) are listed below for reference.     Microbiology: Recent Results (from the past 240 hour(s))  Urine culture     Status: Abnormal   Collection Time: 08/12/16  9:16 AM  Result Value Ref Range Status   Specimen Description URINE, RANDOM  Final   Special Requests NONE  Final   Culture <10,000 COLONIES/mL INSIGNIFICANT GROWTH (A)  Final   Report Status 08/13/2016  FINAL  Final     Labs: BNP (last 3 results) No results for input(s): BNP in the last 8760 hours. Basic Metabolic Panel:  Recent Labs Lab 08/12/16 1042 08/13/16 0155  NA 138 138  K 3.8 3.5  CL 107 108  CO2 22 23  GLUCOSE 88 94  BUN 20 14  CREATININE 1.06* 0.87  CALCIUM 9.4 8.9   Liver Function Tests:  Recent Labs Lab 08/12/16 1042 08/13/16 0155  AST 20 16  ALT 15 13*  ALKPHOS 71 63  BILITOT 0.4 0.3  PROT 6.7 5.8*  ALBUMIN 2.8* 2.5*   No results for input(s): LIPASE, AMYLASE in the last 168 hours. No results for input(s): AMMONIA in the last 168  hours. CBC:  Recent Labs Lab 08/12/16 0948 08/12/16 1425 08/13/16 0155  WBC 5.1 5.0 5.6  NEUTROABS 2.1  --   --   HGB 12.9 12.0 10.6*  HCT 39.9 36.9 32.7*  MCV 89.3 87.9 88.4  PLT PLATELET CLUMPS NOTED ON SMEAR, UNABLE TO ESTIMATE 271  268 241   Cardiac Enzymes:  Recent Labs Lab 08/12/16 1042 08/12/16 2003 08/13/16 0155 08/13/16 0735  TROPONINI <0.03 <0.03 <0.03 <0.03   BNP: Invalid input(s): POCBNP CBG: No results for input(s): GLUCAP in the last 168 hours. D-Dimer No results for input(s): DDIMER in the last 72 hours. Hgb A1c No results for input(s): HGBA1C in the last 72 hours. Lipid Profile  Recent Labs  08/13/16 0155  CHOL 189  HDL 45  LDLCALC 122*  TRIG 111  CHOLHDL 4.2   Thyroid function studies No results for input(s): TSH, T4TOTAL, T3FREE, THYROIDAB in the last 72 hours.  Invalid input(s): FREET3 Anemia work up No results for input(s): VITAMINB12, FOLATE, FERRITIN, TIBC, IRON, RETICCTPCT in the last 72 hours. Urinalysis    Component Value Date/Time   COLORURINE YELLOW 08/12/2016 0916   APPEARANCEUR HAZY (A) 08/12/2016 0916   LABSPEC 1.014 08/12/2016 0916   PHURINE 6.0 08/12/2016 0916   GLUCOSEU NEGATIVE 08/12/2016 0916   HGBUR NEGATIVE 08/12/2016 0916   BILIRUBINUR NEGATIVE 08/12/2016 0916   KETONESUR NEGATIVE 08/12/2016 0916   PROTEINUR NEGATIVE 08/12/2016 0916   UROBILINOGEN 1.0 04/26/2015 1028   NITRITE NEGATIVE 08/12/2016 0916   LEUKOCYTESUR TRACE (A) 08/12/2016 0916   Sepsis Labs Invalid input(s): PROCALCITONIN,  WBC,  LACTICIDVEN Microbiology Recent Results (from the past 240 hour(s))  Urine culture     Status: Abnormal   Collection Time: 08/12/16  9:16 AM  Result Value Ref Range Status   Specimen Description URINE, RANDOM  Final   Special Requests NONE  Final   Culture <10,000 COLONIES/mL INSIGNIFICANT GROWTH (A)  Final   Report Status 08/13/2016 FINAL  Final     Time coordinating discharge: Over 30  minutes  SIGNED:  Marzetta Board, MD  [facility] 08/13/2016, 3:21 PM Pager [phone]  If 7PM-7AM, please contact night-coverage [email or web address] Password [email or web address]

## 2016-08-13 NOTE — Evaluation (Signed)
Physical Therapy Evaluation Patient Details Name: Lori Wilson MRN: [id number] DOB: 02/09/1935 Today's Date: 08/13/2016   History of Present Illness  82 yo female with onset of dizziness and a-fib was admitted, has now a negative MRI for stroke and referred to PT.  PMHx:  HTN, gout,   Clinical Impression  Pt is up to walk with assistance, has been able to walk with independence prior to this admission.  She is demonstrating awareness of safety issues but will need to get some additional assistance at home for transition from hosp safely.  Her dizziness was not present during tx today and O2 sats are 96% after walking.  Follow acutely for LE strength and standing balance control during gait.      Follow Up Recommendations Home health PT;Supervision - Intermittent    Equipment Recommendations  Kasandra Knudsen (has one per her report)    Recommendations for Other Services       Precautions / Restrictions Precautions Precautions: Fall (telemetry) Restrictions Weight Bearing Restrictions: No      Mobility  Bed Mobility Overal bed mobility: Needs Assistance Bed Mobility: Supine to Sit     Supine to sit: Min assist     General bed mobility comments: mainly requires assistance for trunk support to sit side of bed  Transfers Overall transfer level: Needs assistance Equipment used: Rolling walker (2 wheeled);1 person hand held assist Transfers: Sit to/from [facility] Sit to Stand: Min assist Stand pivot transfers: Min assist       General transfer comment: cues for hand placement  Ambulation/Gait Ambulation/Gait assistance: Min assist Ambulation Distance (Feet): 80 Feet Assistive device: 1 person hand held assist (IV pole) Gait Pattern/deviations: Step-through pattern;Decreased stride length;Wide base of support;Trunk flexed Gait velocity: reduced Gait velocity interpretation: Below normal speed for age/gender General Gait Details: wide turns  Stairs             Wheelchair Mobility    Modified Rankin (Stroke Patients Only)       Balance Overall balance assessment: Needs assistance Sitting-balance support: Feet supported Sitting balance-Leahy Scale: Good   Postural control: Posterior lean Standing balance support: Single extremity supported Standing balance-Leahy Scale: Fair                               Pertinent Vitals/Pain Pain Assessment: No/denies pain    Home Living Family/patient expects to be discharged to:: Private residence   Available Help at Discharge: Family;Available PRN/intermittently Type of Home: House Home Access: Level entry     Home Layout: One level Home Equipment: Cane - single point      Prior Function Level of Independence: Independent (has not previously used her cane)               Hand Dominance        Extremity/Trunk Assessment   Upper Extremity Assessment Upper Extremity Assessment: Overall WFL for tasks assessed    Lower Extremity Assessment Lower Extremity Assessment: Generalized weakness    Cervical / Trunk Assessment Cervical / Trunk Assessment: Normal  Communication   Communication: No difficulties  Cognition Arousal/Alertness: Awake/alert Behavior During Therapy: WFL for tasks assessed/performed Overall Cognitive Status: Within Functional Limits for tasks assessed                      General Comments General comments (skin integrity, edema, etc.): pt is up to walk with minor assistance and cautioned her to use a  SPC at home, HHPT to follow which she agrees    Exercises     Assessment/Plan    PT Assessment Patient needs continued PT services  PT Problem List Decreased strength;Decreased range of motion;Decreased activity tolerance;Decreased balance;Decreased mobility;Decreased coordination;Decreased knowledge of use of DME;Decreased safety awareness;Obesity          PT Treatment Interventions DME instruction;Gait training;Functional  mobility training;Therapeutic activities;Therapeutic exercise;Balance training;Neuromuscular re-education;Patient/family education    PT Goals (Current goals can be found in the Care Plan section)  Acute Rehab PT Goals Patient Stated Goal: to get home PT Goal Formulation: With patient Time For Goal Achievement: 08/27/16 Potential to Achieve Goals: Good    Frequency Min 2X/week   Barriers to discharge Decreased caregiver support home independently    Co-evaluation               End of Session Equipment Utilized During Treatment: Gait belt Activity Tolerance: Patient tolerated treatment well Patient left: in chair;with call bell/phone within reach;with chair alarm set Nurse Communication: Mobility status    Functional Assessment Tool Used: clinical judgment Functional Limitation: Mobility: Walking and moving around Mobility: Walking and Moving Around Current Status [phone]): At least 20 percent but less than 40 percent impaired, limited or restricted Mobility: Walking and Moving Around Goal Status [phone]): At least 1 percent but less than 20 percent impaired, limited or restricted    Time: 0820-0847 PT Time Calculation (min) (ACUTE ONLY): 27 min   Charges:   PT Evaluation $PT Eval Low Complexity: 1 Procedure PT Treatments $Gait Training: 8-22 mins   PT G Codes:   PT G-Codes **NOT FOR INPATIENT CLASS** Functional Assessment Tool Used: clinical judgment Functional Limitation: Mobility: Walking and moving around Mobility: Walking and Moving Around Current Status [id number]): At least 20 percent but less than 40 percent impaired, limited or restricted Mobility: Walking and Moving Around Goal Status [phone]): At least 1 percent but less than 20 percent impaired, limited or restricted    Ramond Dial 08/13/2016, 11:03 AM   Mee Hives, PT MS Acute Rehab Dept. Number: [place] [facility] [place]

## 2016-08-13 NOTE — Discharge Instructions (Signed)
Follow with Foye Spurling, MD in 1-2 weeks  Please get a complete blood count and chemistry panel checked by your Primary MD at your next visit, and again as instructed by your Primary MD. Please get your medications reviewed and adjusted by your Primary MD.  Please request your Primary MD to go over all Hospital Tests and Procedure/Radiological results at the follow up, please get all Hospital records sent to your Prim MD by signing hospital release before you go home.  If you had Pneumonia of Lung problems at the Hospital: Please get a 2 view Chest X ray done in 6-8 weeks after hospital discharge or sooner if instructed by your Primary MD.  If you have Congestive Heart Failure: Please call your Cardiologist or Primary MD anytime you have any of the following symptoms:  1) 3 pound weight gain in 24 hours or 5 pounds in 1 week  2) shortness of breath, with or without a dry hacking cough  3) swelling in the hands, feet or stomach  4) if you have to sleep on extra pillows at night in order to breathe  Follow cardiac low salt diet and 1.5 lit/day fluid restriction.  If you have diabetes Accuchecks 4 times/day, Once in AM empty stomach and then before each meal. Log in all results and show them to your primary doctor at your next visit. If any glucose reading is under 80 or above 300 call your primary MD immediately.  If you have Seizure/Convulsions/Epilepsy: Please do not drive, operate heavy machinery, participate in activities at heights or participate in high speed sports until you have seen by Primary MD or a Neurologist and advised to do so again.  If you had Gastrointestinal Bleeding: Please ask your Primary MD to check a complete blood count within one week of discharge or at your next visit. Your endoscopic/colonoscopic biopsies that are pending at the time of discharge, will also need to followed by your Primary MD.  Get Medicines reviewed and adjusted. Please take all your  medications with you for your next visit with your Primary MD  Please request your Primary MD to go over all hospital tests and procedure/radiological results at the follow up, please ask your Primary MD to get all Hospital records sent to his/her office.  If you experience worsening of your admission symptoms, develop shortness of breath, life threatening emergency, suicidal or homicidal thoughts you must seek medical attention immediately by calling 911 or calling your MD immediately  if symptoms less severe.  You must read complete instructions/literature along with all the possible adverse reactions/side effects for all the Medicines you take and that have been prescribed to you. Take any new Medicines after you have completely understood and accpet all the possible adverse reactions/side effects.   Do not drive or operate heavy machinery when taking Pain medications.   Do not take more than prescribed Pain, Sleep and Anxiety Medications  Special Instructions: If you have smoked or chewed Tobacco  in the last 2 yrs please stop smoking, stop any regular Alcohol  and or any Recreational drug use.  Wear Seat belts while driving.  Please note You were cared for by a hospitalist during your hospital stay. If you have any questions about your discharge medications or the care you received while you were in the hospital after you are discharged, you can call the unit and asked to speak with the hospitalist on call if the hospitalist that took care of you is not available. Once  you are discharged, your primary care physician will handle any further medical issues. Please note that NO REFILLS for any discharge medications will be authorized once you are discharged, as it is imperative that you return to your primary care physician (or establish a relationship with a primary care physician if you do not have one) for your aftercare needs so that they can reassess your need for medications and monitor your  lab values.  You can reach the hospitalist office at phone [phone] or fax [phone]   If you do not have a primary care physician, you can call [phone] for a physician referral.  Activity: As tolerated with Full fall precautions use walker/cane & assistance as needed  Diet: regular  Disposition Home  Vertigo Vertigo is the feeling that you or your surroundings are moving when they are not. Vertigo can be dangerous if it occurs while you are doing something that could endanger you or others, such as driving. What are the causes? This condition is caused by a disturbance in the signals that are sent by your bodys sensory systems to your brain. Different causes of a disturbance can lead to vertigo, including:  Infections, especially in the inner ear.  A bad reaction to a drug, or misuse of alcohol and medicines.  Withdrawal from drugs or alcohol.  Quickly changing positions, as when lying down or rolling over in bed.  Migraine headaches.  Decreased blood flow to the brain.  Decreased blood pressure.  Increased pressure in the brain from a head or neck injury, stroke, infection, tumor, or bleeding.  Central nervous system disorders. What are the signs or symptoms? Symptoms of this condition usually occur when you move your head or your eyes in different directions. Symptoms may start suddenly, and they usually last for less than a minute. Symptoms may include:  Loss of balance and falling.  Feeling like you are spinning or moving.  Feeling like your surroundings are spinning or moving.  Nausea and vomiting.  Blurred vision or double vision.  Difficulty hearing.  Slurred speech.  Dizziness.  Involuntary eye movement (nystagmus). Symptoms can be mild and cause only slight annoyance, or they can be severe and interfere with daily life. Episodes of vertigo may return (recur) over time, and they are often triggered by certain movements. Symptoms may improve over  time. How is this diagnosed? This condition may be diagnosed based on medical history and the quality of your nystagmus. Your health care provider may test your eye movements by asking you to quickly change positions to trigger the nystagmus. This may be called the Dix-Hallpike test, head thrust test, or roll test. You may be referred to a health care provider who specializes in ear, nose, and throat (ENT) problems (otolaryngologist) or a provider who specializes in disorders of the central nervous system (neurologist). You may have additional testing, including:  A physical exam.  Blood tests.  MRI.  A CT scan.  An electrocardiogram (ECG). This records electrical activity in your heart.  An electroencephalogram (EEG). This records electrical activity in your brain.  Hearing tests. How is this treated? Treatment for this condition depends on the cause and the severity of the symptoms. Treatment options include:  Medicines to treat nausea or vertigo. These are usually used for severe cases. Some medicines that are used to treat other conditions may also reduce or eliminate vertigo symptoms. These include:  Medicines that control allergies (antihistamines).  Medicines that control seizures (anticonvulsants).  Medicines that relieve depression (antidepressants).  Medicines that relieve anxiety (sedatives).  Head movements to adjust your inner ear back to normal. If your vertigo is caused by an ear problem, your health care provider may recommend certain movements to correct the problem.  Surgery. This is rare. Follow these instructions at home: Safety  Move slowly.Avoid sudden body or head movements.  Avoid driving.  Avoid operating heavy machinery.  Avoid doing any tasks that would cause danger to you or others if you would have a vertigo episode during the task.  If you have trouble walking or keeping your balance, try using a cane for stability. If you feel dizzy or  unstable, sit down right away.  Return to your normal activities as told by your health care provider. Ask your health care provider what activities are safe for you. General instructions  Take over-the-counter and prescription medicines only as told by your health care provider.  Avoid certain positions or movements as told by your health care provider.  Drink enough fluid to keep your urine clear or pale yellow.  Keep all follow-up visits as told by your health care provider. This is important. Contact a health care provider if:  Your medicines do not relieve your vertigo or they make it worse.  You have a fever.  Your condition gets worse or you develop new symptoms.  Your family or friends notice any behavioral changes.  Your nausea or vomiting gets worse.  You have numbness or a pins and needles sensation in part of your body. Get help right away if:  You have difficulty moving or speaking.  You are always dizzy.  You faint.  You develop severe headaches.  You have weakness in your hands, arms, or legs.  You have changes in your hearing or vision.  You develop a stiff neck.  You develop sensitivity to light. This information is not intended to replace advice given to you by your health care provider. Make sure you discuss any questions you have with your health care provider. Document Released: 04/11/2005 Document Revised: 12/14/2015 Document Reviewed: 10/25/2014 Elsevier Interactive Patient Education  2017 [place] tablets or capsules What is this medicine? MECLIZINE (MEK li zeen) is an antihistamine. It is used to prevent nausea, vomiting, or dizziness caused by motion sickness. It is also used to prevent and treat vertigo (extreme dizziness or a feeling that you or your surroundings are tilting or spinning around). This medicine may be used for other purposes; ask your health care provider or pharmacist if you have questions. COMMON BRAND  NAME(S): Antivert, Dramamine Less Drowsy, Medivert, Meni-D What should I tell my health care provider before I take this medicine? They need to know if you have any of these conditions: -glaucoma -lung or breathing disease, like asthma -problems urinating -prostate disease -stomach or intestine problems -an unusual or allergic reaction to meclizine, other medicines, foods, dyes, or preservatives -pregnant or trying to get pregnant -breast-feeding How should I use this medicine? Take this medicine by mouth with a glass of water. Follow the directions on the prescription label. If you are using this medicine to prevent motion sickness, take the dose at least 1 hour before travel. If it upsets your stomach, take it with food or milk. Take your doses at regular intervals. Do not take your medicine more often than directed. Talk to your pediatrician regarding the use of this medicine in children. Special care may be needed. Overdosage: If you think you have taken too much of this medicine  contact a poison control center or emergency room at once. NOTE: This medicine is only for you. Do not share this medicine with others. What if I miss a dose? If you miss a dose, take it as soon as you can. If it is almost time for your next dose, take only that dose. Do not take double or extra doses. What may interact with this medicine? Do not take this medicine with any of the following medications: -MAOIs like Carbex, Eldepryl, Marplan, Nardil, and Parnate This medicine may also interact with the following medications: -alcohol -antihistamines for allergy, cough and cold -certain medicines for anxiety or sleep -certain medicines for depression, like amitriptyline, fluoxetine, sertraline -certain medicines for seizures like phenobarbital, primidone -general anesthetics like halothane, isoflurane, methoxyflurane, propofol -local anesthetics like lidocaine, pramoxine, tetracaine -medicines that relax  muscles for surgery -narcotic medicines for pain -phenothiazines like chlorpromazine, mesoridazine, prochlorperazine, thioridazine This list may not describe all possible interactions. Give your health care provider a list of all the medicines, herbs, non-prescription drugs, or dietary supplements you use. Also tell them if you smoke, drink alcohol, or use illegal drugs. Some items may interact with your medicine. What should I watch for while using this medicine? Tell your doctor or healthcare professional if your symptoms do not start to get better or if they get worse. You may get drowsy or dizzy. Do not drive, use machinery, or do anything that needs mental alertness until you know how this medicine affects you. Do not stand or sit up quickly, especially if you are an older patient. This reduces the risk of dizzy or fainting spells. Alcohol may interfere with the effect of this medicine. Avoid alcoholic drinks. Your mouth may get dry. Chewing sugarless gum or sucking hard candy, and drinking plenty of water may help. Contact your doctor if the problem does not go away or is severe. This medicine may cause dry eyes and blurred vision. If you wear contact lenses you may feel some discomfort. Lubricating drops may help. See your eye doctor if the problem does not go away or is severe. What side effects may I notice from receiving this medicine? Side effects that you should report to your doctor or health care professional as soon as possible: -feeling faint or lightheaded, falls -fast, irregular heartbeat Side effects that usually do not require medical attention (report to your doctor or health care professional if they continue or are bothersome): -constipation -headache -trouble passing urine or change in the amount of urine -trouble sleeping -upset stomach This list may not describe all possible side effects. Call your doctor for medical advice about side effects. You may report side effects  to FDA at 1-800-FDA-1088. Where should I keep my medicine? Keep out of the reach of children. Store at room temperature between 15 and 30 degrees C (59 and 86 degrees F). Keep container tightly closed. Throw away any unused medicine after the expiration date. NOTE: This sheet is a summary. It may not cover all possible information. If you have questions about this medicine, talk to your doctor, pharmacist, or health care provider.  2017 Elsevier/Gold Standard (2015-08-03 19:41:02)

## 2016-08-13 NOTE — Progress Notes (Signed)
*  PRELIMINARY RESULTS* Vascular Ultrasound Carotid Duplex (Doppler) has been completed.   Findings suggest 1-39% internal carotid artery stenosis bilaterally. Vertebral arteries are patent with antegrade flow.  08/13/2016 9:48 AM Maudry Mayhew, BS, RVT, RDCS, RDMS

## 2016-08-13 NOTE — Care Management Obs Status (Signed)
[facility] NOTIFICATION   Patient Details  Name: NEHA WALDRIDGE MRN: [id number] Date of Birth: 1934-08-21   Medicare Observation Status Notification Given:  Yes    Pollie Friar, RN 08/13/2016, 11:55 AM

## 2016-08-13 NOTE — Care Management Note (Signed)
Case Management Note  Patient Details  Name: Lori Wilson MRN: [id number] Date of Birth: [birth year]  Subjective/Objective:                 Patient presented with dizziness. Independent with ADLs at home. CM will follow for discharge needs pending PT/OT evals and physician orders.   Action/Plan:   Expected Discharge Date:                  Expected Discharge Plan:     In-House Referral:     Discharge planning Services     Post Acute Care Choice:    Choice offered to:     DME Arranged:    DME Agency:     HH Arranged:    HH Agency:     Status of Service:     If discussed at [employer] of Stay Meetings, dates discussed:    Additional Comments:  Rolm Baptise, RN 08/13/2016, 9:41 AM

## 2016-08-14 LAB — HEMOGLOBIN A1C
Hgb A1c MFr Bld: 6 % — ABNORMAL HIGH (ref 4.8–5.6)
Mean Plasma Glucose: 126 mg/dL

## 2016-09-04 ENCOUNTER — Other Ambulatory Visit: Payer: Self-pay | Admitting: Internal Medicine

## 2016-09-04 DIAGNOSIS — M5416 Radiculopathy, lumbar region: Secondary | ICD-10-CM | POA: Diagnosis not present

## 2016-09-04 DIAGNOSIS — E2839 Other primary ovarian failure: Secondary | ICD-10-CM

## 2016-09-04 DIAGNOSIS — E7800 Pure hypercholesterolemia, unspecified: Secondary | ICD-10-CM | POA: Diagnosis not present

## 2016-09-04 DIAGNOSIS — Z1231 Encounter for screening mammogram for malignant neoplasm of breast: Secondary | ICD-10-CM

## 2016-09-04 DIAGNOSIS — I10 Essential (primary) hypertension: Secondary | ICD-10-CM | POA: Diagnosis not present

## 2016-09-04 DIAGNOSIS — I776 Arteritis, unspecified: Secondary | ICD-10-CM | POA: Diagnosis not present

## 2016-09-21 ENCOUNTER — Ambulatory Visit
Admission: RE | Admit: 2016-09-21 | Discharge: 2016-09-21 | Disposition: A | Discharge status: Home / Self Care | Payer: Medicare Other | Source: Ambulatory Visit | Attending: Internal Medicine | Admitting: Internal Medicine

## 2016-09-21 DIAGNOSIS — Z1231 Encounter for screening mammogram for malignant neoplasm of breast: Secondary | ICD-10-CM

## 2016-09-21 DIAGNOSIS — E2839 Other primary ovarian failure: Secondary | ICD-10-CM

## 2016-09-21 DIAGNOSIS — Z780 Asymptomatic menopausal state: Secondary | ICD-10-CM | POA: Diagnosis not present

## 2016-09-21 DIAGNOSIS — Z13820 Encounter for screening for osteoporosis: Secondary | ICD-10-CM | POA: Diagnosis not present

## 2016-11-22 ENCOUNTER — Encounter: Payer: Self-pay | Admitting: Family Medicine

## 2016-11-22 DIAGNOSIS — E559 Vitamin D deficiency, unspecified: Secondary | ICD-10-CM | POA: Diagnosis not present

## 2016-11-22 DIAGNOSIS — M150 Primary generalized (osteo)arthritis: Secondary | ICD-10-CM | POA: Diagnosis not present

## 2016-11-22 DIAGNOSIS — R768 Other specified abnormal immunological findings in serum: Secondary | ICD-10-CM | POA: Diagnosis not present

## 2016-11-22 DIAGNOSIS — M316 Other giant cell arteritis: Secondary | ICD-10-CM | POA: Diagnosis not present

## 2016-11-22 DIAGNOSIS — M1009 Idiopathic gout, multiple sites: Secondary | ICD-10-CM | POA: Diagnosis not present

## 2016-12-19 DIAGNOSIS — E7800 Pure hypercholesterolemia, unspecified: Secondary | ICD-10-CM | POA: Diagnosis not present

## 2016-12-19 DIAGNOSIS — I10 Essential (primary) hypertension: Secondary | ICD-10-CM | POA: Diagnosis not present

## 2016-12-19 DIAGNOSIS — I776 Arteritis, unspecified: Secondary | ICD-10-CM | POA: Diagnosis not present

## 2016-12-19 DIAGNOSIS — M5416 Radiculopathy, lumbar region: Secondary | ICD-10-CM | POA: Diagnosis not present

## 2017-01-09 ENCOUNTER — Encounter: Payer: Self-pay | Admitting: Family Medicine

## 2017-01-09 DIAGNOSIS — H402211 Chronic angle-closure glaucoma, right eye, mild stage: Secondary | ICD-10-CM | POA: Diagnosis not present

## 2017-01-09 DIAGNOSIS — H353132 Nonexudative age-related macular degeneration, bilateral, intermediate dry stage: Secondary | ICD-10-CM | POA: Diagnosis not present

## 2017-01-09 DIAGNOSIS — H35033 Hypertensive retinopathy, bilateral: Secondary | ICD-10-CM | POA: Diagnosis not present

## 2017-01-09 DIAGNOSIS — H402223 Chronic angle-closure glaucoma, left eye, severe stage: Secondary | ICD-10-CM | POA: Diagnosis not present

## 2017-02-26 DIAGNOSIS — M150 Primary generalized (osteo)arthritis: Secondary | ICD-10-CM | POA: Diagnosis not present

## 2017-02-26 DIAGNOSIS — Z79899 Other long term (current) drug therapy: Secondary | ICD-10-CM | POA: Diagnosis not present

## 2017-04-01 IMAGING — MR MR LUMBAR SPINE W/O CM
4 of 5 series · 18 of 48 positions shown · non-contrast
Comparison: 04/26/2015

CLINICAL DATA: Low back pain radiating down both legs.

EXAM:
MRI LUMBAR SPINE WITHOUT CONTRAST
TECHNIQUE: Multiplanar, multisequence MR imaging of the lumbar spine was
performed. No intravenous contrast was administered.

[Series 3: T2 · sagittal · 4.0mm · 0.55mm/px · 6 of 12 slices shown (1 of 2)]
[im 1/12]
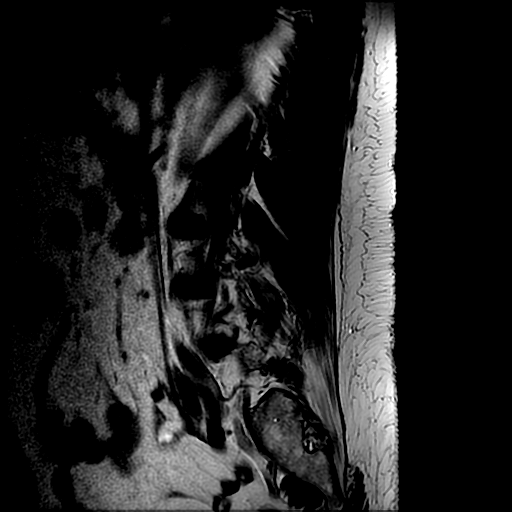
[im 3/12]
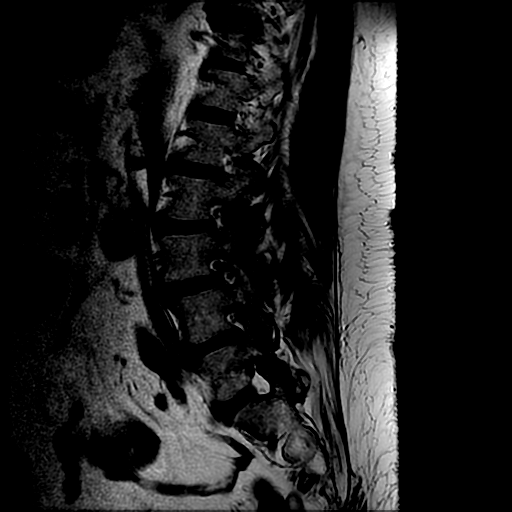
[im 5/12]
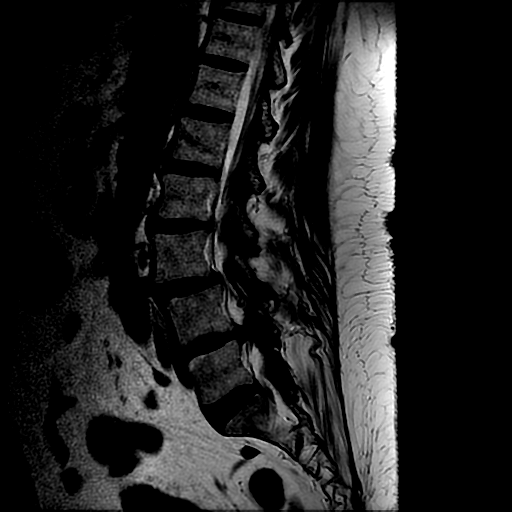
[im 7/12]
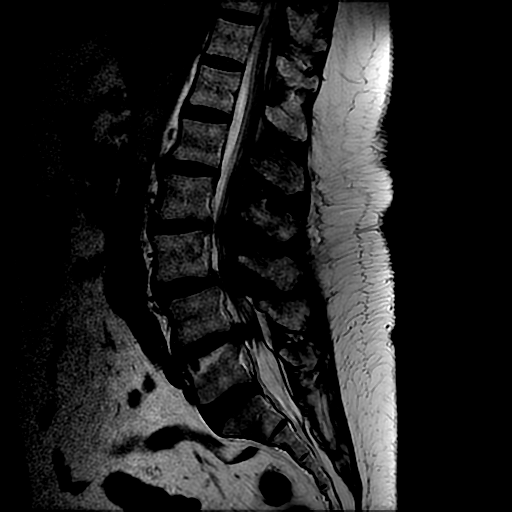
[im 9/12]
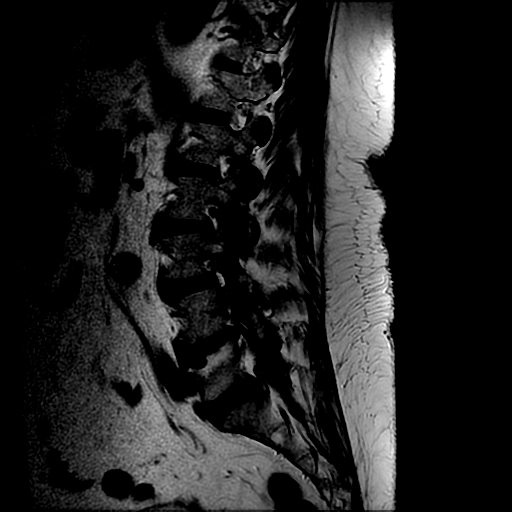
[im 12/12]
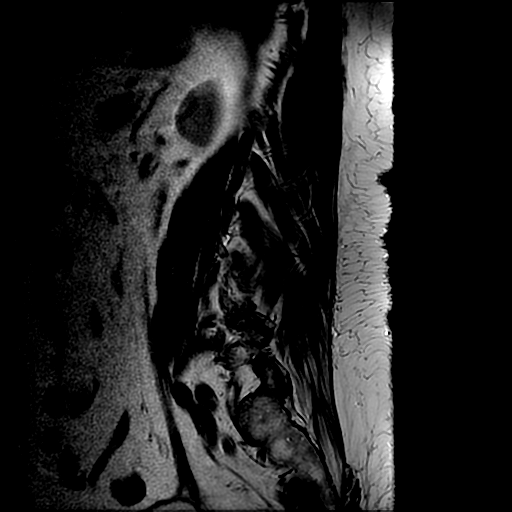

[Series 4: T1 · sagittal · 4.0mm · 0.55mm/px · 3 of 12 slices shown (1 of 2)]
[im 1/12]
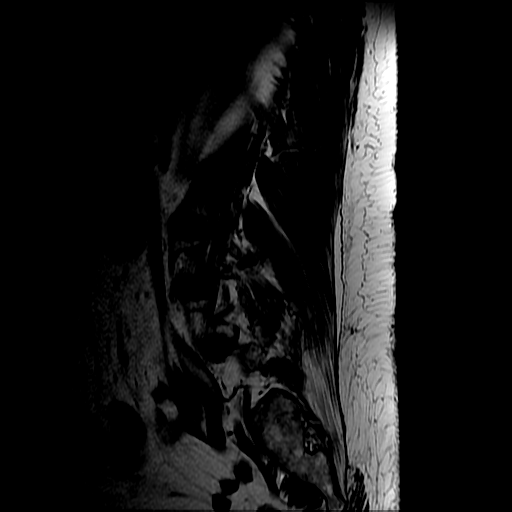
[im 6/12]
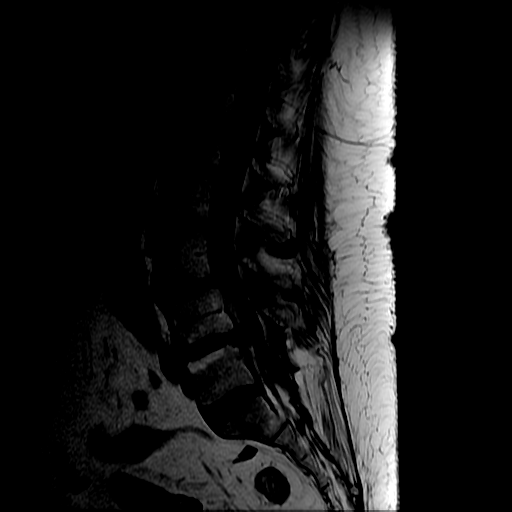
[im 12/12]
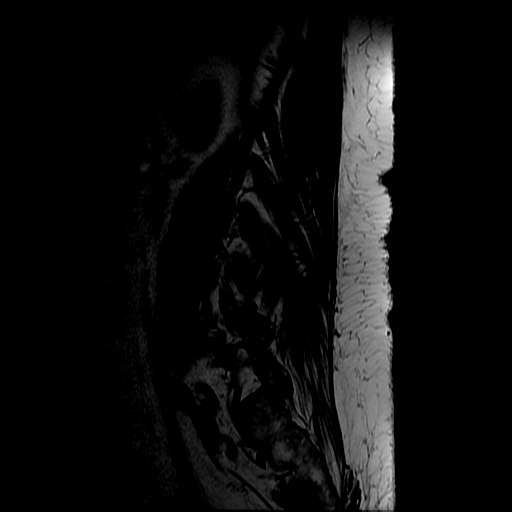

[Series 6: T2 · axial · 4.0mm · 0.39mm/px · z∈[-93,+79]mm · 6 of 36 slices shown (2 of 2)]
[im 3/36]
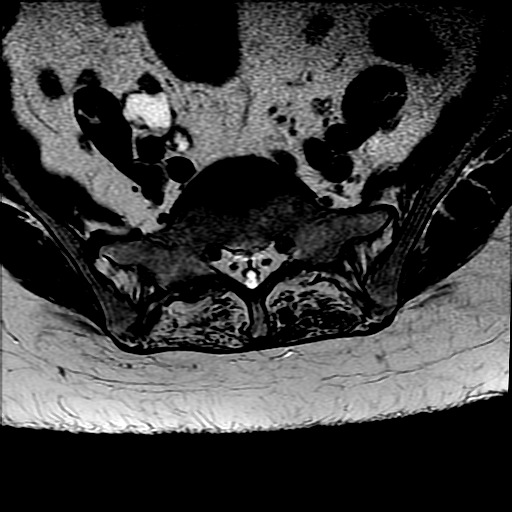
[im 5/36]
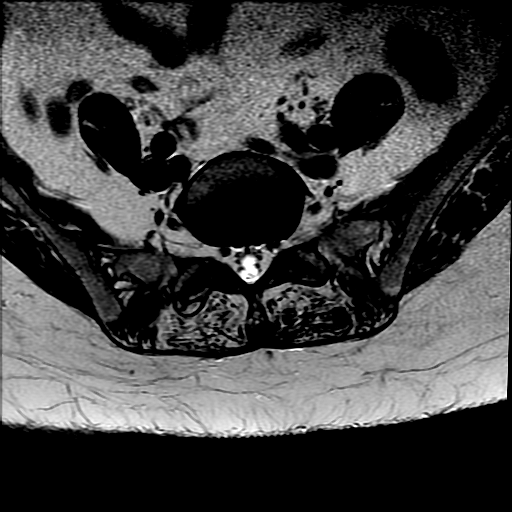
[im 8/36]
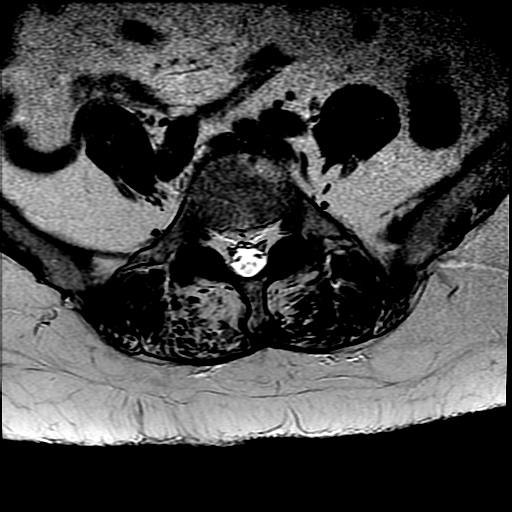
[im 12/36]
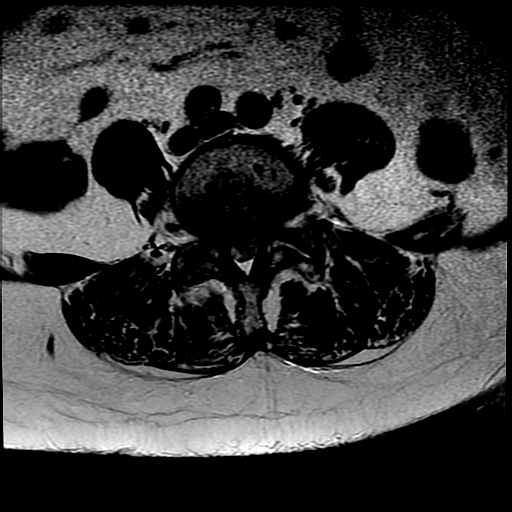
[im 19/36]
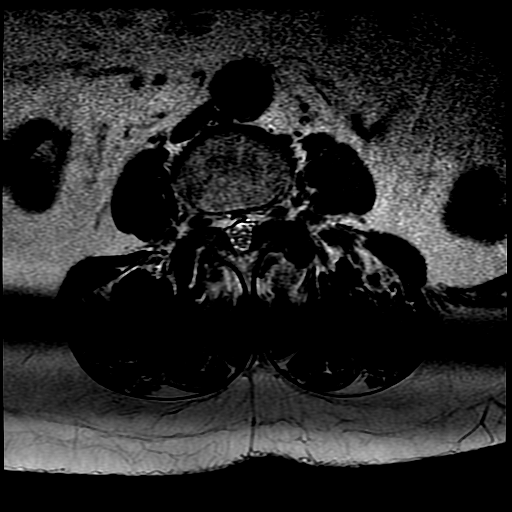
[im 31/36]
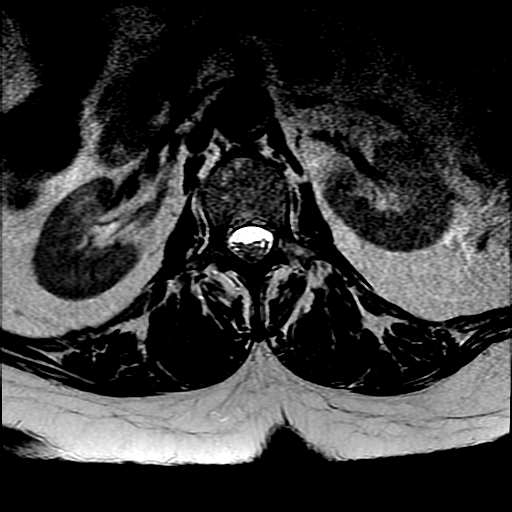

[Series 7: T1 · axial · 4.0mm · 0.39mm/px · z∈[-83,+79]mm · 3 of 36 slices shown (2 of 2)]
[im 5/36]
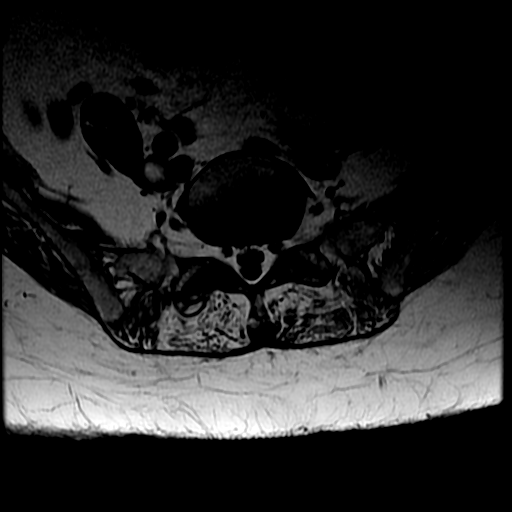
[im 19/36]
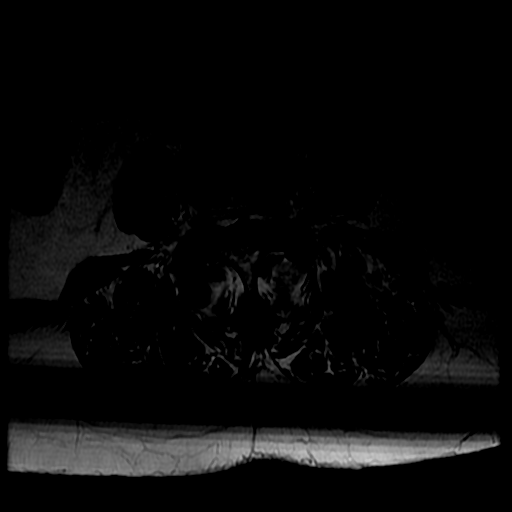
[im 31/36]
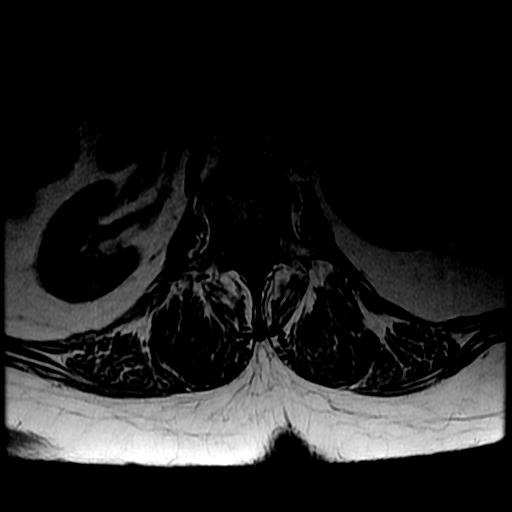

[18 of 48 positions shown; findings below may reference images not displayed]

FINDINGS: The lowest lumbar type non-rib-bearing vertebra is labeled as L5.
The conus medullaris appears normal. Conus level: T12-L1.

There is 4 mm grade 1 anterolisthesis at L3-4.  No pars defects.

Intervertebral disc desiccation is observed at all levels in the
lumbar spine with loss of disc height and vacuum disc phenomenon at
L4-5.

Mild degenerative facet and perifacet edema on the right at L3-4
with facet joint effusion.

A fluid signal intensity lesion of the right kidney is partially
included on today's exam and statistically likely to represent a
cyst although technically nonspecific.

A 2.1 by 1.5 cm lesion with high T2 and high T1 signal
characteristics is present along the right external iliac
vasculature, image 34 series 6, anterior to the right ureter.

Mild prominence of epidural adipose tissue in the lower lumbar
spine. Additional findings at individual levels are as follows:

L1-2:  No impingement.  Right facet arthropathy.

L2-3: Mild bilateral foraminal stenosis and mild displacement of
both L2 nerves in the lateral extraforaminal space due to disc bulge
and facet arthropathy. Borderline central narrowing of the thecal
sac due to encroachment from the facets.

L3-4: Moderate central narrowing of the thecal sac with mild to
moderate right and mild left foraminal stenosis and borderline
bilateral subarticular lateral recess stenosis due to right greater
than left facet arthropathy, disc uncovering, and disc bulge. Right
facet joint effusion.

L4-5: Prominent right and moderate left subarticular lateral recess
stenosis with moderate central narrowing of the thecal sac and mild
bilateral foraminal stenosis due to right lateral recess disc
protrusion, questionable adjacent fragment, facet arthropathy, and
disc bulge.

L5-S1: No impingement. Bilateral facet arthropathy. Mild left
eccentric disc bulge.
IMPRESSION: 1. Lumbar spondylosis and degenerative disc disease, causing
prominent impingement at L4-5, moderate impingement at L3-4, and
mild impingement at L2- 3, as detailed above. The dominant finding
is the right lateral recess disc protrusion at L4-5. Cannot exclude
in adjacent disc fragment.
2. Unusual 2.1 by 1.5 cm lesion with high T2 and high T1 signal
characteristics adjacent to the right external iliac vessels in just
anterior to the right distal ureter. With the high T1 and T2 signal
this could be a proteinaceous cystic lesion or a fatty lesion such
as a small ovarian dermoid. No prior cross-sectional imaging through
this region for comparison purposes. In the absence of a history of
melanoma, I consider this structure highly likely to be benign. If
clinically warranted, pelvic sonography or pelvic MRI could be
utilized for further characterization.

## 2017-04-24 DIAGNOSIS — E7800 Pure hypercholesterolemia, unspecified: Secondary | ICD-10-CM | POA: Diagnosis not present

## 2017-04-24 DIAGNOSIS — R0789 Other chest pain: Secondary | ICD-10-CM | POA: Diagnosis not present

## 2017-04-24 DIAGNOSIS — I10 Essential (primary) hypertension: Secondary | ICD-10-CM | POA: Diagnosis not present

## 2017-06-26 DIAGNOSIS — H169 Unspecified keratitis: Secondary | ICD-10-CM | POA: Diagnosis not present

## 2017-06-26 DIAGNOSIS — H04123 Dry eye syndrome of bilateral lacrimal glands: Secondary | ICD-10-CM | POA: Diagnosis not present

## 2017-06-26 DIAGNOSIS — H402211 Chronic angle-closure glaucoma, right eye, mild stage: Secondary | ICD-10-CM | POA: Diagnosis not present

## 2017-06-26 DIAGNOSIS — H402223 Chronic angle-closure glaucoma, left eye, severe stage: Secondary | ICD-10-CM | POA: Diagnosis not present

## 2017-08-21 ENCOUNTER — Encounter: Payer: Self-pay | Admitting: Family Medicine

## 2017-08-21 ENCOUNTER — Ambulatory Visit (INDEPENDENT_AMBULATORY_CARE_PROVIDER_SITE_OTHER): Payer: Medicare Other | Admitting: Family Medicine

## 2017-08-21 ENCOUNTER — Other Ambulatory Visit: Payer: Self-pay

## 2017-08-21 VITALS — BP 121/79 | HR 77 | Temp 98.4°F | Resp 16 | Ht 66.0 in | Wt 155.6 lb

## 2017-08-21 DIAGNOSIS — M316 Other giant cell arteritis: Secondary | ICD-10-CM

## 2017-08-21 DIAGNOSIS — I10 Essential (primary) hypertension: Secondary | ICD-10-CM | POA: Diagnosis not present

## 2017-08-21 NOTE — Progress Notes (Signed)
Chief Complaint  Patient presents with  . New Patient (Initial Visit)    establish care    HPI   Pt is her to establish care She has a history of temporal giant cell arteritis, hypertension and vertigo No concerns today Her primary care provider retired She has not had any acute vertigo episodes Last episode was a year ago   She had labs done in September and November She sees a [employer] Dr. Otho Ket   Past Medical History:  Diagnosis Date  . High cholesterol   . Hypertension   . Pneumonia     Current Outpatient Medications  Medication Sig Dispense Refill  . acetaminophen (TYLENOL) 325 MG tablet Take 650 mg by mouth every 6 (six) hours as needed for mild pain.    Marland Kitchen allopurinol (ZYLOPRIM) 100 MG tablet Take 1 tablet by mouth daily.  0  . amLODipine (NORVASC) 5 MG tablet Take 5 mg by mouth daily.    Marland Kitchen aspirin 81 MG tablet Take 81 mg by mouth daily.    . dorzolamide (TRUSOPT) 2 % ophthalmic solution Place 1 drop into the left eye 2 (two) times daily.     . Fluocinonide 0.1 % CREA Apply topically.    . furosemide (LASIX) 20 MG tablet Take 10 mg by mouth daily.     . potassium chloride (K-DUR) 10 MEQ tablet Take 10 mEq by mouth daily.    Marland Kitchen atorvastatin (LIPITOR) 10 MG tablet Take 10 mg by mouth daily.    . Calcium Carbonate-Vitamin D (CALCIUM 600 + D PO) Take 1 tablet by mouth daily.    . hydroxypropyl methylcellulose / hypromellose (ISOPTO TEARS / GONIOVISC) 2.5 % ophthalmic solution Place 1 drop into both eyes 3 (three) times daily as needed for dry eyes.    . meclizine (ANTIVERT) 12.5 MG tablet Take 1 tablet (12.5 mg total) by mouth 2 (two) times daily as needed for dizziness. (Patient not taking: Reported on 08/21/2017) 30 tablet 0  . Omega-3 Fatty Acids (FISH OIL) 1000 MG CAPS Take 1,000 mg by mouth daily.     . predniSONE (DELTASONE) 5 MG tablet Take 5 mg by mouth daily with breakfast.     No current facility-administered medications for this visit.     Allergies: No  Known Allergies  Past Surgical History:  Procedure Laterality Date  . BLADDER SURGERY    . catract Left 04/06/2014  . VAGINAL HYSTERECTOMY      Social History   Socioeconomic History  . Marital status: Widowed    Spouse name: None  . Number of children: 4  . Years of education: 84  . Highest education level: None  Social Needs  . Financial resource strain: None  . Food insecurity - worry: None  . Food insecurity - inability: None  . Transportation needs - medical: None  . Transportation needs - non-medical: None  Occupational History    Employer: RETIRED    Comment: Retired  Tobacco Use  . Smoking status: Never Smoker  . Smokeless tobacco: Never Used  Substance and Sexual Activity  . Alcohol use: No    Alcohol/week: 0.0 oz  . Drug use: No  . Sexual activity: None  Other Topics Concern  . None  Social History Narrative   Patient is widowed and her grandson stay with her.    Retired.   Education high school.   Right handed.   Caffeine coffee one cup daily.    Family History  Problem Relation Age of Onset  .  Heart attack Mother   . Diabetes Mother   . Stroke Father   . High blood pressure Father      ROS Review of Systems See HPI Constitution: No fevers or chills No malaise No diaphoresis Skin: No rash or itching Eyes: no blurry vision, no double vision GU: no dysuria or hematuria Neuro: no dizziness or headaches all others reviewed and negative   Objective: Vitals:   08/21/17 0945  BP: 121/79  Pulse: 77  Resp: 16  Temp: 98.4 F (36.9 C)  TempSrc: Oral  SpO2: 100%  Weight: 155 lb 9.6 oz (70.6 kg)  Height: 5\' 6"  (1.676 m)    Physical Exam  Constitutional: She is oriented to person, place, and time. She appears well-developed and well-nourished.  HENT:  Head: Normocephalic and atraumatic.  Eyes: Conjunctivae and EOM are normal.  Cardiovascular: Normal rate, regular rhythm and normal heart sounds.  No murmur heard. Pulmonary/Chest: Effort  normal and breath sounds normal. No stridor. No respiratory distress.  Neurological: She is alert and oriented to person, place, and time.  Skin: Skin is warm. Capillary refill takes less than 2 seconds.  Psychiatric: She has a normal mood and affect. Her behavior is normal. Judgment and thought content normal.     Assessment and Plan Yui was seen today for new patient (initial visit).  Diagnoses and all orders for this visit:  Giant cell arteritis [facility]) Essential hypertension    Stable on her current meds Call for refills Follow up in April 2019 for labs Will send for records from [facility]

## 2017-08-21 NOTE — Patient Instructions (Signed)
     IF you received an x-ray today, you will receive an invoice from [facility]. Please contact [facility] at [phone] with questions or concerns regarding your invoice.   IF you received labwork today, you will receive an invoice from [facility]. Please contact [facility] at [phone] with questions or concerns regarding your invoice.   Our billing staff will not be able to assist you with questions regarding bills from these companies.  You will be contacted with the lab results as soon as they are available. The fastest way to get your results is to activate your My Chart account. Instructions are located on the last page of this paperwork. If you have not heard from us regarding the results in 2 weeks, please contact this office.     

## 2017-08-26 ENCOUNTER — Other Ambulatory Visit: Payer: Self-pay | Admitting: Family Medicine

## 2017-08-26 DIAGNOSIS — Z1231 Encounter for screening mammogram for malignant neoplasm of breast: Secondary | ICD-10-CM

## 2017-09-23 ENCOUNTER — Ambulatory Visit
Admission: RE | Admit: 2017-09-23 | Discharge: 2017-09-23 | Disposition: A | Discharge status: Home / Self Care | Payer: Medicare Other | Source: Ambulatory Visit | Attending: Family Medicine | Admitting: Family Medicine

## 2017-09-23 DIAGNOSIS — Z1231 Encounter for screening mammogram for malignant neoplasm of breast: Secondary | ICD-10-CM

## 2017-09-26 ENCOUNTER — Encounter: Payer: Self-pay | Admitting: Family Medicine

## 2017-11-18 ENCOUNTER — Ambulatory Visit (INDEPENDENT_AMBULATORY_CARE_PROVIDER_SITE_OTHER): Payer: Medicare Other | Admitting: Family Medicine

## 2017-11-18 ENCOUNTER — Ambulatory Visit (INDEPENDENT_AMBULATORY_CARE_PROVIDER_SITE_OTHER): Payer: Medicare Other

## 2017-11-18 ENCOUNTER — Other Ambulatory Visit: Payer: Self-pay

## 2017-11-18 ENCOUNTER — Encounter: Payer: Self-pay | Admitting: Family Medicine

## 2017-11-18 VITALS — BP 130/77 | HR 67 | Temp 98.6°F | Resp 16 | Ht 66.0 in | Wt 159.0 lb

## 2017-11-18 DIAGNOSIS — M545 Low back pain: Secondary | ICD-10-CM | POA: Diagnosis not present

## 2017-11-18 DIAGNOSIS — R109 Unspecified abdominal pain: Secondary | ICD-10-CM

## 2017-11-18 DIAGNOSIS — I10 Essential (primary) hypertension: Secondary | ICD-10-CM | POA: Diagnosis not present

## 2017-11-18 DIAGNOSIS — N3946 Mixed incontinence: Secondary | ICD-10-CM | POA: Diagnosis not present

## 2017-11-18 LAB — POCT URINALYSIS DIP (MANUAL ENTRY)
BILIRUBIN UA: NEGATIVE
Blood, UA: NEGATIVE
GLUCOSE UA: NEGATIVE mg/dL
Leukocytes, UA: NEGATIVE
NITRITE UA: NEGATIVE
Protein Ur, POC: NEGATIVE mg/dL
Spec Grav, UA: 1.02 (ref 1.010–1.025)
Urobilinogen, UA: 1 E.U./dL
pH, UA: 6 (ref 5.0–8.0)

## 2017-11-18 NOTE — Progress Notes (Signed)
Chief Complaint  Patient presents with  . left side pain    intermittent x months, last time she felt it was 2 years unsure if it was due to weight as she weighed more then.  Hx of bladder tuck and feels like it ? has dropped.   Pt  denies dyuria  but she feels the pain real good when lying down and when standing up    HPI   She reports that she can feel the left side pain in the flank that is intermittent  Aggravated by certain positions when she lays down to sleep  She denies radiating pain She reports that the pain has been there a few She states that she went to her doctor that did her bladder surgery and they gave her an ultrasound and they did not see any pain in the left flank No fevers or chills The pain subsided but now she can feel it sometimes    Past Medical History:  Diagnosis Date  . High cholesterol   . Hypertension   . Pneumonia     Current Outpatient Medications  Medication Sig Dispense Refill  . acetaminophen (TYLENOL) 325 MG tablet Take 650 mg by mouth every 6 (six) hours as needed for mild pain.    Marland Kitchen allopurinol (ZYLOPRIM) 100 MG tablet Take 1 tablet by mouth daily.  0  . amLODipine (NORVASC) 5 MG tablet Take 5 mg by mouth daily.    Marland Kitchen aspirin 81 MG tablet Take 81 mg by mouth daily.    Marland Kitchen atorvastatin (LIPITOR) 10 MG tablet Take 10 mg by mouth daily.    . Calcium Carbonate-Vitamin D (CALCIUM 600 + D PO) Take 1 tablet by mouth daily.    . dorzolamide (TRUSOPT) 2 % ophthalmic solution Place 1 drop into the left eye 2 (two) times daily.     . Fluocinonide 0.1 % CREA Apply topically.    . furosemide (LASIX) 20 MG tablet Take 10 mg by mouth daily.     . hydroxypropyl methylcellulose / hypromellose (ISOPTO TEARS / GONIOVISC) 2.5 % ophthalmic solution Place 1 drop into both eyes 3 (three) times daily as needed for dry eyes.    . meclizine (ANTIVERT) 12.5 MG tablet Take 1 tablet (12.5 mg total) by mouth 2 (two) times daily as needed for dizziness. 30 tablet 0  .  Omega-3 Fatty Acids (FISH OIL) 1000 MG CAPS Take 1,000 mg by mouth daily.     . potassium chloride (K-DUR) 10 MEQ tablet Take 10 mEq by mouth daily.    . predniSONE (DELTASONE) 5 MG tablet Take 5 mg by mouth daily with breakfast.     No current facility-administered medications for this visit.     Allergies: No Known Allergies  Past Surgical History:  Procedure Laterality Date  . BLADDER SURGERY    . catract Left 04/06/2014  . VAGINAL HYSTERECTOMY      Social History   Socioeconomic History  . Marital status: Widowed    Spouse name: Not on file  . Number of children: 4  . Years of education: 52  . Highest education level: Not on file  Occupational History    Employer: RETIRED    Comment: Retired  Scientific laboratory technician  . Financial resource strain: Not on file  . Food insecurity:    Worry: Not on file    Inability: Not on file  . Transportation needs:    Medical: Not on file    Non-medical: Not on file  Tobacco Use  .  Smoking status: Never Smoker  . Smokeless tobacco: Never Used  Substance and Sexual Activity  . Alcohol use: No    Alcohol/week: 0.0 oz  . Drug use: No  . Sexual activity: Not on file  Lifestyle  . Physical activity:    Days per week: Not on file    Minutes per session: Not on file  . Stress: Not on file  Relationships  . Social connections:    Talks on phone: Not on file    Gets together: Not on file    Attends religious service: Not on file    Active member of club or organization: Not on file    Attends meetings of clubs or organizations: Not on file    Relationship status: Not on file  Other Topics Concern  . Not on file  Social History Narrative   Patient is widowed and her grandson stay with her.    Retired.   Education high school.   Right handed.   Caffeine coffee one cup daily.    Family History  Problem Relation Age of Onset  . Heart attack Mother   . Diabetes Mother   . Stroke Father   . High blood pressure Father       ROS Review of Systems See HPI Constitution: No fevers or chills No malaise No diaphoresis Skin: No rash or itching Eyes: no blurry vision, no double vision GU: no dysuria or hematuria, +incontinence Neuro: no dizziness or headaches  all others reviewed and negative   Objective: Vitals:   11/18/17 1445  BP: 130/77  Pulse: 67  Resp: 16  Temp: 98.6 F (37 C)  TempSrc: Oral  SpO2: 97%  Weight: 159 lb (72.1 kg)  Height: 5\' 6"  (1.676 m)    Physical Exam  Constitutional: She is oriented to person, place, and time. She appears well-developed and well-nourished.  HENT:  Head: Normocephalic and atraumatic.  Eyes: Conjunctivae and EOM are normal.  Neck: Normal range of motion. Neck supple.  Cardiovascular: Normal rate, regular rhythm and normal heart sounds.  No murmur heard. Pulmonary/Chest: Effort normal and breath sounds normal. No stridor. No respiratory distress. She has no wheezes.  Neurological: She is alert and oriented to person, place, and time.  Skin: Skin is warm. Capillary refill takes less than 2 seconds.  Psychiatric: She has a normal mood and affect. Her behavior is normal. Judgment and thought content normal.   Hip exam: nontender over the SI joint, the trochanter with normal range of motion Back exam: full range of motion, no tenderness, palpable spasm or pain on motion. Straight-leg raise: negative bilaterally Reflexes:       Right leg: 2+ at knees bilaterally      Left leg: 2+ at Achilles bilaterally. Strength: normal and equal bilaterally  Sensory exam: normal in both lower extremities.  Able to toe walk, heel walk without difficulty or obvious weakness. No obvious pain with hip motion or log rolling of leg.   CLINICAL DATA:  Left flank pain  EXAM: DG HIP (WITH OR WITHOUT PELVIS) 2-3V LEFT  COMPARISON:  None.  FINDINGS: No evidence of hip fracture or bone lesion. No notable degenerative spurring or hip narrowing. Osteopenia.  Atherosclerosis.  IMPRESSION: No acute or focal finding.   Electronically Signed   By: Monte Fantasia M.D.   On: 11/18/2017 15:42   Assessment and Plan Lori Wilson was seen today for left side pain.  Diagnoses and all orders for this visit:  Left low back pain, unspecified  chronicity, with sciatica presence unspecified -   No UTI noted -     POCT urinalysis dipstick  Left flank pain- unclear of cause Urology did not think it was bladder or kidney Evaluation of hip did not reveal arthropathy -     DG HIP UNILAT W OR W/O PELVIS 2-3 VIEWS LEFT; Future Advised Aspercreme with lidocaine topical pain relief   Essential hypertension- bp well controlled, cpm  Mixed incontinence- continue to follow with Urology     [place]

## 2017-11-18 NOTE — Patient Instructions (Addendum)
CLINICAL DATA:  Left flank pain  EXAM: DG HIP (WITH OR WITHOUT PELVIS) 2-3V LEFT  COMPARISON:  None.  FINDINGS: No evidence of hip fracture or bone lesion. No notable degenerative spurring or hip narrowing. Osteopenia. Atherosclerosis.  IMPRESSION: No acute or focal finding.   Electronically Signed   By: Monte Fantasia M.D.   On: 11/18/2017 15:42    IF you received an x-ray today, you will receive an invoice from [facility]. Please contact [facility] at [phone] with questions or concerns regarding your invoice.   IF you received labwork today, you will receive an invoice from [facility]. Please contact [facility] at [phone] with questions or concerns regarding your invoice.   Our billing staff will not be able to assist you with questions regarding bills from these companies.  You will be contacted with the lab results as soon as they are available. The fastest way to get your results is to activate your My Chart account. Instructions are located on the last page of this paperwork. If you have not heard from [place] regarding the results in 2 weeks, please contact this office.    Flank Pain Flank pain is pain in your side. The flank is the area of your side between your upper belly (abdomen) and your back. The pain may occur over a short period of time (acute) or may be long-term or come back often (chronic). It may be mild or very bad. Pain in this area can be caused by many different things. Follow these instructions at home:  Rest as told by your doctor.  Drink enough fluid to keep your pee (urine) clear or pale yellow.  Take over-the-counter and prescription medicines only as told by your doctor.  Keep all follow-up visits as told by your doctor. This is important. Contact a doctor if:  Medicine does not help your pain.  You have new symptoms.  Your pain gets worse.  You have a fever.  Your symptoms last longer than 2-3 days. Get  help right away if:  Your tummy hurts or is swollen.  You are short of breath.  You feel sick to your stomach (nauseous) and it does not go away.  You cannot stop throwing up (vomiting).  You feel like you will pass out or you do pass out (faint).  You have blood in your pee.  You have a fever and your symptoms suddenly get worse. This information is not intended to replace advice given to you by your health care provider. Make sure you discuss any questions you have with your health care provider. Document Released: 04/10/2008 Document Revised: 03/23/2016 Document Reviewed: 04/05/2015 Elsevier Interactive Patient Education  2018 [employer].

## 2017-11-28 DIAGNOSIS — M25561 Pain in right knee: Secondary | ICD-10-CM | POA: Diagnosis not present

## 2017-11-28 DIAGNOSIS — M150 Primary generalized (osteo)arthritis: Secondary | ICD-10-CM | POA: Diagnosis not present

## 2017-11-28 DIAGNOSIS — M316 Other giant cell arteritis: Secondary | ICD-10-CM | POA: Diagnosis not present

## 2017-11-28 DIAGNOSIS — M1712 Unilateral primary osteoarthritis, left knee: Secondary | ICD-10-CM | POA: Diagnosis not present

## 2017-11-28 DIAGNOSIS — M1009 Idiopathic gout, multiple sites: Secondary | ICD-10-CM | POA: Diagnosis not present

## 2017-11-28 DIAGNOSIS — Z7952 Long term (current) use of systemic steroids: Secondary | ICD-10-CM | POA: Diagnosis not present

## 2017-11-28 DIAGNOSIS — M1711 Unilateral primary osteoarthritis, right knee: Secondary | ICD-10-CM | POA: Diagnosis not present

## 2017-11-28 DIAGNOSIS — M25562 Pain in left knee: Secondary | ICD-10-CM | POA: Diagnosis not present

## 2017-11-28 DIAGNOSIS — R768 Other specified abnormal immunological findings in serum: Secondary | ICD-10-CM | POA: Diagnosis not present

## 2017-11-28 DIAGNOSIS — M25569 Pain in unspecified knee: Secondary | ICD-10-CM | POA: Diagnosis not present

## 2018-01-08 ENCOUNTER — Telehealth: Payer: Self-pay | Admitting: Family Medicine

## 2018-01-08 NOTE — Telephone Encounter (Signed)
Copied from [place] [id number]. Topic: Quick Communication - See Telephone Encounter >> Jan 08, 2018 11:41 AM Mylinda Latina, NT wrote: CRM for notification. See Telephone encounter for: 01/08/18. Patient called and states she needs a refill of amLODipine (NORVASC) 5 MG tablet  Patient states she is out of her medication.   [facility] [place], [place] AT [place] [phone] (Phone) [phone] (Fax)

## 2018-01-09 DIAGNOSIS — M1009 Idiopathic gout, multiple sites: Secondary | ICD-10-CM | POA: Diagnosis not present

## 2018-01-09 DIAGNOSIS — M150 Primary generalized (osteo)arthritis: Secondary | ICD-10-CM | POA: Diagnosis not present

## 2018-01-09 DIAGNOSIS — R768 Other specified abnormal immunological findings in serum: Secondary | ICD-10-CM | POA: Diagnosis not present

## 2018-01-09 DIAGNOSIS — M316 Other giant cell arteritis: Secondary | ICD-10-CM | POA: Diagnosis not present

## 2018-01-09 NOTE — Telephone Encounter (Signed)
Refill req for Amlodipine sent to Dr. Nolon Rod Original refills by historical provider.

## 2018-01-09 NOTE — Telephone Encounter (Signed)
Pt requesting refill of Amlodipine previously filled by historical provider.   LOV: 11/18/17 Dr. Nolon Rod  [facility] -[address]

## 2018-01-10 MED ORDER — AMLODIPINE BESYLATE 5 MG PO TABS: 5.0000 mg | ORAL_TABLET | Freq: Every day | ORAL | 1 refills | Status: DC

## 2018-01-10 NOTE — Telephone Encounter (Signed)
Patient was informed.

## 2018-01-10 NOTE — Telephone Encounter (Signed)
Please notify the patient that the amlodipine was refilled.

## 2018-01-15 DIAGNOSIS — H35363 Drusen (degenerative) of macula, bilateral: Secondary | ICD-10-CM | POA: Diagnosis not present

## 2018-01-15 DIAGNOSIS — H402223 Chronic angle-closure glaucoma, left eye, severe stage: Secondary | ICD-10-CM | POA: Diagnosis not present

## 2018-01-15 DIAGNOSIS — H402211 Chronic angle-closure glaucoma, right eye, mild stage: Secondary | ICD-10-CM | POA: Diagnosis not present

## 2018-01-15 DIAGNOSIS — H35033 Hypertensive retinopathy, bilateral: Secondary | ICD-10-CM | POA: Diagnosis not present

## 2018-05-21 ENCOUNTER — Other Ambulatory Visit: Payer: Self-pay | Admitting: Family Medicine

## 2018-05-21 NOTE — Telephone Encounter (Signed)
Requested medication (s) are due for refill today: Yes  Requested medication (s) are on the active medication list: Yes  Last refill:  Unsure  Future visit scheduled: Yes  Notes to clinic:  Historical provider on medication list.    Requested Prescriptions  Pending Prescriptions Disp Refills   potassium chloride (K-DUR) 10 MEQ tablet 90 tablet 0    Sig: Take 1 tablet (10 mEq total) by mouth daily.     Endocrinology:  Minerals - Potassium Supplementation Failed - 05/21/2018  1:46 PM      Failed - K in normal range and within 360 days    Potassium  Date Value Ref Range Status  08/13/2016 3.5 3.5 - 5.1 mmol/L Final         Failed - Cr in normal range and within 360 days    Creatinine, Ser  Date Value Ref Range Status  08/13/2016 0.87 0.44 - 1.00 mg/dL Final         Passed - Valid encounter within last 12 months    Recent Outpatient Visits          6 months ago Left low back pain, unspecified chronicity, with sciatica presence unspecified   Primary Care at [facility], Zoe A, MD   9 months ago Giant cell arteritis [facility])   Primary Care at [place], MD   3 years ago Right knee pain   Primary Care at [facility], MD      Future Appointments            In 1 month Forrest Moron, MD Primary Care at [place], [facility]

## 2018-05-21 NOTE — Telephone Encounter (Signed)
Copied from [place] [id number]. Topic: Quick Communication - Rx Refill/Question >> May 21, 2018  1:39 PM Selinda Flavin B, NT wrote: Medication: potassium chloride (K-DUR) 10 MEQ tablet  Has the patient contacted their pharmacy? Yes.   (Agent: If no, request that the patient contact the pharmacy for the refill.) (Agent: If yes, when and what did the pharmacy advise?)  Preferred Pharmacy (with phone number or street name): [facility] [place], [place]: Please be advised that RX refills may take up to 3 business days. We ask that you follow-up with your pharmacy.

## 2018-05-25 NOTE — Telephone Encounter (Signed)
Refill req for potassium sent to Dr. Nolon Rod Prescribed by another provider.

## 2018-06-04 ENCOUNTER — Other Ambulatory Visit: Payer: Self-pay | Admitting: Family Medicine

## 2018-06-04 MED ORDER — ALLOPURINOL 100 MG PO TABS: 100.0000 mg | ORAL_TABLET | Freq: Every day | ORAL | 0 refills | Status: DC

## 2018-06-04 MED ORDER — POTASSIUM CHLORIDE ER 10 MEQ PO TBCR: 10.0000 meq | EXTENDED_RELEASE_TABLET | Freq: Every day | ORAL | 0 refills | Status: DC

## 2018-06-04 NOTE — Telephone Encounter (Signed)
Copied from [place] [id number]. Topic: General - Other >> Jun 04, 2018  9:45 AM Yvette Rack wrote: Reason for CRM: Pt called in to check the status of the refill request for potassium chloride (K-DUR) 10 MEQ tablet.

## 2018-06-04 NOTE — Telephone Encounter (Signed)
Requested medication (s) are due for refill today: Yes  Requested medication (s) are on the active medication list: Yes  Last refill: Allopurinol 08/23/14; Potassium unknown date both by historical provider  Future visit scheduled: Yes  Notes to clinic:  Unable to refill per protocol, patient's 2nd request     Requested Prescriptions  Pending Prescriptions Disp Refills   allopurinol (ZYLOPRIM) 100 MG tablet  0    Sig: Take 1 tablet (100 mg total) by mouth daily.     Endocrinology:  Gout Agents Failed - 06/04/2018  5:24 PM      Failed - Uric Acid in normal range and within 360 days    No results found for: POCURA, LABURIC       Failed - Cr in normal range and within 360 days    Creatinine, Ser  Date Value Ref Range Status  08/13/2016 0.87 0.44 - 1.00 mg/dL Final         Passed - Valid encounter within last 12 months    Recent Outpatient Visits          6 months ago Left low back pain, unspecified chronicity, with sciatica presence unspecified   Primary Care at [facility], Zoe A, MD   9 months ago Giant cell arteritis [facility])   Primary Care at [facility], Arlie Solomons, MD   3 years ago Right knee pain   [facility] at [facility], Synetta Shadow, MD      Future Appointments            In 3 weeks Forrest Moron, MD Primary Care at [place], [facility]          potassium chloride (K-DUR) 10 MEQ tablet      Sig: Take 1 tablet (10 mEq total) by mouth daily.     Endocrinology:  Minerals - Potassium Supplementation Failed - 06/04/2018  5:24 PM      Failed - K in normal range and within 360 days    Potassium  Date Value Ref Range Status  08/13/2016 3.5 3.5 - 5.1 mmol/L Final         Failed - Cr in normal range and within 360 days    Creatinine, Ser  Date Value Ref Range Status  08/13/2016 0.87 0.44 - 1.00 mg/dL Final         Passed - Valid encounter within last 12 months    Recent Outpatient Visits          6 months ago Left low back pain, unspecified chronicity, with  sciatica presence unspecified   Primary Care at [facility], Zoe A, MD   9 months ago Giant cell arteritis [facility])   Primary Care at [place], MD   3 years ago Right knee pain   Primary Care at [facility]ales, MD      Future Appointments            In 3 weeks Forrest Moron, MD Primary Care at [place], [facility]

## 2018-06-04 NOTE — Telephone Encounter (Unsigned)
Copied from [place] [id number]. Topic: Quick Communication - Rx Refill/Question >> Jun 04, 2018  9:43 AM Yvette Rack wrote: Medication: allopurinol (ZYLOPRIM) 100 MG tablet  Has the patient contacted their pharmacy? no  Preferred Pharmacy (with phone number or street name): [facility] [phone] - [place], [place] - [address] AT [place] [phone] (Phone) [phone] (Fax)  Agent: Please be advised that RX refills may take up to 3 business days. We ask that you follow-up with your pharmacy.

## 2018-06-24 DIAGNOSIS — H169 Unspecified keratitis: Secondary | ICD-10-CM | POA: Diagnosis not present

## 2018-06-24 DIAGNOSIS — H21263 Iris atrophy (essential) (progressive), bilateral: Secondary | ICD-10-CM | POA: Diagnosis not present

## 2018-06-24 DIAGNOSIS — H402211 Chronic angle-closure glaucoma, right eye, mild stage: Secondary | ICD-10-CM | POA: Diagnosis not present

## 2018-06-24 DIAGNOSIS — H402223 Chronic angle-closure glaucoma, left eye, severe stage: Secondary | ICD-10-CM | POA: Diagnosis not present

## 2018-06-26 ENCOUNTER — Ambulatory Visit: Payer: Medicare Other | Admitting: Family Medicine

## 2018-07-02 ENCOUNTER — Other Ambulatory Visit: Payer: Self-pay | Admitting: Family Medicine

## 2018-07-02 ENCOUNTER — Ambulatory Visit (INDEPENDENT_AMBULATORY_CARE_PROVIDER_SITE_OTHER): Payer: Medicare Other | Admitting: Internal Medicine

## 2018-07-02 ENCOUNTER — Encounter: Payer: Self-pay | Admitting: Internal Medicine

## 2018-07-02 VITALS — BP 110/78 | HR 67 | Temp 98.8°F | Ht 66.0 in | Wt 170.6 lb

## 2018-07-02 DIAGNOSIS — R42 Dizziness and giddiness: Secondary | ICD-10-CM | POA: Diagnosis not present

## 2018-07-02 DIAGNOSIS — M316 Other giant cell arteritis: Secondary | ICD-10-CM

## 2018-07-02 DIAGNOSIS — E7800 Pure hypercholesterolemia, unspecified: Secondary | ICD-10-CM

## 2018-07-02 DIAGNOSIS — I10 Essential (primary) hypertension: Secondary | ICD-10-CM

## 2018-07-02 MED ORDER — ATORVASTATIN CALCIUM 10 MG PO TABS: 10.0000 mg | ORAL_TABLET | Freq: Every day | ORAL | 1 refills | Status: DC

## 2018-07-02 MED ORDER — AMLODIPINE BESYLATE 5 MG PO TABS: 5.0000 mg | ORAL_TABLET | Freq: Every day | ORAL | 1 refills | Status: DC

## 2018-07-02 MED ORDER — FUROSEMIDE 20 MG PO TABS: 10.0000 mg | ORAL_TABLET | Freq: Every day | ORAL | 1 refills | Status: DC

## 2018-07-02 MED ORDER — ALLOPURINOL 100 MG PO TABS: 100.0000 mg | ORAL_TABLET | Freq: Every day | ORAL | 1 refills | Status: DC

## 2018-07-02 MED ORDER — MECLIZINE HCL 12.5 MG PO TABS: 12.5000 mg | ORAL_TABLET | Freq: Two times a day (BID) | ORAL | 0 refills | Status: DC | PRN

## 2018-07-02 NOTE — Progress Notes (Signed)
Established Patient Office Visit     CC/Reason for Visit: Establish care, follow-up on chronic medical conditions  HPI: Lori Wilson is a 83 y.o. female who is coming in today for the above mentioned reasons.  She is past due for her annual wellness visit.  Past Medical History is significant for: Giant cell arteritis followed by rheumatologist Dr. Dossie Der, taken off prednisone in the past 4 months or so, history of vertigo for which she takes meclizine as needed, history of gout that has been well controlled on allopurinol, history of hypertension well controlled on Norvasc and hyperlipidemia on a statin.  She needs medication refills today.  Other than this no acute issues.   Past Medical/Surgical History: Past Medical History:  Diagnosis Date  . High cholesterol   . Hypertension   . Pneumonia     Past Surgical History:  Procedure Laterality Date  . BLADDER SURGERY    . catract Left 04/06/2014  . VAGINAL HYSTERECTOMY      Social History:  reports that she has never smoked. She has never used smokeless tobacco. She reports that she does not drink alcohol or use drugs.  Allergies: No Known Allergies  Family History:  Family History  Problem Relation Age of Onset  . Heart attack Mother   . Diabetes Mother   . Stroke Father   . High blood pressure Father      Current Outpatient Medications:  .  acetaminophen (TYLENOL) 325 MG tablet, Take 650 mg by mouth every 6 (six) hours as needed for mild pain., Disp: , Rfl:  .  allopurinol (ZYLOPRIM) 100 MG tablet, Take 1 tablet (100 mg total) by mouth daily., Disp: 30 tablet, Rfl: 0 .  amLODipine (NORVASC) 5 MG tablet, Take 1 tablet (5 mg total) by mouth daily., Disp: 90 tablet, Rfl: 1 .  aspirin 81 MG tablet, Take 81 mg by mouth daily., Disp: , Rfl:  .  atorvastatin (LIPITOR) 10 MG tablet, Take 10 mg by mouth daily., Disp: , Rfl:  .  Calcium Carbonate-Vitamin D (CALCIUM 600 + D PO), Take 1 tablet by mouth daily., Disp: ,  Rfl:  .  dorzolamide (TRUSOPT) 2 % ophthalmic solution, Place 1 drop into the left eye 2 (two) times daily. , Disp: , Rfl:  .  Fluocinonide 0.1 % CREA, Apply topically., Disp: , Rfl:  .  furosemide (LASIX) 20 MG tablet, Take 10 mg by mouth daily. , Disp: , Rfl:  .  hydroxypropyl methylcellulose / hypromellose (ISOPTO TEARS / GONIOVISC) 2.5 % ophthalmic solution, Place 1 drop into both eyes 3 (three) times daily as needed for dry eyes., Disp: , Rfl:  .  meclizine (ANTIVERT) 12.5 MG tablet, Take 1 tablet (12.5 mg total) by mouth 2 (two) times daily as needed for dizziness., Disp: 30 tablet, Rfl: 0  Review of Systems:  Constitutional: Denies fever, chills, diaphoresis, appetite change and fatigue.  HEENT: Denies photophobia, eye pain, redness, hearing loss, ear pain, congestion, sore throat, rhinorrhea, sneezing, mouth sores, trouble swallowing, neck pain, neck stiffness and tinnitus.   Respiratory: Denies SOB, DOE, cough, chest tightness,  and wheezing.   Cardiovascular: Denies chest pain, palpitations and leg swelling.  Gastrointestinal: Denies nausea, vomiting, abdominal pain, diarrhea, constipation, blood in stool and abdominal distention.  Genitourinary: Denies dysuria, urgency, frequency, hematuria, flank pain and difficulty urinating.  Endocrine: Denies: hot or cold intolerance, sweats, changes in hair or nails, polyuria, polydipsia. Musculoskeletal: Denies myalgias, back pain, joint swelling, arthralgias and gait problem.  Skin: Denies pallor, rash and wound.  Neurological: Denies dizziness, seizures, syncope, weakness, light-headedness, numbness and headaches.  Hematological: Denies adenopathy. Easy bruising, personal or family bleeding history  Psychiatric/Behavioral: Denies suicidal ideation, mood changes, confusion, nervousness, sleep disturbance and agitation    Physical Exam: Vitals:   07/02/18 1405  BP: 110/78  Pulse: 67  Temp: 98.8 F (37.1 C)  TempSrc: Oral  SpO2: 95%    Weight: 170 lb 9.6 oz (77.4 kg)  Height: 5\' 6"  (1.676 m)    Body mass index is 27.54 kg/m.   Constitutional: NAD, calm, comfortable Eyes: PERRL, lids and conjunctivae normal ENMT: Mucous membranes are moist.  Neck: normal, supple, no masses, no thyromegaly Respiratory: clear to auscultation bilaterally, no wheezing, no crackles. Normal respiratory effort. No accessory muscle use.  Cardiovascular: Regular rate and rhythm, no murmurs / rubs / gallops. No extremity edema. 2+ pedal pulses. No carotid bruits.  Abdomen: no tenderness, no masses palpated. No hepatosplenomegaly. Bowel sounds positive.  Musculoskeletal: no clubbing / cyanosis. No joint deformity upper and lower extremities. Good ROM, no contractures. Normal muscle tone.  Skin: no rashes, lesions, ulcers. No induration Neurologic: Grossly intact nonfocal Psychiatric: Normal judgment and insight. Alert and oriented x 3. Normal mood.    Impression and Plan:  Essential hypertension -Well-controlled on Norvasc, will refill today. -When she returns in 3 months for her annual wellness visit, will check basic metabolic profile to follow renal function.  High cholesterol -Last LDL was 122 in January 2018.  She is on Lipitor 10 mg. -We will have repeat lipids at next visit during annual wellness lab work.  Giant cell arteritis (HCC) -No vision disturbances, seen by rheumatology over the past 4 months, her prednisone was discontinued at that time. -She has repeat follow-up with rheumatology in January.  Vertigo -Occasional episodes.  Takes meclizine as needed.    Patient Instructions  -It was nice meeting you today!  -Schedule follow up in 4 months for your annual wellness visit. Please come fasting at that time.      Lelon Frohlich, MD [facility] [facility]

## 2018-07-02 NOTE — Patient Instructions (Addendum)
-  It was nice meeting you today!  -Schedule follow up in 4 months for your annual wellness visit. Please come fasting at that time.

## 2018-07-02 NOTE — Telephone Encounter (Signed)
Requested medication (s) are due for refill today: yes  Requested medication (s) are on the active medication list: ys  Last refill:  06/04/18  Future visit scheduled: today  Notes to clinic:  Last potassium in 2018   Requested Prescriptions  Pending Prescriptions Disp Refills   potassium chloride (K-DUR) 10 MEQ tablet [Pharmacy Med Name: POTASSIUM CL 10MEQ CR TABLETS] 30 tablet 0    Sig: TAKE 1 TABLET(10 MEQ) BY MOUTH DAILY     Endocrinology:  Minerals - Potassium Supplementation Failed - 07/02/2018  3:07 AM      Failed - K in normal range and within 360 days    Potassium  Date Value Ref Range Status  08/13/2016 3.5 3.5 - 5.1 mmol/L Final         Failed - Cr in normal range and within 360 days    Creatinine, Ser  Date Value Ref Range Status  08/13/2016 0.87 0.44 - 1.00 mg/dL Final         Passed - Valid encounter within last 12 months    Recent Outpatient Visits          7 months ago Left low back pain, unspecified chronicity, with sciatica presence unspecified   Primary Care at [facility], [place], MD   10 months ago Giant cell arteritis [facility])   Primary Care at [facility], Arlie Solomons, MD   4 years ago Right knee pain   Primary Care at [facility], MD      Future Appointments            Today Isaac Bliss, Rayford Halsted, MD [place] at [place], [facility]

## 2018-07-03 ENCOUNTER — Telehealth: Payer: Self-pay | Admitting: *Deleted

## 2018-07-03 NOTE — Telephone Encounter (Signed)
Copied from [place] #[id number]. Topic: General - Other >> Jul 03, 2018  9:42 AM Yvette Rack wrote: Reason for CRM: Patient stated [employer] told her that Dr. Peyton Najjar office would need to verify that she is a patient. Patient requests that [employer] be contacted at [phone] with her subscriber# [id number] to verify that she is now a patient of Dr. Deniece Ree.

## 2018-07-03 NOTE — Telephone Encounter (Signed)
Please advise 

## 2018-07-07 DIAGNOSIS — L309 Dermatitis, unspecified: Secondary | ICD-10-CM | POA: Diagnosis not present

## 2018-07-07 DIAGNOSIS — L2081 Atopic neurodermatitis: Secondary | ICD-10-CM | POA: Diagnosis not present

## 2018-07-21 NOTE — Telephone Encounter (Signed)
Credentialing is handling.

## 2018-08-03 ENCOUNTER — Other Ambulatory Visit: Payer: Self-pay | Admitting: Family Medicine

## 2018-08-04 NOTE — Telephone Encounter (Signed)
Requested medication (s) are due for refill today: yes  Requested medication (s) are on the active medication list: yes  Last refill:  07/04/18 #30 by Dr. Nolon Rod  Future visit scheduled: No  Notes to clinic:  [place] on 07/02/18 with Dr. Isaac Bliss. Medication last filled by Dr. Nolon Rod. Unable to refill per protocol.     Requested Prescriptions  Pending Prescriptions Disp Refills   potassium chloride (K-DUR) 10 MEQ tablet [Pharmacy Med Name: POTASSIUM CL 10MEQ ER TABLETS] 30 tablet 0    Sig: TAKE 1 TABLET(10 MEQ) BY MOUTH DAILY     Endocrinology:  Minerals - Potassium Supplementation Failed - 08/03/2018  3:07 AM      Failed - K in normal range and within 360 days    Potassium  Date Value Ref Range Status  08/13/2016 3.5 3.5 - 5.1 mmol/L Final         Failed - Cr in normal range and within 360 days    Creatinine, Ser  Date Value Ref Range Status  08/13/2016 0.87 0.44 - 1.00 mg/dL Final         Passed - Valid encounter within last 12 months    Recent Outpatient Visits          1 month ago Essential hypertension   [facility] at [employer], Rayford Halsted, MD   8 months ago Left low back pain, unspecified chronicity, with sciatica presence unspecified   Primary Care at [facility], Arlie Solomons, MD   11 months ago Giant cell arteritis [facility])   Primary Care at [facility], Arlie Solomons, MD   4 years ago Right knee pain   Primary Care at [facility], [facility]

## 2018-08-14 ENCOUNTER — Telehealth: Payer: Self-pay | Admitting: Internal Medicine

## 2018-08-14 MED ORDER — POTASSIUM CHLORIDE ER 10 MEQ PO TBCR: EXTENDED_RELEASE_TABLET | ORAL | 1 refills | Status: DC

## 2018-08-14 NOTE — Telephone Encounter (Signed)
Copied from [place] [id number]. Topic: Quick Communication - Rx Refill/Question >> Aug 14, 2018 12:56 PM Scherrie Gerlach wrote: Medication: potassium chloride (K-DUR) 10 MEQ tablet  This was filled by previous md. Dr Jerilee Hoh has never filled  potassium chloride (K-DUR) 10 MEQ tablet [facility] [phone] - [place], [address] AT [address] [phone] (Phone) [phone] (Fax)

## 2018-08-14 NOTE — Telephone Encounter (Signed)
Yes

## 2018-08-14 NOTE — Telephone Encounter (Signed)
Okay to refill? 

## 2018-08-15 ENCOUNTER — Other Ambulatory Visit: Payer: Self-pay | Admitting: Family Medicine

## 2018-08-15 ENCOUNTER — Other Ambulatory Visit: Payer: Self-pay | Admitting: Internal Medicine

## 2018-08-15 DIAGNOSIS — Z1231 Encounter for screening mammogram for malignant neoplasm of breast: Secondary | ICD-10-CM

## 2018-09-19 ENCOUNTER — Ambulatory Visit: Payer: Self-pay

## 2018-09-19 NOTE — Telephone Encounter (Signed)
Patient called in with c/o "palpitations." She says "this has been going on for about 2 weeks, not all the time. It just happens at any given time. I feel my heart beating fast, then I feel tired, flushed and feel like I'm excited. I just stop what I'm doing and a few minutes it passes, about 3-4 minutes, then I just go back to what I was doing." I asked if it's happening now, she denies. I asked about other symptoms, she says "no other symptoms." According to protocol, see PCP within 3 days, appointment scheduled for Tuesday, 09/23/18 at 0730 with Dr. Jerilee Hoh, care advice given, patient verbalized understanding.  Reason for Disposition . [1] Palpitations AND [2] no improvement after using CARE ADVICE  Answer Assessment - Initial Assessment Questions 1. DESCRIPTION: "Please describe your heart rate or heart beat that you are having" (e.g., fast/slow, regular/irregular, skipped or extra beats, "palpitations")     Fast, then feel flushed, like when exercising 2. ONSET: "When did it start?" (Minutes, hours or days)      About 2 weeks ago 3. DURATION: "How long does it last" (e.g., seconds, minutes, hours)     Last maybe 3-4 minutes 4. PATTERN "Does it come and go, or has it been constant since it started?"  "Does it get worse with exertion?"   "Are you feeling it now?"     Comes and goes anytime; not feeling it now 5. TAP: "Using your hand, can you tap out what you are feeling on a chair or table in front of you, so that I can hear?" (Note: not all patients can do this)       N/A 6. HEART RATE: "Can you tell me your heart rate?" "How many beats in 15 seconds?"  (Note: not all patients can do this)       N/A 7. RECURRENT SYMPTOM: "Have you ever had this before?" If so, ask: "When was the last time?" and "What happened that time?"      No 8. CAUSE: "What do you think is causing the palpitations?"     I don't know 9. CARDIAC HISTORY: "Do you have any history of heart disease?" (e.g., heart attack,  angina, bypass surgery, angioplasty, arrhythmia)      No 10. OTHER SYMPTOMS: "Do you have any other symptoms?" (e.g., dizziness, chest pain, sweating, difficulty breathing)       No, just feels tired during the episode, then fine after 11. PREGNANCY: "Is there any chance you are pregnant?" "When was your last menstrual period?"       No  Protocols used: HEART RATE AND HEARTBEAT QUESTIONS-A-AH

## 2018-09-23 ENCOUNTER — Ambulatory Visit: Payer: Medicare Other | Admitting: Internal Medicine

## 2018-09-23 ENCOUNTER — Ambulatory Visit (INDEPENDENT_AMBULATORY_CARE_PROVIDER_SITE_OTHER): Payer: Medicare Other | Admitting: Internal Medicine

## 2018-09-23 ENCOUNTER — Encounter: Payer: Self-pay | Admitting: Internal Medicine

## 2018-09-23 VITALS — BP 110/72 | HR 72 | Temp 98.3°F | Ht 66.0 in | Wt 170.5 lb

## 2018-09-23 DIAGNOSIS — R002 Palpitations: Secondary | ICD-10-CM

## 2018-09-23 DIAGNOSIS — I10 Essential (primary) hypertension: Secondary | ICD-10-CM

## 2018-09-23 DIAGNOSIS — I499 Cardiac arrhythmia, unspecified: Secondary | ICD-10-CM | POA: Diagnosis not present

## 2018-09-23 NOTE — Patient Instructions (Addendum)
Good seeing you this afternoon.  Instructions: -Referral to cardiology for a heart monitor to wear and monitor your heart rate -Follow up if symptoms worsen or as needed   Palpitations Palpitations are feelings that your heartbeat is not normal. Your heartbeat may feel like it is:  Uneven.  Faster than normal.  Fluttering.  Skipping a beat. This is usually not a serious problem. In some cases, you may need tests to rule out any serious problems. Follow these instructions at home: Pay attention to any changes in your condition. Take these actions to help manage your symptoms: Eating and drinking  Avoid: ? Coffee, tea, soft drinks, and energy drinks. ? Chocolate. ? Alcohol. ? Diet pills. Lifestyle   Try to lower your stress. These things can help you relax: ? Yoga. ? Deep breathing and meditation. ? Exercise. ? Using words and images to create positive thoughts (guided imagery). ? Using your mind to control things in your body (biofeedback).  Do not use drugs.  Get plenty of rest and sleep. Keep a regular bed time. General instructions   Take over-the-counter and prescription medicines only as told by your doctor.  Do not use any products that contain nicotine or tobacco, such as cigarettes and e-cigarettes. If you need help quitting, ask your doctor.  Keep all follow-up visits as told by your doctor. This is important. You may need more tests if palpitations do not go away or get worse. Contact a doctor if:  Your symptoms last more than 24 hours.  Your symptoms occur more often. Get help right away if you:  Have chest pain.  Feel short of breath.  Have a very bad headache.  Feel dizzy.  Pass out (faint). Summary  Palpitations are feelings that your heartbeat is uneven or faster than normal. It may feel like your heart is fluttering or skipping a beat.  Avoid food and drinks that may cause palpitations. These include caffeine, chocolate, and  alcohol.  Try to lower your stress. Do not smoke or use drugs.  Get help right away if you faint or have chest pain, shortness of breath, a severe headache, or dizziness. This information is not intended to replace advice given to you by your health care provider. Make sure you discuss any questions you have with your health care provider. Document Released: 04/10/2008 Document Revised: 08/14/2017 Document Reviewed: 08/14/2017 Elsevier Interactive Patient Education  2019 [employer].

## 2018-09-23 NOTE — Progress Notes (Signed)
Established Patient Office Visit     CC/Reason for Visit: irregular heart beat  HPI: Lori Wilson is a 84 y.o. female who is coming in today for the above mentioned reasons. Past Medical History is significant for hypertension, hyperlipidemia, gout, vertigo, GCA and pneumonia.  Patient c/o palpitations that started off and on almost two weeks ago.  She states is feels like "her heart is beating in her neck". She has that feeling most days and even felt it this morning.  She has to stop what she is doing and just rest.  Patient says she can be sitting and she can feel her HB in her upper chest.  Denies dizziness or lightheadedness or SOB or chest pain/pressure.  Patient has hx of giant cell arteritis and saw her doctor in December and is no longer taking prednisone.    She drinks one cup of coffee a day.  No sodas or tea.   Past Medical/Surgical History: Past Medical History:  Diagnosis Date  . High cholesterol   . Hypertension   . Pneumonia     Past Surgical History:  Procedure Laterality Date  . BLADDER SURGERY    . catract Left 04/06/2014  . VAGINAL HYSTERECTOMY      Social History:  reports that she has never smoked. She has never used smokeless tobacco. She reports that she does not drink alcohol or use drugs.  Allergies: No Known Allergies  Family History:  Family History  Problem Relation Age of Onset  . Heart attack Mother   . Diabetes Mother   . Stroke Father   . High blood pressure Father      Current Outpatient Medications:  .  acetaminophen (TYLENOL) 325 MG tablet, Take 650 mg by mouth every 6 (six) hours as needed for mild pain., Disp: , Rfl:  .  allopurinol (ZYLOPRIM) 100 MG tablet, Take 1 tablet (100 mg total) by mouth daily., Disp: 90 tablet, Rfl: 1 .  amLODipine (NORVASC) 5 MG tablet, Take 1 tablet (5 mg total) by mouth daily., Disp: 90 tablet, Rfl: 1 .  aspirin 81 MG tablet, Take 81 mg by mouth daily., Disp: , Rfl:  .  dorzolamide (TRUSOPT) 2  % ophthalmic solution, Place 1 drop into the left eye 2 (two) times daily. , Disp: , Rfl:  .  furosemide (LASIX) 20 MG tablet, Take 0.5 tablets (10 mg total) by mouth daily., Disp: 45 tablet, Rfl: 1 .  hydroxypropyl methylcellulose / hypromellose (ISOPTO TEARS / GONIOVISC) 2.5 % ophthalmic solution, Place 1 drop into both eyes 3 (three) times daily as needed for dry eyes., Disp: , Rfl:  .  meclizine (ANTIVERT) 12.5 MG tablet, Take 1 tablet (12.5 mg total) by mouth 2 (two) times daily as needed for dizziness., Disp: 60 tablet, Rfl: 0 .  potassium chloride (K-DUR) 10 MEQ tablet, TAKE 1 TABLET(10 MEQ) BY MOUTH DAILY, Disp: 90 tablet, Rfl: 1 .  atorvastatin (LIPITOR) 10 MG tablet, Take 1 tablet (10 mg total) by mouth daily. (Patient not taking: Reported on 09/23/2018), Disp: 90 tablet, Rfl: 1  Review of Systems:  Constitutional: Denies fever, chills, diaphoresis, appetite change and fatigue.  HEENT: Denies photophobia, eye pain, redness, hearing loss, ear pain, congestion, sore throat, rhinorrhea, sneezing, mouth sores, trouble swallowing, neck pain, neck stiffness and tinnitus.   Respiratory: Denies SOB, DOE, cough, chest tightness,  and wheezing.   Cardiovascular: Denies chest pain,  and leg swelling. C/o palpitations that come and go x2 weeks Gastrointestinal: Denies  nausea, vomiting, abdominal pain, diarrhea, constipation, blood in stool and abdominal distention.  Genitourinary: Denies dysuria, urgency, frequency, hematuria, flank pain and difficulty urinating.  Endocrine: Denies: hot or cold intolerance, sweats, changes in hair or nails, polyuria, polydipsia. Musculoskeletal: Denies myalgias, back pain, joint swelling, arthralgias and gait problem.  Skin: Denies pallor, rash and wound.  Neurological: Denies dizziness, seizures, syncope, weakness, light-headedness, numbness and headaches.  Hematological: Denies adenopathy. Easy bruising, personal or family bleeding history  Psychiatric/Behavioral:  Denies suicidal ideation, mood changes, confusion, nervousness, sleep disturbance and agitation  Physical Exam: Vitals:   09/23/18 1550  BP: 110/72  Pulse: 72  Temp: 98.3 F (36.8 C)  TempSrc: Oral  SpO2: 95%  Weight: 170 lb 8 oz (77.3 kg)  Height: 5\' 6"  (1.676 m)    Body mass index is 27.52 kg/m.   Constitutional: NAD, calm, comfortable Eyes: PERRL, lids and conjunctivae normal Neck: supple, no thyromegaly, no JVD Respiratory: clear to auscultation bilaterally, no wheezing, no crackles. Normal respiratory effort. No accessory muscle use.  Cardiovascular: Regular rate and rhythm, no murmurs / rubs / gallops. No extremity edema. 2+ pedal pulses.  Abd: S/NT/ND/+BS Neuro: grossly intact and non-focal Psychiatric: Normal judgment and insight. Alert and oriented x 3. Normal mood.    Impression and Plan:  Palpitations  -EKG done in office today and interpreted by myself: NSR, rate 74, normal axis, low voltage, no ST-T changes. -Since these palpitations happen on a daily basis and interfere with her activities, will send to cardiology for Holter monitor placement. -Minimal caffeine intake: 1 cup in am.  Essential hypertension -well controlled     Patient Instructions  Good seeing you this afternoon.  Instructions: -Referral to cardiology for a heart monitor to wear and monitor your heart rate -Follow up if symptoms worsen or as needed   Palpitations Palpitations are feelings that your heartbeat is not normal. Your heartbeat may feel like it is:  Uneven.  Faster than normal.  Fluttering.  Skipping a beat. This is usually not a serious problem. In some cases, you may need tests to rule out any serious problems. Follow these instructions at home: Pay attention to any changes in your condition. Take these actions to help manage your symptoms: Eating and drinking  Avoid: ? Coffee, tea, soft drinks, and energy drinks. ? Chocolate. ? Alcohol. ? Diet  pills. Lifestyle   Try to lower your stress. These things can help you relax: ? Yoga. ? Deep breathing and meditation. ? Exercise. ? Using words and images to create positive thoughts (guided imagery). ? Using your mind to control things in your body (biofeedback).  Do not use drugs.  Get plenty of rest and sleep. Keep a regular bed time. General instructions   Take over-the-counter and prescription medicines only as told by your doctor.  Do not use any products that contain nicotine or tobacco, such as cigarettes and e-cigarettes. If you need help quitting, ask your doctor.  Keep all follow-up visits as told by your doctor. This is important. You may need more tests if palpitations do not go away or get worse. Contact a doctor if:  Your symptoms last more than 24 hours.  Your symptoms occur more often. Get help right away if you:  Have chest pain.  Feel short of breath.  Have a very bad headache.  Feel dizzy.  Pass out (faint). Summary  Palpitations are feelings that your heartbeat is uneven or faster than normal. It may feel like your heart is fluttering  or skipping a beat.  Avoid food and drinks that may cause palpitations. These include caffeine, chocolate, and alcohol.  Try to lower your stress. Do not smoke or use drugs.  Get help right away if you faint or have chest pain, shortness of breath, a severe headache, or dizziness. This information is not intended to replace advice given to you by your health care provider. Make sure you discuss any questions you have with your health care provider. Document Released: 04/10/2008 Document Revised: 08/14/2017 Document Reviewed: 08/14/2017 Elsevier Interactive Patient Education  2019 [place], RN DNP Student [facility]

## 2018-09-25 ENCOUNTER — Other Ambulatory Visit: Payer: Self-pay

## 2018-09-25 ENCOUNTER — Ambulatory Visit
Admission: RE | Admit: 2018-09-25 | Discharge: 2018-09-25 | Disposition: A | Discharge status: Home / Self Care | Payer: Medicare Other | Source: Ambulatory Visit | Attending: Internal Medicine | Admitting: Internal Medicine

## 2018-09-25 DIAGNOSIS — Z1231 Encounter for screening mammogram for malignant neoplasm of breast: Secondary | ICD-10-CM

## 2018-11-05 ENCOUNTER — Telehealth: Payer: Self-pay

## 2018-11-05 NOTE — Telephone Encounter (Signed)
Left message for patient to call back  

## 2018-11-06 NOTE — Progress Notes (Signed)
Cardiology Consultation:   Patient ID: Lori Wilson MRN: [id number]; DOB: 11-Oct-1934  Admit date: (Not on file) Date of Consult: 11/07/2018  Primary Care Provider: Isaac Bliss, Rayford Halsted, MD Primary Cardiologist: New/Amand Lemoine Primary Electrophysiologist:  None     Patient Profile:   Lori Wilson is a 84 y.o. female with a hx of Vertigo, HTN, HLD who is being seen today for the evaluation of palpitations  at the request of Hester Mates Student-NP and Lelon Frohlich MD .  History of Present Illness:   Lori Wilson 84 y.o. no history of cardiac disease. CRF;s HTN and HLD history of gout , vertigo and pneumonia. Pevious giant cell arteritis now off prednisone Seen by primary 09/23/27 with 2 week complaint of palpitations. Heart beating in her neck Occurs daily Gets fatigue and needs to rest No associated syncope dyspnea or chest pain Pulse was 72 in office with NSR on ECG Echo done 08/12/16 for dizziness was normal with EF 60-65% just mild AR and aortic root 4.2 cm Carotid done for same reason plaque no stenosis in ICA;s   She is widowed over 86 years Raised 4 kids with youngest son living with her Symptoms worse at night resting   Past Medical History:  Diagnosis Date  . Dizziness 08/12/2016  . Giant cell arteritis ([place]) 08/13/2016  . High cholesterol   . Hypertension   . Palpitations 09/23/2018  . Pneumonia   . Tremor 02/24/2014  . Vertigo 08/12/2016    Past Surgical History:  Procedure Laterality Date  . BLADDER SURGERY    . catract Left 04/06/2014  . VAGINAL HYSTERECTOMY       Home Medications:  Prior to Admission medications   Medication Sig Start Date End Date Taking? Authorizing Provider  acetaminophen (TYLENOL) 325 MG tablet Take 650 mg by mouth every 6 (six) hours as needed for mild pain.    [provider]  allopurinol (ZYLOPRIM) 100 MG tablet Take 1 tablet (100 mg total) by mouth daily. 07/02/18   Isaac Bliss, Rayford Halsted, MD  amLODipine  (NORVASC) 5 MG tablet Take 1 tablet (5 mg total) by mouth daily. 07/02/18   Isaac Bliss, Rayford Halsted, MD  aspirin 81 MG tablet Take 81 mg by mouth daily.    [provider]  atorvastatin (LIPITOR) 10 MG tablet Take 1 tablet (10 mg total) by mouth daily. Patient not taking: Reported on 09/23/2018 07/02/18   Isaac Bliss, Rayford Halsted, MD  dorzolamide (TRUSOPT) 2 % ophthalmic solution Place 1 drop into the left eye 2 (two) times daily.     [provider]  furosemide (LASIX) 20 MG tablet Take 0.5 tablets (10 mg total) by mouth daily. 07/02/18   Isaac Bliss, Rayford Halsted, MD  hydroxypropyl methylcellulose / hypromellose (ISOPTO TEARS / GONIOVISC) 2.5 % ophthalmic solution Place 1 drop into both eyes 3 (three) times daily as needed for dry eyes.    [provider]  meclizine (ANTIVERT) 12.5 MG tablet Take 1 tablet (12.5 mg total) by mouth 2 (two) times daily as needed for dizziness. 07/02/18   Isaac Bliss, Rayford Halsted, MD  potassium chloride (K-DUR) 10 MEQ tablet TAKE 1 TABLET(10 MEQ) BY MOUTH DAILY 08/14/18   Isaac Bliss, Rayford Halsted, MD    Inpatient Medications: Scheduled Meds:  Continuous Infusions:  PRN Meds:   Allergies:   No Known Allergies  Social History:   Social History   Socioeconomic History  . Marital status: Widowed    Spouse name: Not on file  .  Number of children: 4  . Years of education: 14  . Highest education level: Not on file  Occupational History    Employer: RETIRED    Comment: Retired  Scientific laboratory technician  . Financial resource strain: Not on file  . Food insecurity:    Worry: Not on file    Inability: Not on file  . Transportation needs:    Medical: Not on file    Non-medical: Not on file  Tobacco Use  . Smoking status: Never Smoker  . Smokeless tobacco: Never Used  Substance and Sexual Activity  . Alcohol use: No    Alcohol/week: 0.0 standard drinks  . Drug use: No  . Sexual activity: Not on file  Lifestyle  . Physical  activity:    Days per week: Not on file    Minutes per session: Not on file  . Stress: Not on file  Relationships  . Social connections:    Talks on phone: Not on file    Gets together: Not on file    Attends religious service: Not on file    Active member of club or organization: Not on file    Attends meetings of clubs or organizations: Not on file    Relationship status: Not on file  . Intimate partner violence:    Fear of current or ex partner: Not on file    Emotionally abused: Not on file    Physically abused: Not on file    Forced sexual activity: Not on file  Other Topics Concern  . Not on file  Social History Narrative   Patient is widowed and her grandson stay with her.    Retired.   Education high school.   Right handed.   Caffeine coffee one cup daily.    Family History:    Family History  Problem Relation Age of Onset  . Heart attack Mother   . Diabetes Mother   . Stroke Father   . High blood pressure Father      ROS:  Please see the history of present illness.   All other ROS reviewed and negative.     Physical Exam/Data:   Vitals:   11/07/18 1007  BP: 130/70  Pulse: 65  SpO2: 98%  Weight: 78.5 kg  Height: 5\' 6"  (1.676 m)   @IOBRIEF @ Last 3 Weights 11/07/2018 09/23/2018 07/02/2018  Weight (lbs) 173 lb 170 lb 8 oz 170 lb 9.6 oz  Weight (kg) 78.472 kg 77.338 kg 77.384 kg     Body mass index is 27.92 kg/m.  General:  Well nourished, well developed, in no acute distress  HEENT: normal Lymph: no adenopathy Neck: no JVD Endocrine:  No thryomegaly Vascular: No carotid bruits; FA pulses 2+ bilaterally without bruits  Cardiac:  normal S1, S2; RRR; no murmur   Lungs:  clear to auscultation bilaterally, no wheezing, rhonchi or rales  Abd: soft, nontender, no hepatomegaly  Ext: no edema Musculoskeletal:  No deformities, BUE and BLE strength normal and equal Skin: warm and dry  Neuro:  CNs 2-12 intact, no focal abnormalities noted Psych:  Normal  affect   EKG:  The EKG was personally reviewed and demonstrates:  SR rate 74 pulmonary disease pattern 09/23/18 ECG:  11/07/18 SR rate 65 PR 236 low voltage no acute ST changes   Relevant CV Studies: Echo 08/12/16 Carotid 08/13/16  Laboratory Data:  ChemistryNo results for input(s): NA, K, CL, CO2, GLUCOSE, BUN, CREATININE, CALCIUM, GFRNONAA, GFRAA, ANIONGAP in the last 168 hours.  No  results for input(s): PROT, ALBUMIN, AST, ALT, ALKPHOS, BILITOT in the last 168 hours. HematologyNo results for input(s): WBC, RBC, HGB, HCT, MCV, MCH, MCHC, RDW, PLT in the last 168 hours. Cardiac EnzymesNo results for input(s): TROPONINI in the last 168 hours. No results for input(s): TROPIPOC in the last 168 hours.  BNPNo results for input(s): BNP, PROBNP in the last 168 hours.  DDimer No results for input(s): DDIMER in the last 168 hours.  Radiology/Studies:  No results found.  Assessment and Plan:   1. Palpitations: etiology not clear Given age and HTN will order 30 day event monitor particularly to r/o PAF 2. HTN: Well controlled.  Continue current medications and low sodium Dash type diet.   3. HLD on statin labs with primary  4. Vertigo has antivert had normal MRI of head 08/12/16 5. Arteritis: f/u rheumatology off prednisone  6. Gout continue allopurinol quiescent   Test ordered:  30 day event monitor F/U PRN if monitor benign    For questions or updates, please contact [facility] Please consult [email or web address] for contact info under     Signed, Jenkins Rouge, MD  11/07/2018 10:32 AM

## 2018-11-06 NOTE — Telephone Encounter (Signed)
Left message for patient to call back regarding appointment with Dr. Johnsie Cancel on 4/24. Call placed due to restrictions enacted for Covid 19.

## 2018-11-07 ENCOUNTER — Ambulatory Visit (INDEPENDENT_AMBULATORY_CARE_PROVIDER_SITE_OTHER): Payer: Medicare Other | Admitting: Cardiovascular Disease

## 2018-11-07 ENCOUNTER — Telehealth: Payer: Self-pay | Admitting: Radiology

## 2018-11-07 ENCOUNTER — Encounter: Payer: Self-pay | Admitting: Cardiovascular Disease

## 2018-11-07 ENCOUNTER — Other Ambulatory Visit: Payer: Self-pay

## 2018-11-07 VITALS — BP 130/70 | HR 65 | Ht 66.0 in | Wt 173.0 lb

## 2018-11-07 DIAGNOSIS — R002 Palpitations: Secondary | ICD-10-CM | POA: Diagnosis not present

## 2018-11-07 NOTE — Telephone Encounter (Signed)
Enrolled patient for a 30 Day Preventcie Event Monitor to be mailed due to covid-19. I went over instructions with the patient and she knows to expect the monitor to arrive in 2-3 days

## 2018-11-07 NOTE — Telephone Encounter (Signed)
Patient called and stated she was coming to her appt, but running late. Patient should be coming in shortly.

## 2018-11-07 NOTE — Patient Instructions (Addendum)
Medication Instructions:   If you need a refill on your cardiac medications before your next appointment, please call your pharmacy.   Lab work:  If you have labs (blood work) drawn today and your tests are completely normal, you will receive your results only by: Marland Kitchen MyChart Message (if you have MyChart) OR . A paper copy in the mail If you have any lab test that is abnormal or we need to change your treatment, we will call you to review the results.  Testing/Procedures: Your physician has recommended that you wear an event monitor. Event monitors are medical devices that record the heart's electrical activity. Doctors most often [place] these monitors to diagnose arrhythmias. Arrhythmias are problems with the speed or rhythm of the heartbeat. The monitor is a small, portable device. You can wear one while you do your normal daily activities. This is usually used to diagnose what is causing palpitations/syncope (passing out).  Follow-Up: At [facility], you and your health needs are our priority.  As part of our continuing mission to provide you with exceptional heart care, we have created designated Provider Care Teams.  These Care Teams include your primary Cardiologist (physician) and Advanced Practice Providers (APPs -  Physician Assistants and Nurse Practitioners) who all work together to provide you with the care you need, when you need it. You will need a follow up appointment in 3  months.  You may see Dr. Johnsie Cancel or one of the following Advanced Practice Providers on your designated Care Team:   Truitt Merle, NP Cecilie Kicks, NP . Kathyrn Drown, NP

## 2018-11-07 NOTE — Telephone Encounter (Signed)
New Message:    Patient calling concering her appt today please call patient back appt is at 9:30 am

## 2018-11-15 ENCOUNTER — Other Ambulatory Visit (INDEPENDENT_AMBULATORY_CARE_PROVIDER_SITE_OTHER): Payer: Medicare Other

## 2018-11-15 DIAGNOSIS — R002 Palpitations: Secondary | ICD-10-CM | POA: Diagnosis not present

## 2018-11-21 ENCOUNTER — Telehealth: Payer: Self-pay | Admitting: Cardiovascular Disease

## 2018-11-21 DIAGNOSIS — I480 Paroxysmal atrial fibrillation: Secondary | ICD-10-CM

## 2018-11-21 NOTE — Telephone Encounter (Signed)
Lori Wilson is calling to give EKG report.

## 2018-11-21 NOTE — Telephone Encounter (Signed)
Verbal report received from [place] with [employer]. New found afib with HR 120.  Fax received.  Reviewed by DOD.  Appears to be afib with rate of 120.   Advised if Pt asymptomatic will continue to monitor and notify ordering physician.  Advised if pt develops symptoms over the weekend -dizziness, syncope, elevated heart rate- Pt should go to ER.  Call placed to Pt.  Pt states she feels fine.  Did not feel anything during the time the monitor picked up afib with HR in the 120's.  Advised Pt of above order.  Pt indicates understanding.  Advised if ordering physician wanted to do anything different for Pt care she would receive a call on Monday.  Otherwise we will continue to monitor her.  Pt thanked nurse for call.

## 2018-11-24 NOTE — Telephone Encounter (Signed)
PaF- new diagnosis Needs echo CBC/PLT/BMET start eliquis 5 bid start lopressor 25mg  bid f/u afib clinic and then me in 3 months

## 2018-11-24 NOTE — Telephone Encounter (Signed)
Left message for patient to call back  

## 2018-11-26 NOTE — Telephone Encounter (Signed)
Left second message for patient to call back.

## 2018-11-27 NOTE — Telephone Encounter (Signed)
F/U Message           Patient is returning Pam's call, she states she wasn't familiar with the # and that's why she didn't answer but she states she will answer next time.

## 2018-11-28 MED ORDER — METOPROLOL TARTRATE 25 MG PO TABS: 25.0000 mg | ORAL_TABLET | Freq: Two times a day (BID) | ORAL | 11 refills | Status: DC

## 2018-11-28 MED ORDER — APIXABAN 5 MG PO TABS: 5.0000 mg | ORAL_TABLET | Freq: Two times a day (BID) | ORAL | 11 refills | Status: DC

## 2018-11-28 NOTE — Telephone Encounter (Signed)
Called patient back with Dr. Kyla Balzarine recommendations. Patient will come in Monday to get lab work done, since she cannot drive and depends on someone else for transportation. Will have scheduling set up echo for patient. Patient already has f/u in July with Dr. Johnsie Cancel will keep that appt. Will send medications to patient's pharmacy of choice. Patient wanted to know if she needs to continue her ASA, will forward to Dr. Johnsie Cancel for advisement.

## 2018-11-29 ENCOUNTER — Telehealth: Payer: Self-pay | Admitting: Cardiology

## 2018-11-29 NOTE — Telephone Encounter (Signed)
[facility] called stating that patient had 3rd degree AV block at 33bpm - patient asymptomatic. This occurred at 10:59am.  I have contacted patient and she is feeling fine.  She says that she thinks her heart monitor is not working. I reveiwed rhythm strips with Dr. Rayann Heman.  There are blocked PACs and well as likely intermittent second degree AV block. Instructed patient not to take the metoprolol that she was prescribed recently (never picked it ups but got a call it was ready at pharmacy) and call Dr. Kyla Balzarine office on Monday for further instructions.  Continue on wear monitor.  SHe was instructed to go to Er if she has any weakness, dizziness, syncope.  She understands.

## 2018-12-01 ENCOUNTER — Other Ambulatory Visit: Payer: Self-pay

## 2018-12-01 ENCOUNTER — Other Ambulatory Visit: Payer: Medicare Other | Admitting: *Deleted

## 2018-12-01 ENCOUNTER — Other Ambulatory Visit: Payer: Self-pay | Admitting: *Deleted

## 2018-12-01 ENCOUNTER — Telehealth: Payer: Self-pay

## 2018-12-01 DIAGNOSIS — I480 Paroxysmal atrial fibrillation: Secondary | ICD-10-CM

## 2018-12-01 DIAGNOSIS — I495 Sick sinus syndrome: Secondary | ICD-10-CM

## 2018-12-01 LAB — BASIC METABOLIC PANEL
BUN/Creatinine Ratio: 20 (ref 12–28)
BUN: 22 mg/dL (ref 8–27)
CO2: 18 mmol/L — ABNORMAL LOW (ref 20–29)
Calcium: 9.4 mg/dL (ref 8.7–10.3)
Chloride: 106 mmol/L (ref 96–106)
Creatinine, Ser: 1.09 mg/dL — ABNORMAL HIGH (ref 0.57–1.00)
GFR calc Af Amer: 55 mL/min/{1.73_m2} — ABNORMAL LOW (ref >59)
GFR calc non Af Amer: 48 mL/min/{1.73_m2} — ABNORMAL LOW (ref >59)
Glucose: 90 mg/dL (ref 65–99)
Potassium: 4.3 mmol/L (ref 3.5–5.2)
Sodium: 136 mmol/L (ref 134–144)

## 2018-12-01 LAB — CBC WITH DIFFERENTIAL/PLATELET
Basophils Absolute: 0 10*3/uL (ref 0.0–0.2)
Basos: 1 %
EOS (ABSOLUTE): 0.4 10*3/uL (ref 0.0–0.4)
Eos: 6 %
Hematocrit: 33.8 % — ABNORMAL LOW (ref 34.0–46.6)
Hemoglobin: 11.3 g/dL (ref 11.1–15.9)
Immature Grans (Abs): 0 10*3/uL (ref 0.0–0.1)
Immature Granulocytes: 0 %
Lymphocytes Absolute: 3 10*3/uL (ref 0.7–3.1)
Lymphs: 50 %
MCH: 29.6 pg (ref 26.6–33.0)
MCHC: 33.4 g/dL (ref 31.5–35.7)
MCV: 89 fL (ref 79–97)
Monocytes Absolute: 0.7 10*3/uL (ref 0.1–0.9)
Monocytes: 11 %
Neutrophils Absolute: 1.9 10*3/uL (ref 1.4–7.0)
Neutrophils: 32 %
Platelets: 236 10*3/uL (ref 150–450)
RBC: 3.82 x10E6/uL (ref 3.77–5.28)
RDW: 12.9 % (ref 11.7–15.4)
WBC: 6 10*3/uL (ref 3.4–10.8)

## 2018-12-01 NOTE — Telephone Encounter (Signed)
Will place referral and send message to EP scheduler.

## 2018-12-01 NOTE — Telephone Encounter (Signed)
-----   Message from Josue Hector, MD sent at 12/01/2018  1:10 PM EDT ----- Appears to have Sick Sinus Syndrome with PAF should have f/u with EP Allred had reviewed tracings

## 2018-12-03 ENCOUNTER — Telehealth: Payer: Self-pay

## 2018-12-03 NOTE — Telephone Encounter (Signed)
Spoke with pt about appt on 12/05/18. Pt stated she is not able to do a video visit and would like to be called over the phone. Pt also stated she will check vitals prior to appt. Pt questions were address.

## 2018-12-05 ENCOUNTER — Telehealth: Payer: Self-pay | Admitting: Internal Medicine

## 2018-12-05 ENCOUNTER — Encounter: Payer: Self-pay | Admitting: Internal Medicine

## 2018-12-05 ENCOUNTER — Telehealth (INDEPENDENT_AMBULATORY_CARE_PROVIDER_SITE_OTHER): Payer: Medicare Other | Admitting: Internal Medicine

## 2018-12-05 ENCOUNTER — Other Ambulatory Visit: Payer: Self-pay

## 2018-12-05 DIAGNOSIS — I441 Atrioventricular block, second degree: Secondary | ICD-10-CM

## 2018-12-05 DIAGNOSIS — R002 Palpitations: Secondary | ICD-10-CM | POA: Diagnosis not present

## 2018-12-05 DIAGNOSIS — I480 Paroxysmal atrial fibrillation: Secondary | ICD-10-CM

## 2018-12-05 DIAGNOSIS — I10 Essential (primary) hypertension: Secondary | ICD-10-CM

## 2018-12-05 MED ORDER — FUROSEMIDE 20 MG PO TABS: 10.0000 mg | ORAL_TABLET | Freq: Every day | ORAL | 1 refills | Status: DC

## 2018-12-05 NOTE — Progress Notes (Signed)
Electrophysiology TeleHealth Note   Due to national recommendations of social distancing due to [place] 19, Audio telehealth visit is felt to be most appropriate for this patient at this time.  Verbal consent was obtained from the patient today.  She states that she does not have access to virtual technology and required a phone call today.   Date:  12/05/2018   ID:  Lori Wilson, DOB 14-Aug-1934, MRN [id number]  Location: home  Provider location: [place] Evaluation Performed: New patient consult  PCP:  Isaac Bliss, Rayford Halsted, MD  Cardiologist:  Dr Johnsie Cancel Electrophysiologist:  None   Chief Complaint:  AV block  History of Present Illness:    Lori Wilson is a 84 y.o. female who presents via audio conferencing for a telehealth visit today.   The patient is referred for new consultation regarding afib and ASV block by Dr Johnsie Cancel. The patient was seen by Dr Johnsie Cancel 11/07/2018.  At that time, she complained of palpitations. His note is reviewed.  Event monitor was placed.   The patient has been observed to have afib for which she was placed on eliquis.  She also was started on metoprolol.  She was later observed 11/29/2018 to have second degree AV block.  I reviewed and discussed with Dr Radford Pax.  The patient was asymptomatic at that time.  Her metoprolol was discontinued. She continues to wear her monitor.  She does not like wearing the monitor but is otherwise without concerns today.  Today, she denies symptoms of chest pain, shortness of breath, orthopnea, PND, lower extremity edema, claudication, dizziness, presyncope, syncope, bleeding, or neurologic sequela. The patient is tolerating medications without difficulties and is otherwise without complaint today.   she denies symptoms of cough, fevers, chills, or new SOB worrisome for COVID 19.   Past Medical History:  Diagnosis Date  . Dizziness 08/12/2016  . Giant cell arteritis ([place]) 08/13/2016  . High cholesterol   .  Hypertension   . Palpitations 09/23/2018  . Pneumonia   . Tremor 02/24/2014  . Vertigo 08/12/2016    Past Surgical History:  Procedure Laterality Date  . BLADDER SURGERY    . catract Left 04/06/2014  . VAGINAL HYSTERECTOMY      Current Outpatient Medications  Medication Sig Dispense Refill  . acetaminophen (TYLENOL) 325 MG tablet Take 650 mg by mouth every 6 (six) hours as needed for mild pain.    Marland Kitchen allopurinol (ZYLOPRIM) 100 MG tablet Take 1 tablet (100 mg total) by mouth daily. 90 tablet 1  . amLODipine (NORVASC) 5 MG tablet Take 1 tablet (5 mg total) by mouth daily. 90 tablet 1  . apixaban (ELIQUIS) 5 MG TABS tablet Take 1 tablet (5 mg total) by mouth 2 (two) times daily. 60 tablet 11  . aspirin 81 MG tablet Take 81 mg by mouth daily.    . dorzolamide (TRUSOPT) 2 % ophthalmic solution Place 1 drop into the left eye 2 (two) times daily.     . furosemide (LASIX) 20 MG tablet Take 0.5 tablets (10 mg total) by mouth daily. 45 tablet 1  . metoprolol tartrate (LOPRESSOR) 25 MG tablet Take 1 tablet (25 mg total) by mouth 2 (two) times daily. 60 tablet 11  . omeprazole (PRILOSEC) 20 MG capsule Take 20 mg by mouth every other day.    . potassium chloride (K-DUR) 10 MEQ tablet TAKE 1 TABLET(10 MEQ) BY MOUTH DAILY 90 tablet 1   No current facility-administered medications for this visit.  Allergies:   Patient has no known allergies.   Social History:  The patient  reports that she has never smoked. She has never used smokeless tobacco. She reports that she does not drink alcohol or use drugs.   Family History:  The patient's family history includes Diabetes in her mother; Heart attack in her mother; High blood pressure in her father; Stroke in her father.    ROS:  Please see the history of present illness.   All other systems are personally reviewed and negative.    Exam:    Vital Signs:  BP 127/80   Pulse 76   Ht 5\' 6"  (1.676 m)   Wt 168 lb (76.2 kg)   BMI 27.12 kg/m   Well  sounding   Labs/Other Tests and Data Reviewed:    Recent Labs: 12/01/2018: BUN 22; Creatinine, Ser 1.09; Hemoglobin 11.3; Platelets 236; Potassium 4.3; Sodium 136   Wt Readings from Last 3 Encounters:  12/05/18 168 lb (76.2 kg)  11/07/18 173 lb (78.5 kg)  09/23/18 170 lb 8 oz (77.3 kg)     Other studies personally reviewed: Additional studies/ records that were reviewed today include: Dr Mariana Arn notes, Dr Landis Gandy phone note,  Recent event monitor strips   Prior ekgs  ASSESSMENT & PLAN:    1. Second degree AV block Mostly asymptomatic Her metoprolol has been discontinued She has degenerative conduction system disease and will likely have progressive AV block.  I have advised pacemaker implantation.  Risks and benefits to PPM were discussed at length with her today.  She is very clear that she does not wish to proceed with PPM at this time.  She wishes for a more conservative course.  Given her advanced age, this is reasonable. She will contact my office if she changes her mind.  2. Paroxysmal afib On eliquis Rates are mostly controlled Avoid AV nodal agents given #1  3. HTN Stable No change required today  4. COVID screen The patient does not have any symptoms that suggest any further testing/ screening at this time.  Social distancing reinforced today.  Follow-up with Dr Johnsie Cancel as scheduled She will contact my office if she decides to consider pacing.  Otherwise I will see as needed going forward  Current medicines are reviewed at length with the patient today.   The patient does not have concerns regarding her medicines.  The following changes were made today:  none  Labs/ tests ordered today include:  No orders of the defined types were placed in this encounter.  Patient Risk:  after full review of this patients clinical status, I feel that they are at moderate risk at this time.  Today, I have spent 22 minutes with the patient with telehealth technology discussing  afib and bradycardia .    Signed, Thompson Grayer MD, [facility] 12/05/2018 2:29 PM   [address] [place] [address] [phone] (office) [phone] (fax)

## 2018-12-05 NOTE — Telephone Encounter (Signed)
Copied from [place] [id number]. Topic: Quick Communication - Rx Refill/Question >> Dec 05, 2018 11:55 AM Margot Ables wrote: Medication: furosemide (LASIX) 20 MG tablet - pt asking if she is supposed to continue taking this medication as she is out and the pharmacy has no refills - they advised her to call  Has the patient contacted their pharmacy? yes Preferred Pharmacy (with phone number or street name): [facility] [phone] - [place], [place] - [address] [phone] (Phone) [phone] (Fax)

## 2018-12-05 NOTE — Telephone Encounter (Signed)
Refill sent.

## 2018-12-25 ENCOUNTER — Other Ambulatory Visit: Payer: Self-pay

## 2018-12-26 ENCOUNTER — Other Ambulatory Visit: Payer: Self-pay | Admitting: Cardiovascular Disease

## 2018-12-26 DIAGNOSIS — R002 Palpitations: Secondary | ICD-10-CM

## 2018-12-30 ENCOUNTER — Other Ambulatory Visit: Payer: Self-pay | Admitting: Internal Medicine

## 2018-12-30 DIAGNOSIS — I10 Essential (primary) hypertension: Secondary | ICD-10-CM

## 2019-01-02 ENCOUNTER — Other Ambulatory Visit: Payer: Self-pay | Admitting: Internal Medicine

## 2019-01-02 DIAGNOSIS — I10 Essential (primary) hypertension: Secondary | ICD-10-CM

## 2019-01-12 ENCOUNTER — Encounter (HOSPITAL_COMMUNITY): Payer: Self-pay | Admitting: Cardiovascular Disease

## 2019-01-12 DIAGNOSIS — M25569 Pain in unspecified knee: Secondary | ICD-10-CM | POA: Diagnosis not present

## 2019-01-12 DIAGNOSIS — M316 Other giant cell arteritis: Secondary | ICD-10-CM | POA: Diagnosis not present

## 2019-01-12 DIAGNOSIS — R768 Other specified abnormal immunological findings in serum: Secondary | ICD-10-CM | POA: Diagnosis not present

## 2019-01-12 DIAGNOSIS — M1009 Idiopathic gout, multiple sites: Secondary | ICD-10-CM | POA: Diagnosis not present

## 2019-01-12 DIAGNOSIS — M150 Primary generalized (osteo)arthritis: Secondary | ICD-10-CM | POA: Diagnosis not present

## 2019-01-23 ENCOUNTER — Ambulatory Visit (HOSPITAL_COMMUNITY): Payer: Medicare Other | Attending: Cardiology

## 2019-01-23 ENCOUNTER — Other Ambulatory Visit: Payer: Self-pay

## 2019-01-23 ENCOUNTER — Encounter (INDEPENDENT_AMBULATORY_CARE_PROVIDER_SITE_OTHER): Payer: Self-pay

## 2019-01-23 DIAGNOSIS — I480 Paroxysmal atrial fibrillation: Secondary | ICD-10-CM | POA: Diagnosis not present

## 2019-01-23 MED ORDER — PERFLUTREN LIPID MICROSPHERE
1.0000 mL | INTRAVENOUS | Status: AC | PRN
  Administered 2019-01-23: 3 mL via INTRAVENOUS

## 2019-02-09 DIAGNOSIS — H402211 Chronic angle-closure glaucoma, right eye, mild stage: Secondary | ICD-10-CM | POA: Diagnosis not present

## 2019-02-09 DIAGNOSIS — H35033 Hypertensive retinopathy, bilateral: Secondary | ICD-10-CM | POA: Diagnosis not present

## 2019-02-09 DIAGNOSIS — H402223 Chronic angle-closure glaucoma, left eye, severe stage: Secondary | ICD-10-CM | POA: Diagnosis not present

## 2019-02-09 DIAGNOSIS — H35363 Drusen (degenerative) of macula, bilateral: Secondary | ICD-10-CM | POA: Diagnosis not present

## 2019-02-09 DIAGNOSIS — H35013 Changes in retinal vascular appearance, bilateral: Secondary | ICD-10-CM | POA: Diagnosis not present

## 2019-02-10 NOTE — Progress Notes (Signed)
Patient ID: Lori Wilson MRN: [id number]; DOB: 11/01/1934  Admit date: (Not on file) Date of Consult: 02/12/2019  Primary Care Provider: Isaac Bliss, Rayford Halsted, MD Primary Cardiologist: New/Jessee Mezera Primary Electrophysiologist:  Allred    History of Present Illness:   Lori Wilson 84 y.o. no history of cardiac disease. First seen 11/07/18  CRF;s HTN and HLD history of gout , vertigo and pneumonia. Pevious giant cell arteritis now off prednisone Seen by primary 09/23/27 with 2 week complaint of palpitations. Heart beating in her neck Occurs daily Gets fatigue and needs to rest No associated syncope dyspnea or chest pain Pulse was 72 in office with NSR on ECG Echo done 08/12/16 for dizziness was normal with EF 60-65% just mild AR and aortic root 4.2 cm Carotid done for same reason plaque no stenosis in ICA;s   She is widowed over 32 years Raised 4 kids with youngest son living with her Symptoms worse at night resting   F/U monitor showed PAF. Started on eliquis and supposed to start metoprolol 25 bid but not clear that she ever picked it up. Subsequently monitor showed 2nd degree AV block with pulses in mid 30's asymptomatic. Seen on televisit by Dr Rayann Heman 5.22/20 recommended pacing and patient declined  Monitor reviewed 12/26/18 with rare PAF rates 150 but frequent episodes of 2:1 AV block Labs 12/01/18 K 4.3 Cr 1.1 Hct 33   Long discussion wit her about need for pacer and the procedure she is willing to proceed   Past Medical History:  Diagnosis Date  . Dizziness 08/12/2016  . Giant cell arteritis ([place]) 08/13/2016  . High cholesterol   . Hypertension   . Palpitations 09/23/2018  . Pneumonia   . Tremor 02/24/2014  . Vertigo 08/12/2016    Past Surgical History:  Procedure Laterality Date  . BLADDER SURGERY    . catract Left 04/06/2014  . VAGINAL HYSTERECTOMY       Home Medications:  Prior to Admission medications   Medication Sig Start Date End Date Taking? Authorizing Provider   acetaminophen (TYLENOL) 325 MG tablet Take 650 mg by mouth every 6 (six) hours as needed for mild pain.    [provider]  allopurinol (ZYLOPRIM) 100 MG tablet Take 1 tablet (100 mg total) by mouth daily. 07/02/18   Isaac Bliss, Rayford Halsted, MD  amLODipine (NORVASC) 5 MG tablet Take 1 tablet (5 mg total) by mouth daily. 07/02/18   Isaac Bliss, Rayford Halsted, MD  aspirin 81 MG tablet Take 81 mg by mouth daily.    [provider]  atorvastatin (LIPITOR) 10 MG tablet Take 1 tablet (10 mg total) by mouth daily. Patient not taking: Reported on 09/23/2018 07/02/18   Isaac Bliss, Rayford Halsted, MD  dorzolamide (TRUSOPT) 2 % ophthalmic solution Place 1 drop into the left eye 2 (two) times daily.     [provider]  furosemide (LASIX) 20 MG tablet Take 0.5 tablets (10 mg total) by mouth daily. 07/02/18   Isaac Bliss, Rayford Halsted, MD  hydroxypropyl methylcellulose / hypromellose (ISOPTO TEARS / GONIOVISC) 2.5 % ophthalmic solution Place 1 drop into both eyes 3 (three) times daily as needed for dry eyes.    [provider]  meclizine (ANTIVERT) 12.5 MG tablet Take 1 tablet (12.5 mg total) by mouth 2 (two) times daily as needed for dizziness. 07/02/18   Isaac Bliss, Rayford Halsted, MD  potassium chloride (K-DUR) 10 MEQ tablet TAKE 1 TABLET(10 MEQ) BY MOUTH DAILY 08/14/18   Isaac Bliss,  Rayford Halsted, MD    Inpatient Medications: Scheduled Meds:  Continuous Infusions:  PRN Meds:   Allergies:   No Known Allergies  Social History:   Social History   Socioeconomic History  . Marital status: Widowed    Spouse name: Not on file  . Number of children: 4  . Years of education: 5  . Highest education level: Not on file  Occupational History    Employer: RETIRED    Comment: Retired  Scientific laboratory technician  . Financial resource strain: Not on file  . Food insecurity    Worry: Not on file    Inability: Not on file  . Transportation needs    Medical: Not on file     Non-medical: Not on file  Tobacco Use  . Smoking status: Never Smoker  . Smokeless tobacco: Never Used  Substance and Sexual Activity  . Alcohol use: No    Alcohol/week: 0.0 standard drinks  . Drug use: No  . Sexual activity: Not on file  Lifestyle  . Physical activity    Days per week: Not on file    Minutes per session: Not on file  . Stress: Not on file  Relationships  . Social Herbalist on phone: Not on file    Gets together: Not on file    Attends religious service: Not on file    Active member of club or organization: Not on file    Attends meetings of clubs or organizations: Not on file    Relationship status: Not on file  . Intimate partner violence    Fear of current or ex partner: Not on file    Emotionally abused: Not on file    Physically abused: Not on file    Forced sexual activity: Not on file  Other Topics Concern  . Not on file  Social History Narrative   Patient is widowed and her grandson stay with her.    Retired.   Education high school.   Right handed.   Caffeine coffee one cup daily.    Family History:    Family History  Problem Relation Age of Onset  . Heart attack Mother   . Diabetes Mother   . Stroke Father   . High blood pressure Father      ROS:  Please see the history of present illness.   All other ROS reviewed and negative.     Physical Exam/Data:   Vitals:   02/12/19 0921  BP: 118/70  Pulse: 68  SpO2: 100%  Weight: 169 lb (76.7 kg)  Height: 5\' 6"  (1.676 m)   @IOBRIEF @ Last 3 Weights 02/12/2019 12/05/2018 11/07/2018  Weight (lbs) 169 lb 168 lb 173 lb  Weight (kg) 76.658 kg 76.204 kg 78.472 kg     Body mass index is 27.28 kg/m.  General:  Well nourished, well developed, in no acute distress  HEENT: normal Lymph: no adenopathy Neck: no JVD Endocrine:  No thryomegaly Vascular: No carotid bruits; FA pulses 2+ bilaterally without bruits  Cardiac:  normal S1, S2; RRR; no murmur   Lungs:  clear to auscultation  bilaterally, no wheezing, rhonchi or rales  Abd: soft, nontender, no hepatomegaly  Ext: no edema Musculoskeletal:  No deformities, BUE and BLE strength normal and equal Skin: warm and dry  Neuro:  CNs 2-12 intact, no focal abnormalities noted Psych:  Normal affect   ECG:  11/07/18 SR rate 65 PR 236 low voltage no acute ST changes   Relevant CV  Studies: Echo 08/12/16 Carotid 08/13/16 [place] 12/26/18    Radiology/Studies:  No results found.  Assessment and Plan:   1. PAF:  Continue eliquis avoid AV nodal blocking drugs  2. HTN: Well controlled.  Continue current medications and low sodium Dash type diet.   3. HLD on statin labs with primary  4. Vertigo has antivert had normal MRI of head 08/12/16 5. Arteritis: f/u rheumatology off prednisone  6. Gout continue allopurinol quiescent  7. AV block:  Discussed prudence of PPM Rx with her again she is willing to proceed with Dr Rayann Heman will arrange f/u visit for him      For questions or updates, please contact [facility] Please consult [email or web address] for contact info under     Signed, Jenkins Rouge, MD  02/12/2019 9:44 AM

## 2019-02-11 ENCOUNTER — Telehealth: Payer: Self-pay | Admitting: *Deleted

## 2019-02-11 NOTE — Telephone Encounter (Signed)
Pt has answered NO to the covi19 screening questions.      COVID-19 Pre-Screening Questions:  . In the past 7 to 10 days have you had a cough,  shortness of breath, headache, congestion, fever (100 or greater) body aches, chills, sore throat, or sudden loss of taste or sense of smell? . Have you been around anyone with known Covid 19. . Have you been around anyone who is awaiting Covid 19 test results in the past 7 to 10 days? . Have you been around anyone who has been exposed to Covid 19, or has mentioned symptoms of Covid 19 within the past 7 to 10 days?  If you have any concerns/questions about symptoms patients report during screening (either on the phone or at threshold). Contact the provider seeing the patient or DOD for further guidance.  If neither are available contact a member of the leadership team.

## 2019-02-12 ENCOUNTER — Ambulatory Visit (INDEPENDENT_AMBULATORY_CARE_PROVIDER_SITE_OTHER): Payer: Medicare Other | Admitting: Cardiovascular Disease

## 2019-02-12 ENCOUNTER — Other Ambulatory Visit: Payer: Self-pay

## 2019-02-12 ENCOUNTER — Encounter: Payer: Self-pay | Admitting: Cardiovascular Disease

## 2019-02-12 VITALS — BP 118/70 | HR 68 | Ht 66.0 in | Wt 169.0 lb

## 2019-02-12 DIAGNOSIS — I480 Paroxysmal atrial fibrillation: Secondary | ICD-10-CM

## 2019-02-12 NOTE — Patient Instructions (Addendum)
Medication Instructions:  Your physician recommends that you continue on your current medications as directed. Please refer to the Current Medication list given to you today.  If you need a refill on your cardiac medications before your next appointment, please call your pharmacy.   Lab work: None Ordered  If you have labs (blood work) drawn today and your tests are completely normal, you will receive your results only by: Marland Kitchen MyChart Message (if you have MyChart) OR . A paper copy in the mail If you have any lab test that is abnormal or we need to change your treatment, we will call you to review the results.  Testing/Procedures: None Ordered   Follow-Up: Your physician recommends that you schedule a follow-up appointment with Dr. Rayann Heman for discussion of insertion of a pacemaker Dr. Jackalyn Lombard scheduler will call you for an appointment   At [facility], you and your health needs are our priority.  As part of our continuing mission to provide you with exceptional heart care, we have created designated Provider Care Teams.  These Care Teams include your primary Cardiologist (physician) and Advanced Practice Providers (APPs -  Physician Assistants and Nurse Practitioners) who all work together to provide you with the care you need, when you need it. You will need a follow up appointment in 6 months.  Please call our office 2 months in advance to schedule this appointment.  You may see Dr. Johnsie Cancel or one of the following Advanced Practice Providers on your designated Care Team:   Truitt Merle, NP Cecilie Kicks, NP . Kathyrn Drown, NP

## 2019-02-23 ENCOUNTER — Encounter: Payer: Self-pay | Admitting: Internal Medicine

## 2019-02-23 ENCOUNTER — Other Ambulatory Visit: Payer: Self-pay

## 2019-02-23 ENCOUNTER — Telehealth (INDEPENDENT_AMBULATORY_CARE_PROVIDER_SITE_OTHER): Payer: Medicare Other | Admitting: Internal Medicine

## 2019-02-23 VITALS — BP 110/78 | Ht 66.0 in | Wt 168.0 lb

## 2019-02-23 DIAGNOSIS — I480 Paroxysmal atrial fibrillation: Secondary | ICD-10-CM

## 2019-02-23 DIAGNOSIS — I10 Essential (primary) hypertension: Secondary | ICD-10-CM | POA: Diagnosis not present

## 2019-02-23 DIAGNOSIS — R002 Palpitations: Secondary | ICD-10-CM

## 2019-02-23 DIAGNOSIS — I495 Sick sinus syndrome: Secondary | ICD-10-CM

## 2019-02-23 DIAGNOSIS — I441 Atrioventricular block, second degree: Secondary | ICD-10-CM

## 2019-02-23 DIAGNOSIS — Z7901 Long term (current) use of anticoagulants: Secondary | ICD-10-CM

## 2019-02-23 NOTE — Progress Notes (Signed)
Electrophysiology TeleHealth Note   Due to national recommendations of social distancing due to [place] 19, an audio telehealth visit is felt to be most appropriate for this patient at this time.  Verbal consent was obtained by me for the telehealth visit today.  The patient does not have capability for a virtual visit.  A phone visit is therefore required today.   Date:  02/23/2019   ID:  Lori Wilson, DOB August 29, 1934, MRN [id number]  Location: patient's home  Provider location:  [facility]  Evaluation Performed: Follow-up visit  PCP:  Isaac Bliss, Rayford Halsted, MD   Electrophysiologist:  Dr Rayann Heman  Chief Complaint:  palpitations  History of Present Illness:    Lori Wilson is a 84 y.o. female who presents via telehealth conferencing today.  Since last being seen in our clinic, the patient reports doing very well.  She has occasional episodes of dizziness.  This has previously been attributed to second degree AV block.  Today, she denies symptoms of palpitations, chest pain, shortness of breath,  lower extremity edema, presyncope, or syncope.  The patient is otherwise without complaint today.  The patient denies symptoms of fevers, chills, cough, or new SOB worrisome for COVID 19.  Past Medical History:  Diagnosis Date  . Dizziness 08/12/2016  . Giant cell arteritis ([place]) 08/13/2016  . High cholesterol   . Hypertension   . Palpitations 09/23/2018  . Pneumonia   . Tremor 02/24/2014  . Vertigo 08/12/2016    Past Surgical History:  Procedure Laterality Date  . BLADDER SURGERY    . catract Left 04/06/2014  . VAGINAL HYSTERECTOMY      Current Outpatient Medications  Medication Sig Dispense Refill  . acetaminophen (TYLENOL) 325 MG tablet Take 650 mg by mouth every 6 (six) hours as needed for mild pain.    Marland Kitchen allopurinol (ZYLOPRIM) 100 MG tablet TAKE 1 TABLET(100 MG) BY MOUTH DAILY 90 tablet 1  . amLODipine (NORVASC) 5 MG tablet TAKE 1 TABLET(5 MG) BY MOUTH DAILY 90  tablet 1  . apixaban (ELIQUIS) 5 MG TABS tablet Take 1 tablet (5 mg total) by mouth 2 (two) times daily. 60 tablet 11  . aspirin 81 MG tablet Take 81 mg by mouth daily.    . dorzolamide (TRUSOPT) 2 % ophthalmic solution Place 1 drop into the left eye 2 (two) times daily.     . furosemide (LASIX) 20 MG tablet Take 0.5 tablets (10 mg total) by mouth daily. 45 tablet 1  . metoprolol tartrate (LOPRESSOR) 25 MG tablet Take 1 tablet (25 mg total) by mouth 2 (two) times daily. 60 tablet 11  . omeprazole (PRILOSEC) 20 MG capsule Take 20 mg by mouth every other day.    . potassium chloride (K-DUR) 10 MEQ tablet TAKE 1 TABLET(10 MEQ) BY MOUTH DAILY 90 tablet 1   No current facility-administered medications for this visit.     Allergies:   Patient has no known allergies.   Social History:  The patient  reports that she has never smoked. She has never used smokeless tobacco. She reports that she does not drink alcohol or use drugs.   Family History:  The patient's family history includes Diabetes in her mother; Heart attack in her mother; High blood pressure in her father; Stroke in her father.   ROS:  Please see the history of present illness.   All other systems are personally reviewed and negative.    Exam:    Vital Signs:  BP 110/78   Ht 5\' 6"  (1.676 m)   Wt 168 lb (76.2 kg)   BMI 27.12 kg/m   Well sounding    Labs/Other Tests and Data Reviewed:    Recent Labs: 12/01/2018: BUN 22; Creatinine, Ser 1.09; Hemoglobin 11.3; Platelets 236; Potassium 4.3; Sodium 136   Wt Readings from Last 3 Encounters:  02/23/19 168 lb (76.2 kg)  02/12/19 169 lb (76.7 kg)  12/05/18 168 lb (76.2 kg)       ASSESSMENT & PLAN:    1.  Second degree AV block The patient has symptomatic bradycardia.  I would therefore recommend pacemaker implantation at this time.  Risks, benefits, alternatives to pacemaker implantation were discussed in detail with the patient today. The patient understands that the risks  include but are not limited to bleeding, infection, pneumothorax, perforation, tamponade, vascular damage, renal failure, MI, stroke, death,  and lead dislodgement and wishes to wait. She does not wish to go to the hospital until "after this virus calms down". She states that if she has worsening symptoms of AV block, syncope, or presyncope.  2. Paroxysmal atrial fibrillation On eliquis She feels that her AF is currently well controlled. Will need to hold eliquis for 24 hours prior to PPM implantation if she decides to proceed.  3. HTN Stable No change required today  Follow-up:  3 months with me   Patient Risk:  after full review of this patients clinical status, I feel that they are at moderate risk at this time.  Today, I have spent 15 minutes with the patient with telehealth technology discussing arrhythmia management .    Army Fossa, MD  02/23/2019 3:08 PM     [facility] [address] [place] [address] [phone] (office) [phone] (fax)

## 2019-03-05 DIAGNOSIS — H402223 Chronic angle-closure glaucoma, left eye, severe stage: Secondary | ICD-10-CM | POA: Diagnosis not present

## 2019-03-05 DIAGNOSIS — H40052 Ocular hypertension, left eye: Secondary | ICD-10-CM | POA: Diagnosis not present

## 2019-03-06 ENCOUNTER — Telehealth: Payer: Self-pay | Admitting: Cardiovascular Disease

## 2019-03-06 ENCOUNTER — Emergency Department (HOSPITAL_COMMUNITY)
Admission: EM | Admit: 2019-03-06 | Discharge: 2019-03-07 | Disposition: A | Discharge status: Home / Self Care | Payer: Medicare Other | Attending: Emergency Medicine | Admitting: Emergency Medicine

## 2019-03-06 ENCOUNTER — Encounter (HOSPITAL_COMMUNITY): Payer: Self-pay | Admitting: Emergency Medicine

## 2019-03-06 ENCOUNTER — Other Ambulatory Visit: Payer: Self-pay

## 2019-03-06 ENCOUNTER — Telehealth: Payer: Self-pay

## 2019-03-06 ENCOUNTER — Ambulatory Visit: Payer: Self-pay

## 2019-03-06 DIAGNOSIS — R195 Other fecal abnormalities: Secondary | ICD-10-CM

## 2019-03-06 DIAGNOSIS — I10 Essential (primary) hypertension: Secondary | ICD-10-CM | POA: Diagnosis not present

## 2019-03-06 DIAGNOSIS — Z79899 Other long term (current) drug therapy: Secondary | ICD-10-CM | POA: Insufficient documentation

## 2019-03-06 DIAGNOSIS — Z7901 Long term (current) use of anticoagulants: Secondary | ICD-10-CM | POA: Diagnosis not present

## 2019-03-06 DIAGNOSIS — K921 Melena: Secondary | ICD-10-CM | POA: Diagnosis not present

## 2019-03-06 DIAGNOSIS — D68318 Other hemorrhagic disorder due to intrinsic circulating anticoagulants, antibodies, or inhibitors: Secondary | ICD-10-CM | POA: Diagnosis not present

## 2019-03-06 LAB — COMPREHENSIVE METABOLIC PANEL
ALT: 25 U/L (ref 0–44)
AST: 33 U/L (ref 15–41)
Albumin: 3.4 g/dL — ABNORMAL LOW (ref 3.5–5.0)
Alkaline Phosphatase: 68 U/L (ref 38–126)
Anion gap: 10 (ref 5–15)
BUN: 20 mg/dL (ref 8–23)
CO2: 20 mmol/L — ABNORMAL LOW (ref 22–32)
Calcium: 9.4 mg/dL (ref 8.9–10.3)
Chloride: 105 mmol/L (ref 98–111)
Creatinine, Ser: 1.39 mg/dL — ABNORMAL HIGH (ref 0.44–1.00)
GFR calc Af Amer: 41 mL/min — ABNORMAL LOW (ref >60)
GFR calc non Af Amer: 35 mL/min — ABNORMAL LOW (ref >60)
Glucose, Bld: 118 mg/dL — ABNORMAL HIGH (ref 70–99)
Potassium: 4.3 mmol/L (ref 3.5–5.1)
Sodium: 135 mmol/L (ref 135–145)
Total Bilirubin: 1 mg/dL (ref 0.3–1.2)
Total Protein: 7.7 g/dL (ref 6.5–8.1)

## 2019-03-06 LAB — TYPE AND SCREEN
ABO/RH(D): A POS
Antibody Screen: NEGATIVE

## 2019-03-06 LAB — CBC
HCT: 37.8 % (ref 36.0–46.0)
Hemoglobin: 11.7 g/dL — ABNORMAL LOW (ref 12.0–15.0)
MCH: 28.8 pg (ref 26.0–34.0)
MCHC: 31 g/dL (ref 30.0–36.0)
MCV: 93.1 fL (ref 80.0–100.0)
Platelets: 274 10*3/uL (ref 150–400)
RBC: 4.06 MIL/uL (ref 3.87–5.11)
RDW: 13.4 % (ref 11.5–15.5)
WBC: 7.6 10*3/uL (ref 4.0–10.5)
nRBC: 0 % (ref 0.0–0.2)

## 2019-03-06 LAB — ABO/RH: ABO/RH(D): A POS

## 2019-03-06 LAB — POC OCCULT BLOOD, ED: Fecal Occult Bld: NEGATIVE

## 2019-03-06 NOTE — ED Provider Notes (Signed)
[facility] EMERGENCY DEPARTMENT Provider Note   CSN: [id number] Arrival date & time: 03/06/19  1651     History   Chief Complaint Chief Complaint  Patient presents with  . GI Bleeding    HPI Lori Wilson is a 84 y.o. female.     Patient is an 84 year old female with past medical history of palpitations, hypertension, high cholesterol, and vertigo.  She presents today for evaluation of dark stools.  Patient states that this has been occurring for the past 2 days.  She denies any abdominal pain, fevers, or chills.  She denies any chest pain, shortness of breath, dizziness.  Patient does take Eliquis.  The history is provided by the patient.    Past Medical History:  Diagnosis Date  . Dizziness 08/12/2016  . Giant cell arteritis ([place]) 08/13/2016  . High cholesterol   . Hypertension   . Pneumonia   . Second degree Mobitz II AV block   . Tremor 02/24/2014  . Vertigo 08/12/2016    Patient Active Problem List   Diagnosis Date Noted  . Palpitations 09/23/2018  . Giant cell arteritis ([place]) 08/13/2016  . Dizziness 08/12/2016  . Vertigo 08/12/2016  . Tremor 02/24/2014  . Hypertension   . High cholesterol     Past Surgical History:  Procedure Laterality Date  . BLADDER SURGERY    . catract Left 04/06/2014  . VAGINAL HYSTERECTOMY       OB History   No obstetric history on file.      Home Medications    Prior to Admission medications   Medication Sig Start Date End Date Taking? Authorizing Provider  acetaminophen (TYLENOL) 325 MG tablet Take 650 mg by mouth every 6 (six) hours as needed for mild pain.    [provider]  allopurinol (ZYLOPRIM) 100 MG tablet TAKE 1 TABLET(100 MG) BY MOUTH DAILY 01/02/19   Isaac Bliss, Rayford Halsted, MD  amLODipine (NORVASC) 5 MG tablet TAKE 1 TABLET(5 MG) BY MOUTH DAILY 12/30/18   Isaac Bliss, Rayford Halsted, MD  apixaban (ELIQUIS) 5 MG TABS tablet Take 1 tablet (5 mg total) by mouth 2 (two) times daily.  11/28/18   Josue Hector, MD  aspirin 81 MG tablet Take 81 mg by mouth daily.    [provider]  dorzolamide (TRUSOPT) 2 % ophthalmic solution Place 1 drop into the left eye 2 (two) times daily.     [provider]  furosemide (LASIX) 20 MG tablet Take 0.5 tablets (10 mg total) by mouth daily. 12/05/18   Isaac Bliss, Rayford Halsted, MD  metoprolol tartrate (LOPRESSOR) 25 MG tablet Take 1 tablet (25 mg total) by mouth 2 (two) times daily. 11/28/18   Josue Hector, MD  omeprazole (PRILOSEC) 20 MG capsule Take 20 mg by mouth every other day.    [provider]  potassium chloride (K-DUR) 10 MEQ tablet TAKE 1 TABLET(10 MEQ) BY MOUTH DAILY 08/14/18   Isaac Bliss, Rayford Halsted, MD    Family History Family History  Problem Relation Age of Onset  . Heart attack Mother   . Diabetes Mother   . Stroke Father   . High blood pressure Father     Social History Social History   Tobacco Use  . Smoking status: Never Smoker  . Smokeless tobacco: Never Used  Substance Use Topics  . Alcohol use: No    Alcohol/week: 0.0 standard drinks  . Drug use: No     Allergies   Patient has  no known allergies.   Review of Systems Review of Systems  All other systems reviewed and are negative.    Physical Exam Updated Vital Signs BP (!) 147/79   Pulse 69   Temp 98.2 F (36.8 C) (Oral)   Resp 19   SpO2 97%   Physical Exam Vitals signs and nursing note reviewed.  Constitutional:      General: She is not in acute distress.    Appearance: She is well-developed. She is not diaphoretic.  HENT:     Head: Normocephalic and atraumatic.  Neck:     Musculoskeletal: Normal range of motion and neck supple.  Cardiovascular:     Rate and Rhythm: Normal rate and regular rhythm.     Heart sounds: No murmur. No friction rub. No gallop.   Pulmonary:     Effort: Pulmonary effort is normal. No respiratory distress.     Breath sounds: Normal breath sounds. No wheezing.   Abdominal:     General: Bowel sounds are normal. There is no distension.     Palpations: Abdomen is soft.     Tenderness: There is no abdominal tenderness.  Genitourinary:    Rectum: Normal. Guaiac result negative.     Comments: There is brown stool present.  It is not bloody or melanotic appearing. Musculoskeletal: Normal range of motion.  Skin:    General: Skin is warm and dry.  Neurological:     Mental Status: She is alert and oriented to person, place, and time.      ED Treatments / Results  Labs (all labs ordered are listed, but only abnormal results are displayed) Labs Reviewed  COMPREHENSIVE METABOLIC PANEL - Abnormal; Notable for the following components:      Result Value   CO2 20 (*)    Glucose, Bld 118 (*)    Creatinine, Ser 1.39 (*)    Albumin 3.4 (*)    GFR calc non Af Amer 35 (*)    GFR calc Af Amer 41 (*)    All other components within normal limits  CBC - Abnormal; Notable for the following components:   Hemoglobin 11.7 (*)    All other components within normal limits  POC OCCULT BLOOD, ED  TYPE AND SCREEN  ABO/RH    EKG None  Radiology No results found.  Procedures Procedures (including critical care time)  Medications Ordered in ED Medications - No data to display   Initial Impression / Assessment and Plan / ED Course  I have reviewed the triage vital signs and the nursing notes.  Pertinent labs & imaging results that were available during my care of the patient were reviewed by me and considered in my medical decision making (see chart for details).  Patient presenting here at the request of her primary doctor for evaluation of dark stools.  Patient has been on Eliquis.  She spoke to her primary doctor who told her to come here to be evaluated.  Patient's rectal exam reveals brown stool which is heme-negative.  Her hemoglobin is 11.7 which is actually higher than her last CBC several months ago.  Her vitals are stable and abdomen is benign.   I see no indication for admission and feel as though patient can be safely discharged.  She is to return if her symptoms worsen or change.  Final Clinical Impressions(s) / ED Diagnoses   Final diagnoses:  None    ED Discharge Orders    None       Miku Udall, Nathaneil Canary,  MD 03/06/19 2351

## 2019-03-06 NOTE — Telephone Encounter (Signed)
Pt. Reports she has had black stools x 3 days. Taking Eliquis x 1 month from cardiology. Called cardiology and they recommend she notify her PCP for possible lab work and check up. Denies any bright red blood or diarrhea. No fever or abdominal pain. No answer at the practice - will forward triage note. Please advise pt.  Answer Assessment - Initial Assessment Questions 1. COLOR: "What color is it?" "Is that color in part or all of the stool?"     Black 2. ONSET: "When was the unusual color first noted?"     3 days ago 3. CAUSE: "Have you eaten any food or taken any medicine of this color?" (See listing in BACKGROUND)     No 4. OTHER SYMPTOMS: "Do you have any other symptoms?" (e.g., diarrhea, jaundice, abdominal pain, fever).     No  Protocols used: STOOLS - UNUSUAL COLOR-A-AH

## 2019-03-06 NOTE — ED Notes (Signed)
Please call son Lynnaya Lappin  @ [phone] a status update--Adaliz Dobis

## 2019-03-06 NOTE — Telephone Encounter (Signed)
Patient has been taking Eliquis for about a month and has had dark black/green stool x 3 days that has gotten progressively darker each day. Pt denies any diet or med changes, denies taking any supplements other than a laxative yesterday. She would like to know if this could be caused by the Eliquis, will route to [facility] for advisement

## 2019-03-06 NOTE — Telephone Encounter (Signed)
Pt agreeable to follow up with PCP for lab work and blood stool test. Made her an appt for coumadin clinic on 8/26. Advised pt to call us back with any other concerns.

## 2019-03-06 NOTE — Telephone Encounter (Signed)
Not clear what "dark stools are. Needs Hb/Hct and f/u primary to get cologard test for blood in stool can have f/u in coumadin clinic next week

## 2019-03-06 NOTE — Telephone Encounter (Signed)
Recommend ER visit.

## 2019-03-06 NOTE — ED Triage Notes (Signed)
Pt c/o dark, tarry stools that started yesterday. Denies pain. States she was started on Eliquis x 3 weeks ago.

## 2019-03-06 NOTE — Telephone Encounter (Signed)
No need to be seen in the coumadin clinic. Patient needs to follow up with her PCP.Please call patient and cancel appointment without coumadin clinic. Thanks

## 2019-03-06 NOTE — Telephone Encounter (Signed)
Patient is aware of Dr Ledell Noss recommendations and verbally agreed.

## 2019-03-06 NOTE — ED Notes (Signed)
Lori [id number] [phone], call when pt gets back to a room

## 2019-03-06 NOTE — Telephone Encounter (Signed)
Canceled coumadin clinic appt per Ian Bushman PharD request. Pt agreeable and will followup with PCP as discussed in earlier call.

## 2019-03-06 NOTE — Discharge Instructions (Addendum)
Continue medications as previously prescribed.  Return to the emergency department if you develop bloody stools, dizziness, lightheadedness, shortness of breath, abdominal pain, high fever, or other new and concerning symptoms.

## 2019-03-06 NOTE — ED Notes (Signed)
Phone call made to pt's son per pt's request. Son Araceli Bouche updated by pt

## 2019-03-06 NOTE — Telephone Encounter (Signed)
New message    Patient has some questions on her medications  Pt c/o medication issue:  1. Name of Medication: eliquis  2. How are you currently taking this medication (dosage and times per day)? 2x a day   3. Are you having a reaction (difficulty breathing--STAT)? yes  4. What is your medication issue? Black stool

## 2019-03-07 NOTE — ED Notes (Signed)
Discharge instructions discussed with pt. Pt verbalized understanding. No questions at this time. Pt to go home with son

## 2019-03-11 ENCOUNTER — Ambulatory Visit: Payer: Medicare Other

## 2019-03-25 ENCOUNTER — Other Ambulatory Visit: Payer: Self-pay | Admitting: Internal Medicine

## 2019-03-30 ENCOUNTER — Telehealth: Payer: Self-pay

## 2019-03-30 NOTE — Telephone Encounter (Signed)
Attempted outreach to Pt to check in.  No answer.  Pt has follow up in November.  Will put on list for possible PPM placement.  Per last OV Pt aware to call if worsening dizziness or syncope.

## 2019-05-21 ENCOUNTER — Other Ambulatory Visit: Payer: Self-pay | Admitting: Internal Medicine

## 2019-05-21 DIAGNOSIS — I10 Essential (primary) hypertension: Secondary | ICD-10-CM

## 2019-06-02 NOTE — Progress Notes (Signed)
Patient ID: Lori Wilson MRN: [id number]; DOB: 1935/07/03  Admit date: (Not on file) Date of Consult: 06/04/2019  Primary Care Provider: Isaac Bliss, Rayford Halsted, MD Primary Cardiologist: New/Marabella Popiel Primary Electrophysiologist:  Allred    History of Present Illness:   Lori Wilson 84 y.o. no history of cardiac disease. First seen 11/07/18  CRF;s HTN and HLD history of gout , vertigo and pneumonia. Pevious giant cell arteritis now off prednisone Seen by primary 09/23/27 with 2 week complaint of palpitations. Heart beating in her neck Occurs daily Gets fatigue and needs to rest No associated syncope dyspnea or chest pain Pulse was 72 in office with NSR on ECG Echo done 08/12/16 for dizziness was normal with EF 60-65% just mild AR and aortic root 4.2 cm Carotid done for same reason plaque no stenosis in ICA;s   She is widowed over 74 years Raised 4 kids with youngest son living with her Symptoms worse at night resting   F/U monitor showed PAF. Started on eliquis and supposed to start metoprolol 25 bid but not clear that she ever picked it up. Subsequently monitor showed 2nd degree AV block with pulses in mid 30's asymptomatic. Seen on televisit by Dr Rayann Heman 5.22/20 recommended pacing and patient declined Seen again on Tele visit by Dr Rayann Heman and deferred still   Monitor reviewed 12/26/18 with rare PAF rates 150 but frequent episodes of 2:1 AV block  Labs 12/01/18 K 4.3 Cr 1.1 Hct 33   Long discussion wit her about need for pacer and the procedure  Previously deferred due to eye problems which are resolved Also worried about not being able to use microwave which I reassured her about After much discussion willing to proceed  Discussed with Dr Jackalyn Lombard nurse and PPM arranged for 06/30/19 Discussed procedure and risks with patient including infection pneumothorax and tamponade Willing to proceed. Went over COVID testing and admission procedure  She has not had any syncope in the interim Some  dizzy spells and poor balance   Past Medical History:  Diagnosis Date  . Dizziness 08/12/2016  . Giant cell arteritis ([place]) 08/13/2016  . High cholesterol   . Hypertension   . Pneumonia   . Second degree Mobitz II AV block   . Tremor 02/24/2014  . Vertigo 08/12/2016    Past Surgical History:  Procedure Laterality Date  . BLADDER SURGERY    . catract Left 04/06/2014  . VAGINAL HYSTERECTOMY       Home Medications:  Prior to Admission medications   Medication Sig Start Date End Date Taking? Authorizing Provider  acetaminophen (TYLENOL) 325 MG tablet Take 650 mg by mouth every 6 (six) hours as needed for mild pain.    [provider]  allopurinol (ZYLOPRIM) 100 MG tablet Take 1 tablet (100 mg total) by mouth daily. 07/02/18   Isaac Bliss, Rayford Halsted, MD  amLODipine (NORVASC) 5 MG tablet Take 1 tablet (5 mg total) by mouth daily. 07/02/18   Isaac Bliss, Rayford Halsted, MD  aspirin 81 MG tablet Take 81 mg by mouth daily.    [provider]  atorvastatin (LIPITOR) 10 MG tablet Take 1 tablet (10 mg total) by mouth daily. Patient not taking: Reported on 09/23/2018 07/02/18   Isaac Bliss, Rayford Halsted, MD  dorzolamide (TRUSOPT) 2 % ophthalmic solution Place 1 drop into the left eye 2 (two) times daily.     [provider]  furosemide (LASIX) 20 MG tablet Take 0.5 tablets (10 mg total) by mouth daily.  07/02/18   Isaac Bliss, Rayford Halsted, MD  hydroxypropyl methylcellulose / hypromellose (ISOPTO TEARS / GONIOVISC) 2.5 % ophthalmic solution Place 1 drop into both eyes 3 (three) times daily as needed for dry eyes.    [provider]  meclizine (ANTIVERT) 12.5 MG tablet Take 1 tablet (12.5 mg total) by mouth 2 (two) times daily as needed for dizziness. 07/02/18   Isaac Bliss, Rayford Halsted, MD  potassium chloride (K-DUR) 10 MEQ tablet TAKE 1 TABLET(10 MEQ) BY MOUTH DAILY 08/14/18   Isaac Bliss, Rayford Halsted, MD    Inpatient Medications: Scheduled Meds:   Continuous Infusions:  PRN Meds:   Allergies:   No Known Allergies  Social History:   Social History   Socioeconomic History  . Marital status: Widowed    Spouse name: Not on file  . Number of children: 4  . Years of education: 33  . Highest education level: Not on file  Occupational History    Employer: RETIRED    Comment: Retired  Scientific laboratory technician  . Financial resource strain: Not on file  . Food insecurity    Worry: Not on file    Inability: Not on file  . Transportation needs    Medical: Not on file    Non-medical: Not on file  Tobacco Use  . Smoking status: Never Smoker  . Smokeless tobacco: Never Used  Substance and Sexual Activity  . Alcohol use: No    Alcohol/week: 0.0 standard drinks  . Drug use: No  . Sexual activity: Not on file  Lifestyle  . Physical activity    Days per week: Not on file    Minutes per session: Not on file  . Stress: Not on file  Relationships  . Social Herbalist on phone: Not on file    Gets together: Not on file    Attends religious service: Not on file    Active member of club or organization: Not on file    Attends meetings of clubs or organizations: Not on file    Relationship status: Not on file  . Intimate partner violence    Fear of current or ex partner: Not on file    Emotionally abused: Not on file    Physically abused: Not on file    Forced sexual activity: Not on file  Other Topics Concern  . Not on file  Social History Narrative   Patient is widowed and her grandson stay with her.    Retired.   Education high school.   Right handed.   Caffeine coffee one cup daily.    Family History:    Family History  Problem Relation Age of Onset  . Heart attack Mother   . Diabetes Mother   . Stroke Father   . High blood pressure Father      ROS:  Please see the history of present illness.   All other ROS reviewed and negative.     Physical Exam/Data:   Vitals:   06/04/19 0920  BP: (!) 110/52   Pulse: 70  SpO2: 99%  Weight: 168 lb (76.2 kg)  Height: 5\' 6"  (1.676 m)   @IOBRIEF @ Last 3 Weights 06/04/2019 06/03/2019 02/23/2019  Weight (lbs) 168 lb 170 lb 168 lb  Weight (kg) 76.204 kg 77.111 kg 76.204 kg     Body mass index is 27.12 kg/m.   Affect appropriate Healthy:  appears stated age 54: normal Neck supple with no adenopathy JVP normal no bruits no thyromegaly Lungs  clear with no wheezing and good diaphragmatic motion Heart:  S1/S2 no murmur, no rub, gallop or click PMI normal Abdomen: benighn, BS positve, no tenderness, no AAA no bruit.  No HSM or HJR Distal pulses intact with no bruits No edema Neuro non-focal Skin warm and dry No muscular weakness   ECG:  11/07/18 SR rate 65 PR 236 low voltage no acute ST changes   Relevant CV Studies: Echo 08/12/16 Carotid 08/13/16 [place] 12/26/18    Radiology/Studies:  No results found.  Assessment and Plan:   1. PAF:  Continue eliquis avoid AV nodal blocking drugs  2. HTN: Well controlled.  Continue current medications and low sodium Dash type diet.   3. HLD on statin labs with primary  4. Vertigo has antivert had normal MRI of head 08/12/16 5. Arteritis: f/u rheumatology off prednisone  6. Gout continue allopurinol quiescent  AV block:  Discussed prudence of PPM Rx with her Arranged for 06/30/19 with Dr Rayann Heman All questions answered She is right handed and will have left infraclavicular placement   Time spent with patient including arranging PPM and discussing COVID testing and  Lab 45 minutes     For questions or updates, please contact [facility] Please consult [email or web address] for contact info under     Signed, Jenkins Rouge, MD  06/04/2019 9:31 AM

## 2019-06-03 ENCOUNTER — Encounter: Payer: Self-pay | Admitting: Internal Medicine

## 2019-06-03 ENCOUNTER — Other Ambulatory Visit: Payer: Self-pay

## 2019-06-03 ENCOUNTER — Telehealth (INDEPENDENT_AMBULATORY_CARE_PROVIDER_SITE_OTHER): Payer: Medicare Other | Admitting: Internal Medicine

## 2019-06-03 VITALS — BP 123/77 | HR 72 | Ht 66.0 in | Wt 170.0 lb

## 2019-06-03 DIAGNOSIS — I480 Paroxysmal atrial fibrillation: Secondary | ICD-10-CM | POA: Diagnosis not present

## 2019-06-03 DIAGNOSIS — I10 Essential (primary) hypertension: Secondary | ICD-10-CM | POA: Diagnosis not present

## 2019-06-03 DIAGNOSIS — I441 Atrioventricular block, second degree: Secondary | ICD-10-CM | POA: Diagnosis not present

## 2019-06-03 NOTE — H&P (View-Only) (Signed)
Electrophysiology TeleHealth Note  Due to national recommendations of social distancing due to [place] 19, an audio telehealth visit is felt to be most appropriate for this patient at this time.  Verbal consent was obtained by me for the telehealth visit today.  The patient does not have capability for a virtual visit.  A phone visit is therefore required today.   Date:  06/03/2019   ID:  Lori Wilson, DOB [birth year], MRN [id number]  Location: patient's home  Provider location:  [facility]  Evaluation Performed: Follow-up visit  PCP:  Lori Wilson, Rayford Halsted, MD   Electrophysiologist:  Dr Rayann Heman  Chief Complaint:  dizziness  History of Present Illness:    Lori Wilson is a 84 y.o. female who presents via telehealth conferencing today.  Since last being seen in our clinic, the patient reports doing very well.  She is active.  + episodes of dizziness but no syncope.  Today, she denies symptoms of chest pain, shortness of breath,  lower extremity edema, presyncope, or syncope.  The patient is otherwise without complaint today.  The patient denies symptoms of fevers, chills, cough, or new SOB worrisome for COVID 19.  Past Medical History:  Diagnosis Date  . Dizziness 08/12/2016  . Giant cell arteritis ([place]) 08/13/2016  . High cholesterol   . Hypertension   . Pneumonia   . Second degree Mobitz II AV block   . Tremor 02/24/2014  . Vertigo 08/12/2016    Past Surgical History:  Procedure Laterality Date  . BLADDER SURGERY    . catract Left 04/06/2014  . VAGINAL HYSTERECTOMY      Current Outpatient Medications  Medication Sig Dispense Refill  . acetaminophen (TYLENOL) 325 MG tablet Take 650 mg by mouth every 6 (six) hours as needed for mild pain.    Marland Kitchen allopurinol (ZYLOPRIM) 100 MG tablet TAKE 1 TABLET(100 MG) BY MOUTH DAILY 90 tablet 1  . amLODipine (NORVASC) 5 MG tablet TAKE 1 TABLET(5 MG) BY MOUTH DAILY 90 tablet 1  . apixaban (ELIQUIS) 5 MG TABS tablet Take 1  tablet (5 mg total) by mouth 2 (two) times daily. 60 tablet 11  . aspirin 81 MG tablet Take 81 mg by mouth daily.    . dorzolamide (TRUSOPT) 2 % ophthalmic solution Place 1 drop into the left eye 2 (two) times daily.     . furosemide (LASIX) 20 MG tablet TAKE 1/2 TABLET(10 MG) BY MOUTH DAILY 45 tablet 1  . metoprolol tartrate (LOPRESSOR) 25 MG tablet Take 1 tablet (25 mg total) by mouth 2 (two) times daily. 60 tablet 11  . omeprazole (PRILOSEC) 20 MG capsule Take 20 mg by mouth every other day.    . potassium chloride (K-DUR) 10 MEQ tablet TAKE 1 TABLET(10 MEQ) BY MOUTH DAILY 90 tablet 1   No current facility-administered medications for this visit.     Allergies:   Patient has no known allergies.   Social History:  The patient  reports that she has never smoked. She has never used smokeless tobacco. She reports that she does not drink alcohol or use drugs.   Family History:  The patient's family history includes Diabetes in her mother; Heart attack in her mother; High blood pressure in her father; Stroke in her father.   ROS:  Please see the history of present illness.   All other systems are personally reviewed and negative.    Exam:    Vital Signs:  BP 123/77   Pulse  72   Ht 5\' 6"  (1.676 m)   Wt 170 lb (77.1 kg)   BMI 27.44 kg/m   Well sounding, alert and conversant   Labs/Other Tests and Data Reviewed:    Recent Labs: 03/06/2019: ALT 25; BUN 20; Creatinine, Ser 1.39; Hemoglobin 11.7; Platelets 274; Potassium 4.3; Sodium 135   Wt Readings from Last 3 Encounters:  06/03/19 170 lb (77.1 kg)  02/23/19 168 lb (76.2 kg)  02/12/19 169 lb (76.7 kg)       ASSESSMENT & PLAN:    1.  Second degree AV block Symptoms of dizziness with documented 2:1 av block No reversible causes I would therefore recommend pacemaker implantation at this time.  Risks, benefits, alternatives to pacemaker implantation (leadless and transvenous) have been discussed in detail with the patient today.  The patient wishes to proceed. We will schedule the procedure at the next available time. I will plan leadless pacing.  2. HTN Stable No change required today  3. Paroxysmal atrial fibrillation Hold eliquis for 24 hours prior to ppm implant.     Patient Risk:  after full review of this patients clinical status, I feel that they are at moderate risk at this time.  Today, I have spent 15 minutes with the patient with telehealth technology discussing arrhythmia management .    Army Fossa, MD  06/03/2019 12:13 PM     [address] [place]  [phone] (office) [phone] (fax)

## 2019-06-03 NOTE — Progress Notes (Signed)
Electrophysiology TeleHealth Note  Due to national recommendations of social distancing due to [place] 19, an audio telehealth visit is felt to be most appropriate for this patient at this time.  Verbal consent was obtained by me for the telehealth visit today.  The patient does not have capability for a virtual visit.  A phone visit is therefore required today.   Date:  06/03/2019   ID:  Lori Wilson, DOB [birth year], MRN [id number]  Location: patient's home  Provider location:  [facility]  Evaluation Performed: Follow-up visit  PCP:  Isaac Bliss, Rayford Halsted, MD   Electrophysiologist:  Dr Rayann Heman  Chief Complaint:  dizziness  History of Present Illness:    Lori Wilson is a 84 y.o. female who presents via telehealth conferencing today.  Since last being seen in our clinic, the patient reports doing very well.  She is active.  + episodes of dizziness but no syncope.  Today, she denies symptoms of chest pain, shortness of breath,  lower extremity edema, presyncope, or syncope.  The patient is otherwise without complaint today.  The patient denies symptoms of fevers, chills, cough, or new SOB worrisome for COVID 19.  Past Medical History:  Diagnosis Date  . Dizziness 08/12/2016  . Giant cell arteritis ([place]) 08/13/2016  . High cholesterol   . Hypertension   . Pneumonia   . Second degree Mobitz II AV block   . Tremor 02/24/2014  . Vertigo 08/12/2016    Past Surgical History:  Procedure Laterality Date  . BLADDER SURGERY    . catract Left 04/06/2014  . VAGINAL HYSTERECTOMY      Current Outpatient Medications  Medication Sig Dispense Refill  . acetaminophen (TYLENOL) 325 MG tablet Take 650 mg by mouth every 6 (six) hours as needed for mild pain.    Marland Kitchen allopurinol (ZYLOPRIM) 100 MG tablet TAKE 1 TABLET(100 MG) BY MOUTH DAILY 90 tablet 1  . amLODipine (NORVASC) 5 MG tablet TAKE 1 TABLET(5 MG) BY MOUTH DAILY 90 tablet 1  . apixaban (ELIQUIS) 5 MG TABS tablet Take 1  tablet (5 mg total) by mouth 2 (two) times daily. 60 tablet 11  . aspirin 81 MG tablet Take 81 mg by mouth daily.    . dorzolamide (TRUSOPT) 2 % ophthalmic solution Place 1 drop into the left eye 2 (two) times daily.     . furosemide (LASIX) 20 MG tablet TAKE 1/2 TABLET(10 MG) BY MOUTH DAILY 45 tablet 1  . metoprolol tartrate (LOPRESSOR) 25 MG tablet Take 1 tablet (25 mg total) by mouth 2 (two) times daily. 60 tablet 11  . omeprazole (PRILOSEC) 20 MG capsule Take 20 mg by mouth every other day.    . potassium chloride (K-DUR) 10 MEQ tablet TAKE 1 TABLET(10 MEQ) BY MOUTH DAILY 90 tablet 1   No current facility-administered medications for this visit.     Allergies:   Patient has no known allergies.   Social History:  The patient  reports that she has never smoked. She has never used smokeless tobacco. She reports that she does not drink alcohol or use drugs.   Family History:  The patient's family history includes Diabetes in her mother; Heart attack in her mother; High blood pressure in her father; Stroke in her father.   ROS:  Please see the history of present illness.   All other systems are personally reviewed and negative.    Exam:    Vital Signs:  BP 123/77   Pulse  72   Ht 5\' 6"  (1.676 m)   Wt 170 lb (77.1 kg)   BMI 27.44 kg/m   Well sounding, alert and conversant   Labs/Other Tests and Data Reviewed:    Recent Labs: 03/06/2019: ALT 25; BUN 20; Creatinine, Ser 1.39; Hemoglobin 11.7; Platelets 274; Potassium 4.3; Sodium 135   Wt Readings from Last 3 Encounters:  06/03/19 170 lb (77.1 kg)  02/23/19 168 lb (76.2 kg)  02/12/19 169 lb (76.7 kg)       ASSESSMENT & PLAN:    1.  Second degree AV block Symptoms of dizziness with documented 2:1 av block No reversible causes I would therefore recommend pacemaker implantation at this time.  Risks, benefits, alternatives to pacemaker implantation (leadless and transvenous) have been discussed in detail with the patient today.  The patient wishes to proceed. We will schedule the procedure at the next available time. I will plan leadless pacing.  2. HTN Stable No change required today  3. Paroxysmal atrial fibrillation Hold eliquis for 24 hours prior to ppm implant.     Patient Risk:  after full review of this patients clinical status, I feel that they are at moderate risk at this time.  Today, I have spent 15 minutes with the patient with telehealth technology discussing arrhythmia management .    Army Fossa, MD  06/03/2019 12:13 PM     [address] [place] [address] [phone] (office) [phone] (fax)

## 2019-06-04 ENCOUNTER — Telehealth: Payer: Self-pay | Admitting: *Deleted

## 2019-06-04 ENCOUNTER — Encounter: Payer: Self-pay | Admitting: Cardiovascular Disease

## 2019-06-04 ENCOUNTER — Other Ambulatory Visit: Payer: Self-pay

## 2019-06-04 ENCOUNTER — Ambulatory Visit (INDEPENDENT_AMBULATORY_CARE_PROVIDER_SITE_OTHER): Payer: Medicare Other | Admitting: Cardiovascular Disease

## 2019-06-04 VITALS — BP 110/52 | HR 70 | Ht 66.0 in | Wt 168.0 lb

## 2019-06-04 DIAGNOSIS — I442 Atrioventricular block, complete: Secondary | ICD-10-CM

## 2019-06-04 DIAGNOSIS — Z01812 Encounter for preprocedural laboratory examination: Secondary | ICD-10-CM

## 2019-06-04 DIAGNOSIS — R001 Bradycardia, unspecified: Secondary | ICD-10-CM

## 2019-06-04 NOTE — Telephone Encounter (Signed)
Spoke to pt yesterday afternoon to schedule MDT leadless PPM for 12/11 and  Covid screening for 12/15.  Pt was agreeable.  Pt made aware yesterday that I would call today to go over instructions for Covid screening and procedure. Attempted, have to leave message. Left detailed message informing her I would call her next week to go over instructions.

## 2019-06-04 NOTE — Patient Instructions (Addendum)
Your physician recommends that you continue on your current medications as directed. Please refer to the Current Medication list given to you today.  Your physician wants you to follow-up in: YEAR WITH DR NISHAN You will receive a reminder letter in the mail two months in advance. If you don't receive a letter, please call our office to schedule the follow-up appointment.  

## 2019-06-09 ENCOUNTER — Other Ambulatory Visit: Payer: Self-pay | Admitting: Internal Medicine

## 2019-06-09 DIAGNOSIS — I10 Essential (primary) hypertension: Secondary | ICD-10-CM

## 2019-06-15 NOTE — Telephone Encounter (Signed)
Spoke w/ pt. Instructions reviewed w/ pt.  Instructed to hold Eliquis 24 hr prior to procedure.  NPO after MN.  Hold medications the morning of the procedure. . Aware office will call to arrange follow up. Covid prescreening scheduled for 12/8. Patient verbalized understanding and agreeable to plan.

## 2019-06-16 NOTE — Addendum Note (Signed)
Addended by: Stanton Kidney on: 06/16/2019 01:21 PM   Modules accepted: Orders

## 2019-06-16 NOTE — Telephone Encounter (Signed)
Called pt back and scheduled her pre procedure blood work next Tuesday, prior to her Covid screening. Pt agreeable to stopping by office around 8:45 am for pre procedure blood work and then going to drive-thru Covid screening at 9:15 am Patient verbalized understanding and agreeable to plan.

## 2019-06-18 ENCOUNTER — Telehealth: Payer: Self-pay | Admitting: Internal Medicine

## 2019-06-18 NOTE — Telephone Encounter (Signed)
Informed that son does not need to be covid screened.  Did advise that since he lived in the home with her, that after she is screened they should wear masks if close to each other in the home and to distance as much as possible. Patient verbalized understanding and agreeable to plan.

## 2019-06-18 NOTE — Telephone Encounter (Signed)
New Message:     Pt is having Cath on 06-26-19. She is going to be tested for COVID. Her question is, since her son is around her all the time, will he need to have the COVID test too?

## 2019-06-23 ENCOUNTER — Other Ambulatory Visit: Payer: Self-pay

## 2019-06-23 ENCOUNTER — Other Ambulatory Visit: Payer: Medicare Other

## 2019-06-23 ENCOUNTER — Other Ambulatory Visit (HOSPITAL_COMMUNITY)
Admission: RE | Admit: 2019-06-23 | Discharge: 2019-06-23 | Disposition: A | Discharge status: Home / Self Care | Payer: Medicare Other | Source: Ambulatory Visit | Attending: Internal Medicine | Admitting: Internal Medicine

## 2019-06-23 DIAGNOSIS — R001 Bradycardia, unspecified: Secondary | ICD-10-CM

## 2019-06-23 DIAGNOSIS — Z01812 Encounter for preprocedural laboratory examination: Secondary | ICD-10-CM | POA: Diagnosis not present

## 2019-06-23 DIAGNOSIS — Z20828 Contact with and (suspected) exposure to other viral communicable diseases: Secondary | ICD-10-CM | POA: Insufficient documentation

## 2019-06-23 LAB — BASIC METABOLIC PANEL
BUN/Creatinine Ratio: 16 (ref 12–28)
BUN: 20 mg/dL (ref 8–27)
CO2: 25 mmol/L (ref 20–29)
Calcium: 9.3 mg/dL (ref 8.7–10.3)
Chloride: 102 mmol/L (ref 96–106)
Creatinine, Ser: 1.28 mg/dL — ABNORMAL HIGH (ref 0.57–1.00)
GFR calc Af Amer: 45 mL/min/{1.73_m2} — ABNORMAL LOW (ref >59)
GFR calc non Af Amer: 39 mL/min/{1.73_m2} — ABNORMAL LOW (ref >59)
Glucose: 91 mg/dL (ref 65–99)
Potassium: 4.2 mmol/L (ref 3.5–5.2)
Sodium: 138 mmol/L (ref 134–144)

## 2019-06-23 LAB — CBC WITH DIFFERENTIAL/PLATELET
Basophils Absolute: 0 10*3/uL (ref 0.0–0.2)
Basos: 0 %
EOS (ABSOLUTE): 0.4 10*3/uL (ref 0.0–0.4)
Eos: 6 %
Hematocrit: 34.1 % (ref 34.0–46.6)
Hemoglobin: 11.6 g/dL (ref 11.1–15.9)
Lymphocytes Absolute: 2.8 10*3/uL (ref 0.7–3.1)
Lymphs: 49 %
MCH: 29.4 pg (ref 26.6–33.0)
MCHC: 34 g/dL (ref 31.5–35.7)
MCV: 87 fL (ref 79–97)
Monocytes Absolute: 0.6 10*3/uL (ref 0.1–0.9)
Monocytes: 10 %
Neutrophils Absolute: 2 10*3/uL (ref 1.4–7.0)
Neutrophils: 35 %
Platelets: 276 10*3/uL (ref 150–450)
RBC: 3.94 x10E6/uL (ref 3.77–5.28)
RDW: 13.1 % (ref 11.7–15.4)
WBC: 5.7 10*3/uL (ref 3.4–10.8)

## 2019-06-24 LAB — NOVEL CORONAVIRUS, NAA (HOSP ORDER, SEND-OUT TO REF LAB; TAT 18-24 HRS): SARS-CoV-2, NAA: NOT DETECTED

## 2019-06-26 ENCOUNTER — Encounter (HOSPITAL_COMMUNITY): Payer: Self-pay | Admitting: Anesthesiology

## 2019-06-26 ENCOUNTER — Ambulatory Visit (HOSPITAL_COMMUNITY): Payer: Medicare Other

## 2019-06-26 ENCOUNTER — Ambulatory Visit (HOSPITAL_COMMUNITY)
Admission: RE | Admit: 2019-06-26 | Discharge: 2019-06-26 | Disposition: A | Discharge status: Home / Self Care | Payer: Medicare Other | Attending: Internal Medicine | Admitting: Internal Medicine

## 2019-06-26 ENCOUNTER — Encounter (HOSPITAL_COMMUNITY)
Admission: RE | Disposition: A | Discharge status: Home / Self Care | Payer: Self-pay | Source: Home / Self Care | Attending: Internal Medicine

## 2019-06-26 ENCOUNTER — Other Ambulatory Visit: Payer: Self-pay

## 2019-06-26 DIAGNOSIS — Z7982 Long term (current) use of aspirin: Secondary | ICD-10-CM | POA: Diagnosis not present

## 2019-06-26 DIAGNOSIS — Z7901 Long term (current) use of anticoagulants: Secondary | ICD-10-CM | POA: Insufficient documentation

## 2019-06-26 DIAGNOSIS — Z79899 Other long term (current) drug therapy: Secondary | ICD-10-CM | POA: Diagnosis not present

## 2019-06-26 DIAGNOSIS — R42 Dizziness and giddiness: Secondary | ICD-10-CM | POA: Insufficient documentation

## 2019-06-26 DIAGNOSIS — I480 Paroxysmal atrial fibrillation: Secondary | ICD-10-CM | POA: Insufficient documentation

## 2019-06-26 DIAGNOSIS — Z959 Presence of cardiac and vascular implant and graft, unspecified: Secondary | ICD-10-CM

## 2019-06-26 DIAGNOSIS — I441 Atrioventricular block, second degree: Secondary | ICD-10-CM

## 2019-06-26 DIAGNOSIS — I10 Essential (primary) hypertension: Secondary | ICD-10-CM | POA: Insufficient documentation

## 2019-06-26 DIAGNOSIS — E7800 Pure hypercholesterolemia, unspecified: Secondary | ICD-10-CM | POA: Insufficient documentation

## 2019-06-26 DIAGNOSIS — Z8249 Family history of ischemic heart disease and other diseases of the circulatory system: Secondary | ICD-10-CM | POA: Insufficient documentation

## 2019-06-26 HISTORY — PX: PACEMAKER IMPLANT: EP1218

## 2019-06-26 SURGERY — PACEMAKER IMPLANT

## 2019-06-26 MED ORDER — SODIUM CHLORIDE 0.9 % IV SOLN
INTRAVENOUS | Status: DC | PRN
  Administered 2019-06-26: 11:00:00

## 2019-06-26 MED ORDER — SODIUM CHLORIDE 0.9% FLUSH: 3.0000 mL | Freq: Two times a day (BID) | INTRAVENOUS | Status: DC

## 2019-06-26 MED ORDER — IOHEXOL 350 MG/ML SOLN
INTRAVENOUS | Status: DC | PRN
  Administered 2019-06-26: 10 mL

## 2019-06-26 MED ORDER — ACETAMINOPHEN 325 MG PO TABS: 325.0000 mg | ORAL_TABLET | ORAL | Status: DC | PRN

## 2019-06-26 MED ORDER — SODIUM CHLORIDE 0.9 % IV SOLN
INTRAVENOUS | Status: AC
  Filled 2019-06-26: qty 2

## 2019-06-26 MED ORDER — SODIUM CHLORIDE 0.9 % IV SOLN
INTRAVENOUS | Status: DC
  Administered 2019-06-26: 09:00:00 via INTRAVENOUS

## 2019-06-26 MED ORDER — SODIUM CHLORIDE 0.45 % IV SOLN
INTRAVENOUS | Status: DC
  Administered 2019-06-26: 10:00:00 via INTRAVENOUS

## 2019-06-26 MED ORDER — LIDOCAINE HCL (PF) 1 % IJ SOLN
INTRAMUSCULAR | Status: DC | PRN
  Administered 2019-06-26: 55 mL

## 2019-06-26 MED ORDER — HEPARIN (PORCINE) IN NACL 1000-0.9 UT/500ML-% IV SOLN
INTRAVENOUS | Status: AC
  Filled 2019-06-26: qty 500

## 2019-06-26 MED ORDER — SODIUM CHLORIDE 0.9 % IV SOLN: 250.0000 mL | INTRAVENOUS | Status: DC | PRN

## 2019-06-26 MED ORDER — HYDROCODONE-ACETAMINOPHEN 5-325 MG PO TABS: 1.0000 | ORAL_TABLET | ORAL | Status: DC | PRN

## 2019-06-26 MED ORDER — LIDOCAINE HCL (PF) 1 % IJ SOLN
INTRAMUSCULAR | Status: AC
  Filled 2019-06-26: qty 60

## 2019-06-26 MED ORDER — SODIUM CHLORIDE 0.9% FLUSH: 3.0000 mL | INTRAVENOUS | Status: DC | PRN

## 2019-06-26 MED ORDER — CEFAZOLIN SODIUM-DEXTROSE 2-4 GM/100ML-% IV SOLN
INTRAVENOUS | Status: AC
  Filled 2019-06-26: qty 100

## 2019-06-26 MED ORDER — CEFAZOLIN SODIUM-DEXTROSE 2-4 GM/100ML-% IV SOLN
2.0000 g | INTRAVENOUS | Status: AC
  Administered 2019-06-26: 10:00:00 2 g via INTRAVENOUS

## 2019-06-26 MED ORDER — HEPARIN (PORCINE) IN NACL 2-0.9 UNITS/ML
INTRAMUSCULAR | Status: AC | PRN
  Administered 2019-06-26: 500 mL

## 2019-06-26 MED ORDER — ONDANSETRON HCL 4 MG/2ML IJ SOLN: 4.0000 mg | Freq: Four times a day (QID) | INTRAMUSCULAR | Status: DC | PRN

## 2019-06-26 SURGICAL SUPPLY — 8 items
CABLE SURGICAL S-101-97-12 (CABLE) ×3 IMPLANT
IPG PACE AZUR XT DR MRI W1DR01 (Pacemaker) ×1 IMPLANT
LEAD CAPSURE NOVUS 45CM (Lead) ×3 IMPLANT
LEAD CAPSURE NOVUS 5076-58CM (Lead) ×3 IMPLANT
PACE AZURE XT DR MRI W1DR01 (Pacemaker) ×3 IMPLANT
PAD PRO RADIOLUCENT 2001M-C (PAD) ×3 IMPLANT
SHEATH 7FR PRELUDE SNAP 13 (SHEATH) ×6 IMPLANT
TRAY PACEMAKER INSERTION (PACKS) ×3 IMPLANT

## 2019-06-26 NOTE — Progress Notes (Signed)
Post PPM implant wound care and activity rest ructions were discussed with the patient and provided in her AVS I called her sone Coral Rais and discussed the instructions with him as well. Encouraged both to call with any questions or concerns  Tommye Standard, PA-C

## 2019-06-26 NOTE — Discharge Instructions (Signed)
Send a pacemaker transmission tomorrow 06/27/2019 Remove arm sling tomorrow 06/27/2019 Resume eliquis on 06/29/2019        Supplemental Discharge Instructions for  Pacemaker Patients  Activity No heavy lifting or vigorous activity with your left arm for 6 to 8 weeks.  Do not raise your left arm above your head for one week.  Gradually raise your affected arm as drawn below.             06/30/2019               07/01/2019              07/02/2019            07/03/2019 __  NO DRIVING  (the patient does not drive)  WOUND CARE - Keep the wound area clean and dry.  Do not get this area wet, no showers until cleared to at your wound check visit - Remove the arm sling tomorrow 06/27/2019 - Remove the outer plastic bandage tomorrow, 06/27/2019.  The steri strips (paper tapes) underneath STAY, do not remove them. - The tape/steri-strips on your wound will fall off; do not pull them off.  No bandage is needed on the site.  DO  NOT apply any creams, oils, or ointments to the wound area. - If you notice any drainage or discharge from the wound, any swelling or bruising at the site, or you develop a fever > 101? F after you are discharged home, call the office at once.  Special Instructions - You are still able to use cellular telephones; use the ear opposite the side where you have your pacemaker/defibrillator.  Avoid carrying your cellular phone near your device. - When traveling through airports, show security personnel your identification card to avoid being screened in the metal detectors.  Ask the security personnel to use the hand wand. - Avoid arc welding equipment, MRI testing (magnetic resonance imaging), TENS units (transcutaneous nerve stimulators).  Call the office for questions about other devices. - Avoid electrical appliances that are in poor condition or are not properly grounded. - Microwave ovens are safe to be near or to operate.       Pacemaker Implantation, Care  After This sheet gives you information about how to care for yourself after your procedure. Your health care provider may also give you more specific instructions. If you have problems or questions, contact your health care provider. What can I expect after the procedure? After the procedure, it is common to have:  Mild pain.  Slight bruising.  Some swelling over the incision.  A slight bump over the skin where the device was placed. Sometimes, it is possible to feel the device under the skin. This is normal.  You should received your Pacemaker ID card within 4-8 weeks. Follow these instructions at home: Medicines  Take over-the-counter and prescription medicines only as told by your health care provider.  If you were prescribed an antibiotic medicine, take it as told by your health care provider. Do not stop taking the antibiotic even if you start to feel better. Wound care     Do not remove the bandage on your chest until directed to do so by your health care provider.  After your bandage is removed, you may see pieces of tape called skin adhesive strips over the area where the cut was made (incision site). Let them fall off on their own.  Check the incision site every day to make sure it is not  infected, bleeding, or starting to pull apart.  Do not use lotions or ointments near the incision site unless directed to do so.  Keep the incision area clean and dry for 7 days after the procedure or as directed by your health care provider. It takes several weeks for the incision site to completely heal.  Do not take baths, swim, or use a hot tub for 7-10 days or as otherwise directed by your health care provider. Activity  Do not drive or use heavy machinery while taking prescription pain medicine.  Do not drive for 24 hours if you were given a medicine to help you relax (sedative).  Check with your health care provider before you start to drive or play sports.  Avoid sudden  jerking, pulling, or chopping movements that pull your upper arm far away from your body. Avoid these movements for at least 6 weeks or as long as told by your health care provider.  Do not lift your upper arm above your shoulders for at least 6 weeks or as long as told by your health care provider. This means no tennis, golf, or swimming.  You may go back to work when your health care provider says it is okay. Pacemaker care  You may be shown how to transfer data from your pacemaker through the phone to your health care provider.  Always let all health care providers know about your pacemaker before you have any medical procedures or tests.  Wear a medical ID bracelet or necklace stating that you have a pacemaker. Carry a pacemaker ID card with you at all times.  Your pacemaker battery will last for 5-15 years. Routine checks by your health care provider will let the health care provider know when the battery is starting to run down. The pacemaker will need to be replaced when the battery starts to run down.  Do not use amateur Chief of Staff. Other electrical devices are safe to use, including power tools, lawn mowers, and speakers. If you are unsure of whether something is safe to use, ask your health care provider.  When using your cell phone, hold it to the ear opposite the pacemaker. Do not leave your cell phone in a pocket over the pacemaker.  Avoid places or objects that have a strong electric or magnetic field, including: ? Airport Herbalist. When at the airport, let officials know that you have a pacemaker. ? Power plants. ? Large electrical generators. ? Radiofrequency transmission towers, such as cell phone and radio towers. General instructions  Weigh yourself every day. If you suddenly gain weight, fluid may be building up in your body.  Keep all follow-up visits as told by your health care provider. This is important. Contact a health care  provider if:  You gain weight suddenly.  Your legs or feet swell.  It feels like your heart is fluttering or skipping beats (heart palpitations).  You have chills or a fever.  You have more redness, swelling, or pain around your incisions.  You have more fluid or blood coming from your incisions.  Your incisions feel warm to the touch.  You have pus or a bad smell coming from your incisions. Get help right away if:  You have chest pain.  You have trouble breathing or are short of breath.  You become extremely tired.  You are light-headed or you faint. This information is not intended to replace advice given to you by your health care provider. Make  sure you discuss any questions you have with your health care provider.  

## 2019-06-26 NOTE — Interval H&P Note (Signed)
History and Physical Interval Note:  06/26/2019 10:02 AM  Lori Wilson  has presented today for surgery, with the diagnosis of bradycardia.  The various methods of treatment have been discussed with the patient and family. After consideration of risks, benefits and other options for treatment, the patient has consented to transvenous  pacemaker implantation as a surgical intervention.  (she is aware that insurance has declined leadless pacing as an option for her).  The patient's history has been reviewed, patient examined, no change in status, stable for surgery.  I have reviewed the patient's chart and labs.  Questions were answered to the patient's satisfaction.     Thompson Grayer

## 2019-06-30 ENCOUNTER — Other Ambulatory Visit (HOSPITAL_COMMUNITY): Payer: Medicare Other

## 2019-06-30 ENCOUNTER — Other Ambulatory Visit: Payer: Self-pay | Admitting: Internal Medicine

## 2019-06-30 DIAGNOSIS — I10 Essential (primary) hypertension: Secondary | ICD-10-CM

## 2019-07-09 ENCOUNTER — Ambulatory Visit (INDEPENDENT_AMBULATORY_CARE_PROVIDER_SITE_OTHER): Payer: Medicare Other | Admitting: Student

## 2019-07-09 ENCOUNTER — Other Ambulatory Visit: Payer: Self-pay

## 2019-07-09 DIAGNOSIS — T148XXA Other injury of unspecified body region, initial encounter: Secondary | ICD-10-CM

## 2019-07-09 LAB — CUP PACEART INCLINIC DEVICE CHECK
Battery Remaining Longevity: 174 mo
Battery Voltage: 3.22 V
Brady Statistic AP VP Percent: 3.16 %
Brady Statistic AP VS Percent: 0.08 %
Brady Statistic AS VP Percent: 18.31 %
Brady Statistic AS VS Percent: 78.45 %
Brady Statistic RA Percent Paced: 4.16 %
Brady Statistic RV Percent Paced: 20.92 %
Date Time Interrogation Session: 20201224112206
Implantable Lead Implant Date: 20201211
Implantable Lead Implant Date: 20201211
Implantable Lead Location: 753859
Implantable Lead Location: 753860
Implantable Lead Model: 5076
Implantable Lead Model: 5076
Implantable Pulse Generator Implant Date: 20201211
Lead Channel Impedance Value: 323 Ohm
Lead Channel Impedance Value: 380 Ohm
Lead Channel Impedance Value: 399 Ohm
Lead Channel Impedance Value: 513 Ohm
Lead Channel Pacing Threshold Amplitude: 0.875 V
Lead Channel Pacing Threshold Pulse Width: 0.4 ms
Lead Channel Sensing Intrinsic Amplitude: 3.875 mV
Lead Channel Sensing Intrinsic Amplitude: 4 mV
Lead Channel Sensing Intrinsic Amplitude: 4.5 mV
Lead Channel Sensing Intrinsic Amplitude: 8.5 mV
Lead Channel Setting Pacing Amplitude: 3.5 V
Lead Channel Setting Pacing Amplitude: 3.5 V
Lead Channel Setting Pacing Pulse Width: 0.4 ms
Lead Channel Setting Sensing Sensitivity: 1.2 mV

## 2019-07-09 MED ORDER — CEPHALEXIN 500 MG PO CAPS: 500.0000 mg | ORAL_CAPSULE | Freq: Two times a day (BID) | ORAL | 0 refills | Status: DC

## 2019-07-09 NOTE — Progress Notes (Signed)
Wound check appointment. Steri-strips removed. Wound without redness or edema. Clear drainage from superior medial corner, where it appears an internal suture popped.  No tract to the pacemaker pocket.  Reviewed with Dr. Rayann Heman, area cleaned, steri-strips placed, and 1 week follow up for re-check. Keflex x 7 days. Normal device function. Thresholds, sensing, and impedances consistent with implant measurements. Device programmed at 3.5V/auto capture programmed on for extra safety margin until 3 month visit. Histogram distribution appropriate for patient and level of activity. 3% AF known. Pt on Eliquis. Pt in AF on presentation, and NSR by the end of interrogation. Patient educated about wound care, arm mobility, lifting restrictions. ROV 1 week for re-check wound.

## 2019-07-16 ENCOUNTER — Other Ambulatory Visit: Payer: Self-pay

## 2019-07-16 ENCOUNTER — Ambulatory Visit (INDEPENDENT_AMBULATORY_CARE_PROVIDER_SITE_OTHER): Payer: Medicare Other | Admitting: Student

## 2019-07-16 DIAGNOSIS — T148XXA Other injury of unspecified body region, initial encounter: Secondary | ICD-10-CM

## 2019-07-16 NOTE — Progress Notes (Signed)
In clinic wound re-check.  Edges now well approximated and healed. No drainage, bleeding, edema, or hematoma.  Pt to complete 1 week course of Keflex.  No further at this time. Pt knows to let us know if any change.

## 2019-08-12 DIAGNOSIS — H40052 Ocular hypertension, left eye: Secondary | ICD-10-CM | POA: Diagnosis not present

## 2019-08-12 DIAGNOSIS — H402211 Chronic angle-closure glaucoma, right eye, mild stage: Secondary | ICD-10-CM | POA: Diagnosis not present

## 2019-08-12 DIAGNOSIS — H04123 Dry eye syndrome of bilateral lacrimal glands: Secondary | ICD-10-CM | POA: Diagnosis not present

## 2019-08-12 DIAGNOSIS — H402223 Chronic angle-closure glaucoma, left eye, severe stage: Secondary | ICD-10-CM | POA: Diagnosis not present

## 2019-08-19 ENCOUNTER — Other Ambulatory Visit: Payer: Self-pay | Admitting: Internal Medicine

## 2019-08-19 DIAGNOSIS — Z1231 Encounter for screening mammogram for malignant neoplasm of breast: Secondary | ICD-10-CM

## 2019-08-27 ENCOUNTER — Other Ambulatory Visit: Payer: Self-pay

## 2019-08-28 ENCOUNTER — Ambulatory Visit (INDEPENDENT_AMBULATORY_CARE_PROVIDER_SITE_OTHER): Payer: Medicare Other | Admitting: Internal Medicine

## 2019-08-28 ENCOUNTER — Encounter: Payer: Self-pay | Admitting: Internal Medicine

## 2019-08-28 VITALS — BP 118/82 | HR 74 | Temp 97.4°F | Wt 166.9 lb

## 2019-08-28 DIAGNOSIS — I441 Atrioventricular block, second degree: Secondary | ICD-10-CM

## 2019-08-28 DIAGNOSIS — I10 Essential (primary) hypertension: Secondary | ICD-10-CM | POA: Diagnosis not present

## 2019-08-28 DIAGNOSIS — E7800 Pure hypercholesterolemia, unspecified: Secondary | ICD-10-CM | POA: Diagnosis not present

## 2019-08-28 NOTE — Patient Instructions (Signed)
-  Nice seeing you today!!  -Schedule your wellness visit with me in 3 months.

## 2019-08-28 NOTE — Progress Notes (Signed)
Established Patient Office Visit     This visit occurred during the SARS-CoV-2 public health emergency.  Safety protocols were in place, including screening questions prior to the visit, additional usage of staff PPE, and extensive cleaning of exam room while observing appropriate contact time as indicated for disinfecting solutions.    CC/Reason for Visit: Follow-up chronic conditions  HPI: Lori Wilson is a 85 y.o. female who is coming in today for the above mentioned reasons. Past Medical History is significant for: Hypertension, hyperlipidemia and gout.  I have not seen her since March of last year.  At that time she had been complaining of palpitations so I had set her up with a heart monitor that subsequently showed a second-degree heart block.  Since then she has had a pacemaker placed in December and has been followed closely by cardiology and electrophysiology.  She is doing well today and has no acute complaints.   Past Medical/Surgical History: Past Medical History:  Diagnosis Date  . Dizziness 08/12/2016  . Giant cell arteritis ([place]) 08/13/2016  . High cholesterol   . Hypertension   . Pneumonia   . Second degree Mobitz II AV block   . Tremor 02/24/2014  . Vertigo 08/12/2016    Past Surgical History:  Procedure Laterality Date  . BLADDER SURGERY    . catract Left 04/06/2014  . PACEMAKER IMPLANT N/A 06/26/2019   Procedure: PACEMAKER IMPLANT;  Surgeon: Thompson Grayer, MD;  Location: [facility];  Service: Cardiovascular;  Laterality: N/A;  . VAGINAL HYSTERECTOMY      Social History:  reports that she has never smoked. She has never used smokeless tobacco. She reports that she does not drink alcohol or use drugs.  Allergies: No Known Allergies  Family History:  Family History  Problem Relation Age of Onset  . Heart attack Mother   . Diabetes Mother   . Stroke Father   . High blood pressure Father      Current Outpatient Medications:  .   acetaminophen (TYLENOL) 325 MG tablet, Take 650 mg by mouth every 6 (six) hours as needed for mild pain., Disp: , Rfl:  .  allopurinol (ZYLOPRIM) 100 MG tablet, TAKE 1 TABLET(100 MG) BY MOUTH DAILY (Patient taking differently: Take 100 mg by mouth daily. ), Disp: 90 tablet, Rfl: 1 .  amLODipine (NORVASC) 5 MG tablet, TAKE 1 TABLET(5 MG) BY MOUTH DAILY, Disp: 90 tablet, Rfl: 1 .  apixaban (ELIQUIS) 5 MG TABS tablet, Take 1 tablet (5 mg total) by mouth 2 (two) times daily., Disp: 60 tablet, Rfl: 11 .  aspirin EC 81 MG tablet, Take 81 mg by mouth daily., Disp: , Rfl:  .  dorzolamide (TRUSOPT) 2 % ophthalmic solution, Place 1 drop into the left eye 2 (two) times daily. , Disp: , Rfl:  .  furosemide (LASIX) 20 MG tablet, TAKE 1/2 TABLET(10 MG) BY MOUTH DAILY (Patient taking differently: Take 10 mg by mouth daily. ), Disp: 45 tablet, Rfl: 1 .  omeprazole (PRILOSEC) 20 MG capsule, Take 20 mg by mouth every other day., Disp: , Rfl:  .  potassium chloride (K-DUR) 10 MEQ tablet, TAKE 1 TABLET(10 MEQ) BY MOUTH DAILY (Patient taking differently: Take 10 mEq by mouth daily. TAKE 1 TABLET(10 MEQ) BY MOUTH DAILY), Disp: 90 tablet, Rfl: 1 .  cephALEXin (KEFLEX) 500 MG capsule, Take 1 capsule (500 mg total) by mouth 2 (two) times daily. (Patient not taking: Reported on 08/28/2019), Disp: 14 capsule, Rfl: 0 .  metoprolol tartrate (LOPRESSOR) 25 MG tablet, Take 1 tablet (25 mg total) by mouth 2 (two) times daily. (Patient not taking: Reported on 06/22/2019), Disp: 60 tablet, Rfl: 11  Review of Systems:  Constitutional: Denies fever, chills, diaphoresis, appetite change and fatigue.  HEENT: Denies photophobia, eye pain, redness, hearing loss, ear pain, congestion, sore throat, rhinorrhea, sneezing, mouth sores, trouble swallowing, neck pain, neck stiffness and tinnitus.   Respiratory: Denies SOB, DOE, cough, chest tightness,  and wheezing.   Cardiovascular: Denies chest pain, palpitations and leg swelling.    Gastrointestinal: Denies nausea, vomiting, abdominal pain, diarrhea, constipation, blood in stool and abdominal distention.  Genitourinary: Denies dysuria, urgency, frequency, hematuria, flank pain and difficulty urinating.  Endocrine: Denies: hot or cold intolerance, sweats, changes in hair or nails, polyuria, polydipsia. Musculoskeletal: Denies myalgias, back pain, joint swelling, arthralgias and gait problem.  Skin: Denies pallor, rash and wound.  Neurological: Denies dizziness, seizures, syncope, weakness, light-headedness, numbness and headaches.  Hematological: Denies adenopathy. Easy bruising, personal or family bleeding history  Psychiatric/Behavioral: Denies suicidal ideation, mood changes, confusion, nervousness, sleep disturbance and agitation    Physical Exam: Vitals:   08/28/19 0900  BP: 118/82  Pulse: 74  Temp: (!) 97.4 F (36.3 C)  TempSrc: Temporal  SpO2: 100%  Weight: 166 lb 14.4 oz (75.7 kg)    Body mass index is 26.94 kg/m.   Constitutional: NAD, calm, comfortable Eyes: PERRL, lids and conjunctivae normal ENMT: Mucous membranes are moist.  Respiratory: clear to auscultation bilaterally, no wheezing, no crackles. Normal respiratory effort. No accessory muscle use.  Cardiovascular: Regular rate and rhythm, no murmurs / rubs / gallops. No extremity edema.  Neurologic: Grossly intact and nonfocal Psychiatric: Normal judgment and insight. Alert and oriented x 3. Normal mood.    Impression and Plan:  Second degree AV block -Status post permanent pacemaker, followed by cardiology.  Essential hypertension -Well-controlled on current regimen.  High cholesterol -Last LDL was 122 in 2018, she needs lipids checked when she returns for her CPE in 3 months.    Patient Instructions  -Nice seeing you today!!  -Schedule your wellness visit with me in 3 months.     Lelon Frohlich, MD [facility] at [facility]

## 2019-09-21 ENCOUNTER — Telehealth: Payer: Self-pay | Admitting: Internal Medicine

## 2019-09-21 NOTE — Telephone Encounter (Signed)
Patient states she is inquiring about whether or not Dr. Rayann Heman would like for her to come in for her PHYS PACER CK appointment scheduled for 09/28/19 due to  having redness at the site of the incision. Please advise.

## 2019-09-21 NOTE — Telephone Encounter (Signed)
Spoke with pt. She is questioning if she she be seen sooner that 3/15, over the weekend she picked up her pocketbook wrong and is concerned she Lori Wilson have done soimething to her implanted device (leadless).  The implant site is red, not swollen. Assisted pt son with sending remote transmission, confirmed receipt of report.  Advised will have report reviewed and if any concerns we can call back otherwise keep appt on 3/15.  Can use ice pack on red area if needed for pain or swelling.

## 2019-09-21 NOTE — Telephone Encounter (Signed)
Device transmission reviewed, not leadless device.  No concerns.

## 2019-09-25 ENCOUNTER — Ambulatory Visit (INDEPENDENT_AMBULATORY_CARE_PROVIDER_SITE_OTHER): Payer: Medicare Other | Admitting: *Deleted

## 2019-09-25 DIAGNOSIS — Z950 Presence of cardiac pacemaker: Secondary | ICD-10-CM

## 2019-09-25 LAB — CUP PACEART REMOTE DEVICE CHECK
Battery Remaining Longevity: 155 mo
Battery Voltage: 3.2 V
Brady Statistic AP VP Percent: 5.35 %
Brady Statistic AP VS Percent: 0.12 %
Brady Statistic AS VP Percent: 28.17 %
Brady Statistic AS VS Percent: 66.37 %
Brady Statistic RA Percent Paced: 7.37 %
Brady Statistic RV Percent Paced: 32.48 %
Date Time Interrogation Session: 20210311180730
Implantable Lead Implant Date: 20201211
Implantable Lead Implant Date: 20201211
Implantable Lead Location: 753859
Implantable Lead Location: 753860
Implantable Lead Model: 5076
Implantable Lead Model: 5076
Implantable Pulse Generator Implant Date: 20201211
Lead Channel Impedance Value: 304 Ohm
Lead Channel Impedance Value: 380 Ohm
Lead Channel Impedance Value: 456 Ohm
Lead Channel Impedance Value: 475 Ohm
Lead Channel Pacing Threshold Amplitude: 0.625 V
Lead Channel Pacing Threshold Pulse Width: 0.4 ms
Lead Channel Sensing Intrinsic Amplitude: 3.5 mV
Lead Channel Sensing Intrinsic Amplitude: 3.5 mV
Lead Channel Sensing Intrinsic Amplitude: 4.75 mV
Lead Channel Sensing Intrinsic Amplitude: 4.75 mV
Lead Channel Setting Pacing Amplitude: 3.5 V
Lead Channel Setting Pacing Amplitude: 3.5 V
Lead Channel Setting Pacing Pulse Width: 0.4 ms
Lead Channel Setting Sensing Sensitivity: 1.2 mV

## 2019-09-25 NOTE — Progress Notes (Signed)
PPM Remote  

## 2019-09-28 ENCOUNTER — Telehealth (INDEPENDENT_AMBULATORY_CARE_PROVIDER_SITE_OTHER): Payer: Medicare Other | Admitting: Internal Medicine

## 2019-09-28 ENCOUNTER — Other Ambulatory Visit: Payer: Self-pay

## 2019-09-28 ENCOUNTER — Encounter: Payer: Self-pay | Admitting: Internal Medicine

## 2019-09-28 VITALS — BP 124/73 | HR 64

## 2019-09-28 DIAGNOSIS — I10 Essential (primary) hypertension: Secondary | ICD-10-CM | POA: Diagnosis not present

## 2019-09-28 DIAGNOSIS — Z950 Presence of cardiac pacemaker: Secondary | ICD-10-CM

## 2019-09-28 DIAGNOSIS — I480 Paroxysmal atrial fibrillation: Secondary | ICD-10-CM | POA: Diagnosis not present

## 2019-09-28 DIAGNOSIS — I441 Atrioventricular block, second degree: Secondary | ICD-10-CM

## 2019-09-28 NOTE — Progress Notes (Signed)
Electrophysiology TeleHealth Note  Due to national recommendations of social distancing due to [place] 19, an audio telehealth visit is felt to be most appropriate for this patient at this time.  Verbal consent was obtained by me for the telehealth visit today.  The patient does not have capability for a virtual visit.  A phone visit is therefore required today.   Date:  09/28/2019   ID:  Lori Wilson, DOB 05-08-1935, MRN [id number]  Location: patient's home  Provider location:  [place]  Evaluation Performed: Follow-up visit  PCP:  Isaac Bliss, Rayford Halsted, MD   Electrophysiologist:  Dr Rayann Heman  Chief Complaint:  palpitations  History of Present Illness:    Lori Wilson is a 85 y.o. female who presents via telehealth conferencing today.  Since her pacemaker implant, the patient reports doing very well.  Today, she denies symptoms of palpitations, exertional chest pain, shortness of breath,  lower extremity edema, dizziness, presyncope, or syncope.  She notices that she is hot in the evenings.  Denies fevers or chills.  The patient is otherwise without complaint today.  The patient denies symptoms of fevers, chills, cough, or new SOB worrisome for COVID 19.  Past Medical History:  Diagnosis Date  . Dizziness 08/12/2016  . Giant cell arteritis ([place]) 08/13/2016  . High cholesterol   . Hypertension   . Pneumonia   . Second degree Mobitz II AV block   . Tremor 02/24/2014  . Vertigo 08/12/2016    Past Surgical History:  Procedure Laterality Date  . BLADDER SURGERY    . catract Left 04/06/2014  . PACEMAKER IMPLANT N/A 06/26/2019   Procedure: PACEMAKER IMPLANT;  Surgeon: Thompson Grayer, MD;  Location: [facility];  Service: Cardiovascular;  Laterality: N/A;  . VAGINAL HYSTERECTOMY      Current Outpatient Medications  Medication Sig Dispense Refill  . acetaminophen (TYLENOL) 325 MG tablet Take 650 mg by mouth every 6 (six) hours as needed for mild pain.    Marland Kitchen  allopurinol (ZYLOPRIM) 100 MG tablet TAKE 1 TABLET(100 MG) BY MOUTH DAILY (Patient taking differently: Take 100 mg by mouth daily. ) 90 tablet 1  . amLODipine (NORVASC) 5 MG tablet TAKE 1 TABLET(5 MG) BY MOUTH DAILY 90 tablet 1  . apixaban (ELIQUIS) 5 MG TABS tablet Take 1 tablet (5 mg total) by mouth 2 (two) times daily. 60 tablet 11  . aspirin EC 81 MG tablet Take 81 mg by mouth daily.    . cephALEXin (KEFLEX) 500 MG capsule Take 1 capsule (500 mg total) by mouth 2 (two) times daily. (Patient not taking: Reported on 08/28/2019) 14 capsule 0  . dorzolamide (TRUSOPT) 2 % ophthalmic solution Place 1 drop into the left eye 2 (two) times daily.     . furosemide (LASIX) 20 MG tablet TAKE 1/2 TABLET(10 MG) BY MOUTH DAILY (Patient taking differently: Take 10 mg by mouth daily. ) 45 tablet 1  . metoprolol tartrate (LOPRESSOR) 25 MG tablet Take 1 tablet (25 mg total) by mouth 2 (two) times daily. (Patient not taking: Reported on 06/22/2019) 60 tablet 11  . omeprazole (PRILOSEC) 20 MG capsule Take 20 mg by mouth every other day.    . potassium chloride (K-DUR) 10 MEQ tablet TAKE 1 TABLET(10 MEQ) BY MOUTH DAILY (Patient taking differently: Take 10 mEq by mouth daily. TAKE 1 TABLET(10 MEQ) BY MOUTH DAILY) 90 tablet 1   No current facility-administered medications for this visit.    Allergies:  Patient has no known allergies.   Social History:  The patient  reports that she has never smoked. She has never used smokeless tobacco. She reports that she does not drink alcohol or use drugs.   Family History:  The patient's family history includes Diabetes in her mother; Heart attack in her mother; High blood pressure in her father; Stroke in her father.   ROS:  Please see the history of present illness.   All other systems are personally reviewed and negative.    Exam:    Vital Signs:  BP 124/73   Pulse 64   Well sounding, alert and conversant   Labs/Other Tests and Data Reviewed:    Recent  Labs: 03/06/2019: ALT 25 06/23/2019: BUN 20; Creatinine, Ser 1.28; Hemoglobin 11.6; Platelets 276; Potassium 4.2; Sodium 138   Wt Readings from Last 3 Encounters:  08/28/19 166 lb 14.4 oz (75.7 kg)  06/26/19 167 lb (75.8 kg)  06/04/19 168 lb (76.2 kg)     Last device remote is reviewed from [place] PDF which reveals normal device function, afib burden is 3.4%   ASSESSMENT & PLAN:    1.  Second degree AV block Normal pacemaker function Remotes are up to date No changes today Will have her come back in in 3 months to make sure afib is well controlled and that outputs are at chronic settings.  2. Paroxysmal atrial fibrillation She is on eliquis for stroke prevention  3. HTN Stable No change required today   Follow-up:  EP APP in 3 months   Patient Risk:  after full review of this patients clinical status, I feel that they are at moderate risk at this time.  Today, I have spent 15 minutes with the patient with telehealth technology discussing arrhythmia management .    Army Fossa, MD  09/28/2019 11:33 AM     [facility] [place]  [phone] (office) [phone] (fax)

## 2019-10-05 ENCOUNTER — Other Ambulatory Visit: Payer: Self-pay | Admitting: Internal Medicine

## 2019-10-05 DIAGNOSIS — I10 Essential (primary) hypertension: Secondary | ICD-10-CM

## 2019-10-07 ENCOUNTER — Other Ambulatory Visit: Payer: Self-pay

## 2019-10-07 ENCOUNTER — Ambulatory Visit
Admission: RE | Admit: 2019-10-07 | Discharge: 2019-10-07 | Disposition: A | Discharge status: Home / Self Care | Payer: Medicare Other | Source: Ambulatory Visit | Attending: Internal Medicine | Admitting: Internal Medicine

## 2019-10-07 DIAGNOSIS — Z1231 Encounter for screening mammogram for malignant neoplasm of breast: Secondary | ICD-10-CM

## 2019-11-25 ENCOUNTER — Ambulatory Visit: Payer: Medicare Other | Admitting: Internal Medicine

## 2019-11-25 ENCOUNTER — Other Ambulatory Visit: Payer: Self-pay

## 2019-11-26 ENCOUNTER — Ambulatory Visit (INDEPENDENT_AMBULATORY_CARE_PROVIDER_SITE_OTHER): Payer: Medicare Other | Admitting: Internal Medicine

## 2019-11-26 ENCOUNTER — Encounter: Payer: Self-pay | Admitting: Internal Medicine

## 2019-11-26 VITALS — BP 110/80 | HR 78 | Temp 97.3°F | Wt 166.4 lb

## 2019-11-26 DIAGNOSIS — I10 Essential (primary) hypertension: Secondary | ICD-10-CM

## 2019-11-26 DIAGNOSIS — M1A9XX0 Chronic gout, unspecified, without tophus (tophi): Secondary | ICD-10-CM | POA: Diagnosis not present

## 2019-11-26 DIAGNOSIS — I441 Atrioventricular block, second degree: Secondary | ICD-10-CM

## 2019-11-26 DIAGNOSIS — E7800 Pure hypercholesterolemia, unspecified: Secondary | ICD-10-CM

## 2019-11-26 NOTE — Patient Instructions (Signed)
-  Nice seeing you today!!  -Schedule follow up in 3 months for your physical. Please come in fasting that day. 

## 2019-11-26 NOTE — Progress Notes (Signed)
Established Patient Office Visit     This visit occurred during the SARS-CoV-2 public health emergency.  Safety protocols were in place, including screening questions prior to the visit, additional usage of staff PPE, and extensive cleaning of exam room while observing appropriate contact time as indicated for disinfecting solutions.    CC/Reason for Visit: Follow-up chronic medical conditions  HPI: Lori Wilson is a 85 y.o. female who is coming in today for the above mentioned reasons. Past Medical History is significant for: Hypertension, hyperlipidemia and gout.  She also have a pacemaker placed in December for second-degree heart block after monitor was placed for palpitations.  She has been doing well, she has no complaints today.  She has received both Covid vaccines.  She is quite happy that her weight has maintained stable.   Past Medical/Surgical History: Past Medical History:  Diagnosis Date  . Dizziness 08/12/2016  . Giant cell arteritis ([place]) 08/13/2016  . High cholesterol   . Hypertension   . Pneumonia   . Second degree Mobitz II AV block   . Tremor 02/24/2014  . Vertigo 08/12/2016    Past Surgical History:  Procedure Laterality Date  . BLADDER SURGERY    . catract Left 04/06/2014  . PACEMAKER IMPLANT N/A 06/26/2019   Procedure: PACEMAKER IMPLANT;  Surgeon: Thompson Grayer, MD;  Location: [facility];  Service: Cardiovascular;  Laterality: N/A;  . VAGINAL HYSTERECTOMY      Social History:  reports that she has never smoked. She has never used smokeless tobacco. She reports that she does not drink alcohol or use drugs.  Allergies: No Known Allergies  Family History:  Family History  Problem Relation Age of Onset  . Heart attack Mother   . Diabetes Mother   . Stroke Father   . High blood pressure Father      Current Outpatient Medications:  .  acetaminophen (TYLENOL) 325 MG tablet, Take 650 mg by mouth every 6 (six) hours as needed for mild  pain., Disp: , Rfl:  .  allopurinol (ZYLOPRIM) 100 MG tablet, TAKE 1 TABLET(100 MG) BY MOUTH DAILY (Patient taking differently: Take 100 mg by mouth daily. ), Disp: 90 tablet, Rfl: 1 .  amLODipine (NORVASC) 5 MG tablet, TAKE 1 TABLET(5 MG) BY MOUTH DAILY, Disp: 90 tablet, Rfl: 1 .  apixaban (ELIQUIS) 5 MG TABS tablet, Take 1 tablet (5 mg total) by mouth 2 (two) times daily., Disp: 60 tablet, Rfl: 11 .  dorzolamide (TRUSOPT) 2 % ophthalmic solution, Place 1 drop into the left eye 2 (two) times daily. , Disp: , Rfl:  .  furosemide (LASIX) 20 MG tablet, TAKE 1/2 TABLET(10 MG) BY MOUTH DAILY, Disp: 45 tablet, Rfl: 1 .  meclizine (ANTIVERT) 12.5 MG tablet, Take 12.5 mg by mouth 3 (three) times daily as needed for dizziness., Disp: , Rfl:  .  omeprazole (PRILOSEC) 20 MG capsule, Take 20 mg by mouth every other day., Disp: , Rfl:  .  potassium chloride (KLOR-CON) 10 MEQ tablet, TAKE 1 TABLET(10 MEQ) BY MOUTH DAILY, Disp: 90 tablet, Rfl: 1  Review of Systems:  Constitutional: Denies fever, chills, diaphoresis, appetite change and fatigue.  HEENT: Denies photophobia, eye pain, redness, hearing loss, ear pain, congestion, sore throat, rhinorrhea, sneezing, mouth sores, trouble swallowing, neck pain, neck stiffness and tinnitus.   Respiratory: Denies SOB, DOE, cough, chest tightness,  and wheezing.   Cardiovascular: Denies chest pain, palpitations and leg swelling.  Gastrointestinal: Denies nausea, vomiting, abdominal pain,  diarrhea, constipation, blood in stool and abdominal distention.  Genitourinary: Denies dysuria, urgency, frequency, hematuria, flank pain and difficulty urinating.  Endocrine: Denies: hot or cold intolerance, sweats, changes in hair or nails, polyuria, polydipsia. Musculoskeletal: Denies myalgias, back pain, joint swelling, arthralgias and gait problem.  Skin: Denies pallor, rash and wound.  Neurological: Denies dizziness, seizures, syncope, weakness, light-headedness, numbness and  headaches.  Hematological: Denies adenopathy. Easy bruising, personal or family bleeding history  Psychiatric/Behavioral: Denies suicidal ideation, mood changes, confusion, nervousness, sleep disturbance and agitation    Physical Exam: Vitals:   11/26/19 1012  BP: 110/80  Pulse: 78  Temp: (!) 97.3 F (36.3 C)  TempSrc: Temporal  SpO2: 99%  Weight: 166 lb 6.4 oz (75.5 kg)    Body mass index is 26.86 kg/m.   Constitutional: NAD, calm, comfortable Eyes: PERRL, lids and conjunctivae normal ENMT: Mucous membranes are moist.  Respiratory: clear to auscultation bilaterally, no wheezing, no crackles. Normal respiratory effort. No accessory muscle use.  Cardiovascular: Regular rate and rhythm, no murmurs / rubs / gallops. No extremity edema. 2+ pedal pulses.   Neurologic: Grossly intact and nonfocal Psychiatric: Normal judgment and insight. Alert and oriented x 3. Normal mood.    Impression and Plan:  Essential hypertension -Well-controlled continue current medication.  High cholesterol -Last LDL was 120 08/24/2016. -Check lipids which returns for physical and lipids.  Second degree AV block -Status post pacemaker, followed by electrophysiology cardiology.  Chronic gout without tophus, unspecified cause, unspecified site -No recent flareups.   Patient Instructions  -Nice seeing you today!!  -Schedule follow up in 3 months for your physical. Please come in fasting that day.     Lelon Frohlich, MD [facility] at [facility]

## 2019-12-09 ENCOUNTER — Other Ambulatory Visit: Payer: Self-pay | Admitting: Cardiovascular Disease

## 2019-12-09 NOTE — Telephone Encounter (Signed)
Eliquis 5mg  refill request received. Patient is 85 years old, weight-75.5kg, Crea-1.28 on 06/23/2019, Diagnosis-Afib, and last seen by Dr. Rayann Heman on 09/28/2019. Dose is appropriate based on dosing criteria. Will send in refill to requested pharmacy.

## 2019-12-23 DIAGNOSIS — M545 Low back pain: Secondary | ICD-10-CM | POA: Diagnosis not present

## 2019-12-25 ENCOUNTER — Ambulatory Visit (INDEPENDENT_AMBULATORY_CARE_PROVIDER_SITE_OTHER): Payer: Medicare Other | Admitting: *Deleted

## 2019-12-25 DIAGNOSIS — I441 Atrioventricular block, second degree: Secondary | ICD-10-CM | POA: Diagnosis not present

## 2019-12-25 LAB — CUP PACEART REMOTE DEVICE CHECK
Battery Remaining Longevity: 160 mo
Battery Voltage: 3.18 V
Brady Statistic AP VP Percent: 6.76 %
Brady Statistic AP VS Percent: 0.15 %
Brady Statistic AS VP Percent: 31.87 %
Brady Statistic AS VS Percent: 61.23 %
Brady Statistic RA Percent Paced: 9.34 %
Brady Statistic RV Percent Paced: 37.72 %
Date Time Interrogation Session: 20210611070205
Implantable Lead Implant Date: 20201211
Implantable Lead Implant Date: 20201211
Implantable Lead Location: 753859
Implantable Lead Location: 753860
Implantable Lead Model: 5076
Implantable Lead Model: 5076
Implantable Pulse Generator Implant Date: 20201211
Lead Channel Impedance Value: 323 Ohm
Lead Channel Impedance Value: 380 Ohm
Lead Channel Impedance Value: 456 Ohm
Lead Channel Impedance Value: 494 Ohm
Lead Channel Pacing Threshold Amplitude: 0.625 V
Lead Channel Pacing Threshold Pulse Width: 0.4 ms
Lead Channel Sensing Intrinsic Amplitude: 3.5 mV
Lead Channel Sensing Intrinsic Amplitude: 3.5 mV
Lead Channel Sensing Intrinsic Amplitude: 4.375 mV
Lead Channel Sensing Intrinsic Amplitude: 4.375 mV
Lead Channel Setting Pacing Amplitude: 2.5 V
Lead Channel Setting Pacing Amplitude: 3.5 V
Lead Channel Setting Pacing Pulse Width: 0.4 ms
Lead Channel Setting Sensing Sensitivity: 1.2 mV

## 2019-12-27 NOTE — Progress Notes (Addendum)
Electrophysiology Office Note Date: 12/28/2019  ID:  Lori Wilson, Lori Wilson 23-Nov-1934, MRN [id number]  PCP: Isaac Bliss, Rayford Halsted, MD Primary Cardiologist: Jenkins Rouge, MD Electrophysiologist: Thompson Grayer, MD   CC: Pacemaker follow-up  Lori Wilson is a 85 y.o. female seen today for Thompson Grayer, MD for routine electrophysiology followup.  Since last being seen in our clinic the patient reports doing well overall. She is able to do her ADLs without any difficulty. She reports mild SOB if she is "out shopping for hours" or walking up stairs/an incline.  she denies chest pain, palpitations, PND, orthopnea, nausea, vomiting, dizziness, syncope, edema, weight gain, or early satiety.  Device History: Medtronic Dual Chamber PPM implanted 06/26/2019 for second degree AV block  Past Medical History:  Diagnosis Date  . Dizziness 08/12/2016  . Giant cell arteritis ([place]) 08/13/2016  . High cholesterol   . Hypertension   . Pneumonia   . Second degree Mobitz II AV block   . Tremor 02/24/2014  . Vertigo 08/12/2016   Past Surgical History:  Procedure Laterality Date  . BLADDER SURGERY    . catract Left 04/06/2014  . PACEMAKER IMPLANT N/A 06/26/2019   Procedure: PACEMAKER IMPLANT;  Surgeon: Thompson Grayer, MD;  Location: [facility];  Service: Cardiovascular;  Laterality: N/A;  . VAGINAL HYSTERECTOMY      Current Outpatient Medications  Medication Sig Dispense Refill  . acetaminophen (TYLENOL) 325 MG tablet Take 650 mg by mouth every 6 (six) hours as needed for mild pain.    Marland Kitchen allopurinol (ZYLOPRIM) 100 MG tablet TAKE 1 TABLET(100 MG) BY MOUTH DAILY 90 tablet 1  . amLODipine (NORVASC) 5 MG tablet TAKE 1 TABLET(5 MG) BY MOUTH DAILY 90 tablet 1  . dorzolamide (TRUSOPT) 2 % ophthalmic solution Place 1 drop into the left eye 2 (two) times daily.     Marland Kitchen ELIQUIS 5 MG TABS tablet TAKE 1 TABLET(5 MG) BY MOUTH TWICE DAILY 60 tablet 8  . furosemide (LASIX) 20 MG tablet Take 20 mg by  mouth every other day.    . meclizine (ANTIVERT) 12.5 MG tablet Take 12.5 mg by mouth 3 (three) times daily as needed for dizziness.    Marland Kitchen omeprazole (PRILOSEC) 20 MG capsule Take 20 mg by mouth every other day.    . potassium chloride (KLOR-CON) 10 MEQ tablet TAKE 1 TABLET(10 MEQ) BY MOUTH DAILY 90 tablet 1  . triamcinolone (KENALOG) 0.1 % paste as needed.     No current facility-administered medications for this visit.    Allergies:   Patient has no known allergies.   Social History: Social History   Socioeconomic History  . Marital status: Widowed    Spouse name: Not on file  . Number of children: 4  . Years of education: 49  . Highest education level: Not on file  Occupational History    Employer: RETIRED    Comment: Retired  Tobacco Use  . Smoking status: Never Smoker  . Smokeless tobacco: Never Used  Vaping Use  . Vaping Use: Never used  Substance and Sexual Activity  . Alcohol use: No    Alcohol/week: 0.0 standard drinks  . Drug use: No  . Sexual activity: Not on file  Other Topics Concern  . Not on file  Social History Narrative   Patient is widowed and her grandson stay with her.    Retired.   Education high school.   Right handed.   Caffeine coffee one cup daily.  Social Determinants of Health   Financial Resource Strain:   . Difficulty of Paying Living Expenses:   Food Insecurity:   . Worried About Charity fundraiser in the Last Year:   . Arboriculturist in the Last Year:   Transportation Needs:   . Film/video editor (Medical):   Marland Kitchen Lack of Transportation (Non-Medical):   Physical Activity:   . Days of Exercise per Week:   . Minutes of Exercise per Session:   Stress:   . Feeling of Stress :   Social Connections:   . Frequency of Communication with Friends and Family:   . Frequency of Social Gatherings with Friends and Family:   . Attends Religious Services:   . Active Member of Clubs or Organizations:   . Attends Archivist  Meetings:   Marland Kitchen Marital Status:   Intimate Partner Violence:   . Fear of Current or Ex-Partner:   . Emotionally Abused:   Marland Kitchen Physically Abused:   . Sexually Abused:     Family History: Family History  Problem Relation Age of Onset  . Heart attack Mother   . Diabetes Mother   . Stroke Father   . High blood pressure Father      Review of Systems: All other systems reviewed and are otherwise negative except as noted above.  Physical Exam: Vitals:   12/28/19 0947  BP: 120/80  Pulse: (!) 58  SpO2: 99%  Weight: 165 lb 12.8 oz (75.2 kg)  Height: 5' 7.2" (1.707 m)     GEN- The patient is well appearing, alert and oriented x 3 today.   HEENT: normocephalic, atraumatic; sclera clear, conjunctiva pink; hearing intact; oropharynx clear; neck supple  Lungs- Clear to ausculation bilaterally, normal work of breathing.  No wheezes, rales, rhonchi Heart- Regular rate and rhythm, no murmurs, rubs or gallops  GI- soft, non-tender, non-distended, bowel sounds present  Extremities- no clubbing, cyanosis, or edema  MS- no significant deformity or atrophy Skin- warm and dry, no rash or lesion; PPM pocket well healed Psych- euthymic mood, full affect Neuro- strength and sensation are intact  PPM Interrogation- reviewed in detail today,  See PACEART report  EKG:  EKG is ordered today. The ekg ordered today shows intermittent V pacing due to atrial ectopy/atrial tachycardia followed by AS/V-pacing  Recent Labs: 03/06/2019: ALT 25 06/23/2019: BUN 20; Creatinine, Ser 1.28; Hemoglobin 11.6; Platelets 276; Potassium 4.2; Sodium 138   Wt Readings from Last 3 Encounters:  12/28/19 165 lb 12.8 oz (75.2 kg)  11/26/19 166 lb 6.4 oz (75.5 kg)  08/28/19 166 lb 14.4 oz (75.7 kg)     Other studies Reviewed: Additional studies/ records that were reviewed today include: Previous EP office notes, Previous remote checks, Most recent labwork.   Assessment and Plan:  1. Second degree AV block s/p  Medtronic PPM  Normal PPM function. Atrial tachycardia occasionally causing 2:1 conduction. Reviewed settings with Medtronic rep in office and pt unlikely to benefit from programming changes at this time. Changing to DDD would likely cause faster tracked rates and more V-Pacing overall. Pt currently feeling well. Will continue to monitor with remotes.  See Pace Art report No changes today  2. Paroxysmal atrial fibrillation Continue eliquis for CHA2DS2VASC of at least 4   Burden 3.1% by device, also with periods of atrial tachycardia.  3. HTN Stable on current regimen  Current medicines are reviewed at length with the patient today.   The patient does not have concerns  regarding her medicines.  The following changes were made today:  none  Labs/ tests ordered today include:  Orders Placed This Encounter  Procedures  . EKG 12-Lead   Disposition:   Follow up with Dr. Rayann Heman in 12 Months   Signed, Annamaria Helling  12/28/2019 10:18 AM  [facility] [address] [place] [address] [phone] (office) [phone] (fax)

## 2019-12-28 ENCOUNTER — Ambulatory Visit (INDEPENDENT_AMBULATORY_CARE_PROVIDER_SITE_OTHER): Payer: Medicare Other | Admitting: Student

## 2019-12-28 ENCOUNTER — Other Ambulatory Visit: Payer: Self-pay

## 2019-12-28 ENCOUNTER — Encounter: Payer: Self-pay | Admitting: Student

## 2019-12-28 VITALS — BP 120/80 | HR 58 | Ht 67.2 in | Wt 165.8 lb

## 2019-12-28 DIAGNOSIS — I10 Essential (primary) hypertension: Secondary | ICD-10-CM | POA: Diagnosis not present

## 2019-12-28 DIAGNOSIS — R001 Bradycardia, unspecified: Secondary | ICD-10-CM | POA: Diagnosis not present

## 2019-12-28 DIAGNOSIS — I480 Paroxysmal atrial fibrillation: Secondary | ICD-10-CM

## 2019-12-28 DIAGNOSIS — R002 Palpitations: Secondary | ICD-10-CM | POA: Diagnosis not present

## 2019-12-28 LAB — CUP PACEART INCLINIC DEVICE CHECK
Battery Remaining Longevity: 164 mo
Battery Voltage: 3.17 V
Brady Statistic AP VP Percent: 5.6 %
Brady Statistic AP VS Percent: 0.14 %
Brady Statistic AS VP Percent: 29.43 %
Brady Statistic AS VS Percent: 64.83 %
Brady Statistic RA Percent Paced: 7.69 %
Brady Statistic RV Percent Paced: 34.01 %
Date Time Interrogation Session: 20210614101854
Implantable Lead Implant Date: 20201211
Implantable Lead Implant Date: 20201211
Implantable Lead Location: 753859
Implantable Lead Location: 753860
Implantable Lead Model: 5076
Implantable Lead Model: 5076
Implantable Pulse Generator Implant Date: 20201211
Lead Channel Impedance Value: 342 Ohm
Lead Channel Impedance Value: 456 Ohm
Lead Channel Impedance Value: 532 Ohm
Lead Channel Impedance Value: 532 Ohm
Lead Channel Pacing Threshold Amplitude: 0.625 V
Lead Channel Pacing Threshold Pulse Width: 0.4 ms
Lead Channel Sensing Intrinsic Amplitude: 3.5 mV
Lead Channel Sensing Intrinsic Amplitude: 3.5 mV
Lead Channel Sensing Intrinsic Amplitude: 5.25 mV
Lead Channel Sensing Intrinsic Amplitude: 5.25 mV
Lead Channel Setting Pacing Amplitude: 2 V
Lead Channel Setting Pacing Amplitude: 2.5 V
Lead Channel Setting Pacing Pulse Width: 0.4 ms
Lead Channel Setting Sensing Sensitivity: 1.2 mV

## 2019-12-28 NOTE — Patient Instructions (Addendum)
Medication Instructions:  none *If you need a refill on your cardiac medications before your next appointment, please call your pharmacy*   Lab Work: none If you have labs (blood work) drawn today and your tests are completely normal, you will receive your results only by: Marland Kitchen MyChart Message (if you have MyChart) OR . A paper copy in the mail If you have any lab test that is abnormal or we need to change your treatment, we will call you to review the results.   Testing/Procedures: none   Follow-Up: At [facility], you and your health needs are our priority.  As part of our continuing mission to provide you with exceptional heart care, we have created designated Provider Care Teams.  These Care Teams include your primary Cardiologist (physician) and Advanced Practice Providers (APPs -  Physician Assistants and Nurse Practitioners) who all work together to provide you with the care you need, when you need it.  We recommend signing up for the patient portal called "MyChart".  Sign up information is provided on this After Visit Summary.  MyChart is used to connect with patients for Virtual Visits (Telemedicine).  Patients are able to view lab/test results, encounter notes, upcoming appointments, etc.  Non-urgent messages can be sent to your provider as well.   To learn more about what you can do with MyChart, go to [email or web address].    Your next appointment:   1 year  The format for your next appointment:   In Person   Provider:   Oda Kilts, PA   Other Instructions Remote monitoring is used to monitor your Pacemaker from home. This monitoring reduces the number of office visits required to check your device to one time per year. It allows [place] to keep an eye on the functioning of your device to ensure it is working properly. You are scheduled for a device check from home on 03/25/20. You may send your transmission at any time that day. If you have a wireless device, the  transmission will be sent automatically. After your physician reviews your transmission, you will receive a postcard with your next transmission date.

## 2019-12-28 NOTE — Progress Notes (Signed)
Remote pacemaker transmission.   

## 2019-12-30 DIAGNOSIS — R1084 Generalized abdominal pain: Secondary | ICD-10-CM | POA: Diagnosis not present

## 2019-12-30 DIAGNOSIS — M6281 Muscle weakness (generalized): Secondary | ICD-10-CM | POA: Diagnosis not present

## 2019-12-30 DIAGNOSIS — M545 Low back pain: Secondary | ICD-10-CM | POA: Diagnosis not present

## 2020-01-01 ENCOUNTER — Other Ambulatory Visit: Payer: Self-pay | Admitting: Internal Medicine

## 2020-01-01 DIAGNOSIS — I10 Essential (primary) hypertension: Secondary | ICD-10-CM

## 2020-01-12 DIAGNOSIS — M19071 Primary osteoarthritis, right ankle and foot: Secondary | ICD-10-CM | POA: Diagnosis not present

## 2020-01-12 DIAGNOSIS — R768 Other specified abnormal immunological findings in serum: Secondary | ICD-10-CM | POA: Diagnosis not present

## 2020-01-12 DIAGNOSIS — M549 Dorsalgia, unspecified: Secondary | ICD-10-CM | POA: Diagnosis not present

## 2020-01-12 DIAGNOSIS — M79672 Pain in left foot: Secondary | ICD-10-CM | POA: Diagnosis not present

## 2020-01-12 DIAGNOSIS — M79671 Pain in right foot: Secondary | ICD-10-CM | POA: Diagnosis not present

## 2020-01-12 DIAGNOSIS — M150 Primary generalized (osteo)arthritis: Secondary | ICD-10-CM | POA: Diagnosis not present

## 2020-01-12 DIAGNOSIS — M316 Other giant cell arteritis: Secondary | ICD-10-CM | POA: Diagnosis not present

## 2020-01-12 DIAGNOSIS — M1009 Idiopathic gout, multiple sites: Secondary | ICD-10-CM | POA: Diagnosis not present

## 2020-01-12 DIAGNOSIS — M25569 Pain in unspecified knee: Secondary | ICD-10-CM | POA: Diagnosis not present

## 2020-01-12 DIAGNOSIS — M545 Low back pain: Secondary | ICD-10-CM | POA: Diagnosis not present

## 2020-01-12 DIAGNOSIS — M19072 Primary osteoarthritis, left ankle and foot: Secondary | ICD-10-CM | POA: Diagnosis not present

## 2020-01-14 DIAGNOSIS — M6281 Muscle weakness (generalized): Secondary | ICD-10-CM | POA: Diagnosis not present

## 2020-01-14 DIAGNOSIS — M545 Low back pain: Secondary | ICD-10-CM | POA: Diagnosis not present

## 2020-02-11 DIAGNOSIS — H402223 Chronic angle-closure glaucoma, left eye, severe stage: Secondary | ICD-10-CM | POA: Diagnosis not present

## 2020-02-11 DIAGNOSIS — H35363 Drusen (degenerative) of macula, bilateral: Secondary | ICD-10-CM | POA: Diagnosis not present

## 2020-02-11 DIAGNOSIS — H402211 Chronic angle-closure glaucoma, right eye, mild stage: Secondary | ICD-10-CM | POA: Diagnosis not present

## 2020-02-11 DIAGNOSIS — H35033 Hypertensive retinopathy, bilateral: Secondary | ICD-10-CM | POA: Diagnosis not present

## 2020-02-11 LAB — HM DIABETES EYE EXAM

## 2020-02-12 ENCOUNTER — Encounter: Payer: Self-pay | Admitting: Internal Medicine

## 2020-03-01 ENCOUNTER — Other Ambulatory Visit: Payer: Self-pay

## 2020-03-01 ENCOUNTER — Ambulatory Visit (INDEPENDENT_AMBULATORY_CARE_PROVIDER_SITE_OTHER): Payer: Medicare Other | Admitting: Internal Medicine

## 2020-03-01 ENCOUNTER — Encounter: Payer: Self-pay | Admitting: Internal Medicine

## 2020-03-01 VITALS — BP 102/78 | HR 71 | Temp 98.3°F | Ht 64.0 in | Wt 164.9 lb

## 2020-03-01 DIAGNOSIS — E7800 Pure hypercholesterolemia, unspecified: Secondary | ICD-10-CM | POA: Diagnosis not present

## 2020-03-01 DIAGNOSIS — Z950 Presence of cardiac pacemaker: Secondary | ICD-10-CM

## 2020-03-01 DIAGNOSIS — I10 Essential (primary) hypertension: Secondary | ICD-10-CM | POA: Diagnosis not present

## 2020-03-01 DIAGNOSIS — I441 Atrioventricular block, second degree: Secondary | ICD-10-CM

## 2020-03-01 DIAGNOSIS — Z0000 Encounter for general adult medical examination without abnormal findings: Secondary | ICD-10-CM | POA: Diagnosis not present

## 2020-03-01 NOTE — Patient Instructions (Signed)
-  Nice seeing you today!!  -Lab work today; will notify you once results are available.  -Remember your tetanus vaccine at your pharmacy.  -See you back in 6 months or sooner as needed.

## 2020-03-01 NOTE — Progress Notes (Signed)
Established Patient Office Visit     This visit occurred during the SARS-CoV-2 public health emergency.  Safety protocols were in place, including screening questions prior to the visit, additional usage of staff PPE, and extensive cleaning of exam room while observing appropriate contact time as indicated for disinfecting solutions.    CC/Reason for Visit: Subsequent Medicare wellness visit and follow-up chronic medical conditions  HPI: Lori Wilson is a 85 y.o. female who is coming in today for the above mentioned reasons. Past Medical History is significant for: Hypertension, hyperlipidemia, gout she is also status post permanent pacemaker in 2020 for second-degree heart block.  She has been feeling well and has no acute issues today.  She is due for Tdap booster, she has had both of her shingles vaccines.  She has routine eye and dental care.  She exercises 3 days a week with walking and exercise bands.   Past Medical/Surgical History: Past Medical History:  Diagnosis Date  . Dizziness 08/12/2016  . Giant cell arteritis ([place]) 08/13/2016  . High cholesterol   . Hypertension   . Pneumonia   . Second degree Mobitz II AV block   . Tremor 02/24/2014  . Vertigo 08/12/2016    Past Surgical History:  Procedure Laterality Date  . BLADDER SURGERY    . catract Left 04/06/2014  . PACEMAKER IMPLANT N/A 06/26/2019   Procedure: PACEMAKER IMPLANT;  Surgeon: Thompson Grayer, MD;  Location: [facility];  Service: Cardiovascular;  Laterality: N/A;  . VAGINAL HYSTERECTOMY      Social History:  reports that she has never smoked. She has never used smokeless tobacco. She reports that she does not drink alcohol and does not use drugs.  Allergies: No Known Allergies  Family History:  Family History  Problem Relation Age of Onset  . Heart attack Mother   . Diabetes Mother   . Stroke Father   . High blood pressure Father      Current Outpatient Medications:  .  acetaminophen  (TYLENOL) 325 MG tablet, Take 650 mg by mouth every 6 (six) hours as needed for mild pain., Disp: , Rfl:  .  allopurinol (ZYLOPRIM) 100 MG tablet, TAKE 1 TABLET(100 MG) BY MOUTH DAILY, Disp: 90 tablet, Rfl: 1 .  amLODipine (NORVASC) 5 MG tablet, TAKE 1 TABLET(5 MG) BY MOUTH DAILY, Disp: 90 tablet, Rfl: 1 .  dorzolamide (TRUSOPT) 2 % ophthalmic solution, Place 1 drop into the left eye 2 (two) times daily. , Disp: , Rfl:  .  ELIQUIS 5 MG TABS tablet, TAKE 1 TABLET(5 MG) BY MOUTH TWICE DAILY, Disp: 60 tablet, Rfl: 8 .  furosemide (LASIX) 20 MG tablet, Take 20 mg by mouth every other day., Disp: , Rfl:  .  meclizine (ANTIVERT) 12.5 MG tablet, Take 12.5 mg by mouth 3 (three) times daily as needed for dizziness., Disp: , Rfl:  .  omeprazole (PRILOSEC) 20 MG capsule, Take 20 mg by mouth every other day., Disp: , Rfl:  .  potassium chloride (KLOR-CON) 10 MEQ tablet, TAKE 1 TABLET(10 MEQ) BY MOUTH DAILY, Disp: 90 tablet, Rfl: 1 .  triamcinolone (KENALOG) 0.1 % paste, as needed., Disp: , Rfl:  .  HYDROcodone-acetaminophen (NORCO/VICODIN) 5-325 MG tablet, Take 1 tablet by mouth every 6 (six) hours as needed., Disp: , Rfl:   Review of Systems:  Constitutional: Denies fever, chills, diaphoresis, appetite change and fatigue.  HEENT: Denies photophobia, eye pain, redness, hearing loss, ear pain, congestion, sore throat, rhinorrhea, sneezing, mouth  sores, trouble swallowing, neck pain, neck stiffness and tinnitus.   Respiratory: Denies SOB, DOE, cough, chest tightness,  and wheezing.   Cardiovascular: Denies chest pain, palpitations and leg swelling.  Gastrointestinal: Denies nausea, vomiting, abdominal pain, diarrhea, constipation, blood in stool and abdominal distention.  Genitourinary: Denies dysuria, urgency, frequency, hematuria, flank pain and difficulty urinating.  Endocrine: Denies: hot or cold intolerance, sweats, changes in hair or nails, polyuria, polydipsia. Musculoskeletal: Denies myalgias, back  pain, joint swelling, arthralgias and gait problem.  Skin: Denies pallor, rash and wound.  Neurological: Denies dizziness, seizures, syncope, weakness, light-headedness, numbness and headaches.  Hematological: Denies adenopathy. Easy bruising, personal or family bleeding history  Psychiatric/Behavioral: Denies suicidal ideation, mood changes, confusion, nervousness, sleep disturbance and agitation    Physical Exam: Vitals:   03/01/20 0929  BP: 102/78  Pulse: 71  Temp: 98.3 F (36.8 C)  TempSrc: Oral  SpO2: 98%  Weight: 164 lb 14.4 oz (74.8 kg)  Height: 5\' 4"  (1.626 m)    Body mass index is 28.31 kg/m.   Constitutional: NAD, calm, comfortable Eyes: PERRL, lids and conjunctivae normal ENMT: Mucous membranes are moist.  Tympanic membrane is pearly white, no erythema or bulging. Neck: normal, supple, no masses, no thyromegaly Respiratory: clear to auscultation bilaterally, no wheezing, no crackles. Normal respiratory effort. No accessory muscle use.  Cardiovascular: Regular rate and rhythm, no murmurs / rubs / gallops. No extremity edema. 2+ pedal pulses.  Abdomen: no tenderness, no masses palpated. No hepatosplenomegaly. Bowel sounds positive.  Musculoskeletal: no clubbing / cyanosis. No joint deformity upper and lower extremities. Good ROM, no contractures. Normal muscle tone.  Skin: no rashes, lesions, ulcers. No induration Neurologic: CN 2-12 grossly intact. Sensation intact, DTR normal. Strength 5/5 in all 4.  Psychiatric: Normal judgment and insight. Alert and oriented x 3. Normal mood.    Subsequent Medicare wellness visit   1. Risk factors, based on past  M,S,F -cardiovascular disease risk factors include age, history of hypertension, history of hyperlipidemia   2.  Physical activities: She walks and does resistance bands 3 days a week   3.  Depression/mood:  Stable, not depressed   4.  Hearing:  No perceived issues   5.  ADL's: Independent in all ADLs   6.  Fall  risk:  Low fall risk   7.  Home safety: No problems identified   8.  Height weight, and visual acuity: Height and weight as above, visual acuity is 20/25 on the right, 20/32 on the left and 20/25 with eyes together   9.  Counseling:  Needs Tdap booster updated   10. Lab orders based on risk factors: Laboratory update will be reviewed   11. Referral :  None today   12. Care plan:  Follow-up with me in 6 months   13. Cognitive assessment:  No cognitive impairment   14. Screening: Patient provided with a written and personalized 5-10 year screening schedule in the AVS.   yes   15. Provider List Update:   PCP, cardiology  16. Advance Directives: Full code     Office Visit from 03/01/2020 in [facility] at [facility]  PHQ-9 Total Score 0      Fall Risk  03/01/2020 08/28/2019 07/02/2018 11/18/2017 08/21/2017  Falls in the past year? 0 0 1 No No  Comment - - - - -  Number falls in past yr: 0 0 0 - -  Injury with Fall? 0 0 0 - -  Follow up - Falls evaluation  completed - - -     Impression and Plan:  Encounter for preventive health examination -She has routine eye and dental care. -Due for Tdap booster otherwise immunizations are up-to-date and age-appropriate. -Screening labs today. -Healthy lifestyle discussed in detail. -She has elected to defer all further cancer screening due to age, she does however want to continue annual mammograms.  Essential hypertension -Well-controlled.  High cholesterol  - Plan: Lipid panel -Last LDL was 120 08/24/2016.  Pacemaker Second degree AV block -Follow-up with cardiology as scheduled.    Patient Instructions  -Nice seeing you today!!  -Lab work today; will notify you once results are available.  -Remember your tetanus vaccine at your pharmacy.  -See you back in 6 months or sooner as needed.     Lelon Frohlich, MD [facility]

## 2020-03-02 ENCOUNTER — Other Ambulatory Visit: Payer: Self-pay | Admitting: Internal Medicine

## 2020-03-02 DIAGNOSIS — E7800 Pure hypercholesterolemia, unspecified: Secondary | ICD-10-CM

## 2020-03-02 LAB — CBC WITH DIFFERENTIAL/PLATELET
Absolute Monocytes: 563 cells/uL (ref 200–950)
Basophils Absolute: 29 cells/uL (ref 0–200)
Basophils Relative: 0.5 %
Eosinophils Absolute: 232 cells/uL (ref 15–500)
Eosinophils Relative: 4 %
HCT: 35.5 % (ref 35.0–45.0)
Hemoglobin: 11.4 g/dL — ABNORMAL LOW (ref 11.7–15.5)
Lymphs Abs: 2842 cells/uL (ref 850–3900)
MCH: 28.4 pg (ref 27.0–33.0)
MCHC: 32.1 g/dL (ref 32.0–36.0)
MCV: 88.5 fL (ref 80.0–100.0)
MPV: 9.5 fL (ref 7.5–12.5)
Monocytes Relative: 9.7 %
Neutro Abs: 2134 cells/uL (ref 1500–7800)
Neutrophils Relative %: 36.8 %
Platelets: 277 10*3/uL (ref 140–400)
RBC: 4.01 10*6/uL (ref 3.80–5.10)
RDW: 13.2 % (ref 11.0–15.0)
Total Lymphocyte: 49 %
WBC: 5.8 10*3/uL (ref 3.8–10.8)

## 2020-03-02 LAB — LIPID PANEL
Cholesterol: 286 mg/dL — ABNORMAL HIGH (ref <200)
HDL: 55 mg/dL (ref >=50)
LDL Cholesterol (Calc): 196 mg/dL (calc) — ABNORMAL HIGH
Non-HDL Cholesterol (Calc): 231 mg/dL (calc) — ABNORMAL HIGH (ref <130)
Total CHOL/HDL Ratio: 5.2 (calc) — ABNORMAL HIGH (ref <5.0)
Triglycerides: 181 mg/dL — ABNORMAL HIGH (ref <150)

## 2020-03-02 LAB — COMPREHENSIVE METABOLIC PANEL
AG Ratio: 1 (calc) (ref 1.0–2.5)
ALT: 14 U/L (ref 6–29)
AST: 26 U/L (ref 10–35)
Albumin: 3.8 g/dL (ref 3.6–5.1)
Alkaline phosphatase (APISO): 62 U/L (ref 37–153)
BUN/Creatinine Ratio: 19 (calc) (ref 6–22)
BUN: 21 mg/dL (ref 7–25)
CO2: 24 mmol/L (ref 20–32)
Calcium: 9.5 mg/dL (ref 8.6–10.4)
Chloride: 104 mmol/L (ref 98–110)
Creat: 1.12 mg/dL — ABNORMAL HIGH (ref 0.60–0.88)
Globulin: 3.8 g/dL (calc) — ABNORMAL HIGH (ref 1.9–3.7)
Glucose, Bld: 85 mg/dL (ref 65–99)
Potassium: 5.1 mmol/L (ref 3.5–5.3)
Sodium: 136 mmol/L (ref 135–146)
Total Bilirubin: 0.9 mg/dL (ref 0.2–1.2)
Total Protein: 7.6 g/dL (ref 6.1–8.1)

## 2020-03-02 MED ORDER — ATORVASTATIN CALCIUM 20 MG PO TABS: 20.0000 mg | ORAL_TABLET | Freq: Every day | ORAL | 1 refills | Status: DC

## 2020-03-14 ENCOUNTER — Telehealth: Payer: Self-pay | Admitting: Internal Medicine

## 2020-03-14 DIAGNOSIS — I10 Essential (primary) hypertension: Secondary | ICD-10-CM

## 2020-03-14 MED ORDER — ALLOPURINOL 100 MG PO TABS: ORAL_TABLET | ORAL | 1 refills | Status: DC

## 2020-03-14 NOTE — Telephone Encounter (Signed)
Spoke with the pt and informed her refills for Allopurinol was sent to [facility].  I also reviewed the test results from 8/18 which was sent to the pts Mychart acct.  Patient stated she does not know how to access this acct.  Patient also stated she wanted to know what Dr Jerilee Hoh recommended for the episodes she has intermittently with small amounts of feces that occur without notice.  Patient stated she mentioned this at the last visit and PCP told her to use wipes.  I reviewed the last 3 office visits and did not see this mentioned.  Message sent to PCP.

## 2020-03-14 NOTE — Telephone Encounter (Signed)
Pt would like a refill for allopurinol 100mg . Send to [facility] on [address] would also like lab results. Pt picked up atorvastatin medication but did not know what it is for. Please call patient.

## 2020-03-25 ENCOUNTER — Ambulatory Visit (INDEPENDENT_AMBULATORY_CARE_PROVIDER_SITE_OTHER): Payer: Medicare Other | Admitting: *Deleted

## 2020-03-25 DIAGNOSIS — I441 Atrioventricular block, second degree: Secondary | ICD-10-CM | POA: Diagnosis not present

## 2020-03-25 LAB — CUP PACEART REMOTE DEVICE CHECK
Battery Remaining Longevity: 159 mo
Battery Voltage: 3.13 V
Brady Statistic AP VP Percent: 6.97 %
Brady Statistic AP VS Percent: 0.12 %
Brady Statistic AS VP Percent: 28.66 %
Brady Statistic AS VS Percent: 64.25 %
Brady Statistic RA Percent Paced: 9.69 %
Brady Statistic RV Percent Paced: 35.11 %
Date Time Interrogation Session: 20210910051525
Implantable Lead Implant Date: 20201211
Implantable Lead Implant Date: 20201211
Implantable Lead Location: 753859
Implantable Lead Location: 753860
Implantable Lead Model: 5076
Implantable Lead Model: 5076
Implantable Pulse Generator Implant Date: 20201211
Lead Channel Impedance Value: 304 Ohm
Lead Channel Impedance Value: 361 Ohm
Lead Channel Impedance Value: 437 Ohm
Lead Channel Impedance Value: 570 Ohm
Lead Channel Pacing Threshold Amplitude: 0.625 V
Lead Channel Pacing Threshold Pulse Width: 0.4 ms
Lead Channel Sensing Intrinsic Amplitude: 3.5 mV
Lead Channel Sensing Intrinsic Amplitude: 3.5 mV
Lead Channel Sensing Intrinsic Amplitude: 4.125 mV
Lead Channel Sensing Intrinsic Amplitude: 4.125 mV
Lead Channel Setting Pacing Amplitude: 2 V
Lead Channel Setting Pacing Amplitude: 2.5 V
Lead Channel Setting Pacing Pulse Width: 0.4 ms
Lead Channel Setting Sensing Sensitivity: 1.2 mV

## 2020-03-28 NOTE — Progress Notes (Signed)
Remote pacemaker transmission.   

## 2020-04-04 ENCOUNTER — Other Ambulatory Visit: Payer: Self-pay | Admitting: Internal Medicine

## 2020-05-08 ENCOUNTER — Other Ambulatory Visit: Payer: Self-pay | Admitting: Internal Medicine

## 2020-06-03 NOTE — Progress Notes (Signed)
Patient ID: Lori Wilson MRN: [id number]; DOB: [birth year]  Admit date: (Not on file) Date of Consult: 06/15/2020  Primary Care Provider: Isaac Wilson, Lori Halsted, MD Primary Cardiologist: New/Jorey Dollard Primary Electrophysiologist:  Allred    History of Present Illness:   [name] 85 y.o. no history of cardiac disease. First seen 11/07/18  CRF;s HTN and HLD history of gout , vertigo and pneumonia. Pevious giant cell arteritis now off prednisone Seen by primary 09/23/27 with 2 week complaint of palpitations. Heart beating in her neck Occurs daily Gets fatigue and needs to rest No associated syncope dyspnea or chest pain Pulse was 72 in office with NSR on ECG Echo done 08/12/16 for dizziness was normal with EF 60-65% just mild AR and aortic root 4.2 cm Carotid done for same reason plaque no stenosis in ICA;s   She is widowed over 44 years Raised 4 kids with youngest son living with her   Monitor reviewed 12/26/18 with rare PAF rates 150 but frequent episodes of 2:1 AV block Had Medtronic dual chamber PPM placed 06/26/19   PACEART has shown normal function with 3.1% PAF burden CHADVASC 4 on eliquis   She has pain and paraesthesias in left shoulder still but some of it sounds like arthritis     Past Medical History:  Diagnosis Date  . Dizziness 08/12/2016  . Giant cell arteritis ([place]) 08/13/2016  . High cholesterol   . Hypertension   . Pneumonia   . Second degree Mobitz II AV block   . Tremor 02/24/2014  . Vertigo 08/12/2016    Past Surgical History:  Procedure Laterality Date  . BLADDER SURGERY    . catract Left 04/06/2014  . PACEMAKER IMPLANT N/A 06/26/2019   Procedure: PACEMAKER IMPLANT;  Surgeon: Thompson Grayer, MD;  Location: [facility];  Service: Cardiovascular;  Laterality: N/A;  . VAGINAL HYSTERECTOMY       Home Medications:  Prior to Admission medications   Medication Sig Start Date End Date Taking? Authorizing Provider  acetaminophen (TYLENOL) 325 MG tablet  Take 650 mg by mouth every 6 (six) hours as needed for mild pain.    [provider]  allopurinol (ZYLOPRIM) 100 MG tablet Take 1 tablet (100 mg total) by mouth daily. 07/02/18   Lori Wilson, Lori Halsted, MD  amLODipine (NORVASC) 5 MG tablet Take 1 tablet (5 mg total) by mouth daily. 07/02/18   Lori Wilson, Lori Halsted, MD  aspirin 81 MG tablet Take 81 mg by mouth daily.    [provider]  atorvastatin (LIPITOR) 10 MG tablet Take 1 tablet (10 mg total) by mouth daily. Patient not taking: Reported on 09/23/2018 07/02/18   Lori Wilson, Lori Halsted, MD  dorzolamide (TRUSOPT) 2 % ophthalmic solution Place 1 drop into the left eye 2 (two) times daily.     [provider]  furosemide (LASIX) 20 MG tablet Take 0.5 tablets (10 mg total) by mouth daily. 07/02/18   Lori Wilson, Lori Halsted, MD  hydroxypropyl methylcellulose / hypromellose (ISOPTO TEARS / GONIOVISC) 2.5 % ophthalmic solution Place 1 drop into both eyes 3 (three) times daily as needed for dry eyes.    [provider]  meclizine (ANTIVERT) 12.5 MG tablet Take 1 tablet (12.5 mg total) by mouth 2 (two) times daily as needed for dizziness. 07/02/18   Lori Wilson, Lori Halsted, MD  potassium chloride (K-DUR) 10 MEQ tablet TAKE 1 TABLET(10 MEQ) BY MOUTH DAILY 08/14/18   Lori Wilson, Lori Halsted, MD  Inpatient Medications: Scheduled Meds:  Continuous Infusions:  PRN Meds:   Allergies:   No Known Allergies  Social History:   Social History   Socioeconomic History  . Marital status: Widowed    Spouse name: Not on file  . Number of children: 4  . Years of education: 22  . Highest education level: Not on file  Occupational History    Employer: RETIRED    Comment: Retired  Tobacco Use  . Smoking status: Never Smoker  . Smokeless tobacco: Never Used  Vaping Use  . Vaping Use: Never used  Substance and Sexual Activity  . Alcohol use: No    Alcohol/week: 0.0 standard drinks  . Drug use:  No  . Sexual activity: Not on file  Other Topics Concern  . Not on file  Social History Narrative   Patient is widowed and her grandson stay with her.    Retired.   Education high school.   Right handed.   Caffeine coffee one cup daily.   Social Determinants of Health   Financial Resource Strain:   . Difficulty of Paying Living Expenses: Not on file  Food Insecurity:   . Worried About Charity fundraiser in the Last Year: Not on file  . Ran Out of Food in the Last Year: Not on file  Transportation Needs:   . Lack of Transportation (Medical): Not on file  . Lack of Transportation (Non-Medical): Not on file  Physical Activity:   . Days of Exercise per Week: Not on file  . Minutes of Exercise per Session: Not on file  Stress:   . Feeling of Stress : Not on file  Social Connections:   . Frequency of Communication with Friends and Family: Not on file  . Frequency of Social Gatherings with Friends and Family: Not on file  . Attends Religious Services: Not on file  . Active Member of Clubs or Organizations: Not on file  . Attends Archivist Meetings: Not on file  . Marital Status: Not on file  Intimate Partner Violence:   . Fear of Current or Ex-Partner: Not on file  . Emotionally Abused: Not on file  . Physically Abused: Not on file  . Sexually Abused: Not on file    Family History:    Family History  Problem Relation Age of Onset  . Heart attack Mother   . Diabetes Mother   . Stroke Father   . High blood pressure Father      ROS:  Please see the history of present illness.   All other ROS reviewed and negative.     Physical Exam/Data:   Vitals:   06/15/20 0937  BP: 124/68  Pulse: 64  SpO2: 98%  Weight: 76.9 kg  Height: 5\' 4"  (1.626 m)   @IOBRIEF @ Last 3 Weights 06/15/2020 03/01/2020 12/28/2019  Weight (lbs) 169 lb 9.6 oz 164 lb 14.4 oz 165 lb 12.8 oz  Weight (kg) 76.93 kg 74.798 kg 75.206 kg     Body mass index is 29.11 kg/m.   Affect  appropriate Healthy:  appears stated age 14: normal Neck supple with no adenopathy JVP normal no bruits no thyromegaly Lungs clear with no wheezing and good diaphragmatic motion Heart:  S1/S2 no murmur, no rub, gallop or click PMI normal pacer under left clavicle  Abdomen: benighn, BS positve, no tenderness, no AAA no bruit.  No HSM or HJR Distal pulses intact with no bruits No edema Neuro non-focal Skin warm and dry No  muscular weakness   ECG:  11/07/18 SR rate 65 PR 236 low voltage no acute ST changes   Relevant CV Studies: Echo 08/12/16 Carotid 08/13/16 [place] 12/26/18    Radiology/Studies:  No results found.  Assessment and Plan:   1. PAF:  Continue eliquis low burden stable  2. HTN: Well controlled.  Continue current medications and low sodium Dash type diet.   3. HLD on statin labs with primary  4. Vertigo has antivert had normal MRI of head 08/12/16 5. Arteritis: f/u rheumatology off prednisone  6. Gout continue allopurinol quiescent  7.  PPM:  medtronic 06/26/19 implant f/u Dr Rayann Heman   F/U with Dr Rayann Heman and me in a year    For questions or updates, please contact [facility] Please consult [email or web address] for contact info under     Signed, Jenkins Rouge, MD  06/15/2020 9:46 AM

## 2020-06-15 ENCOUNTER — Other Ambulatory Visit: Payer: Self-pay

## 2020-06-15 ENCOUNTER — Encounter: Payer: Self-pay | Admitting: Cardiovascular Disease

## 2020-06-15 ENCOUNTER — Ambulatory Visit (INDEPENDENT_AMBULATORY_CARE_PROVIDER_SITE_OTHER): Payer: Medicare Other | Admitting: Cardiovascular Disease

## 2020-06-15 VITALS — BP 124/68 | HR 64 | Ht 64.0 in | Wt 169.6 lb

## 2020-06-15 DIAGNOSIS — Z950 Presence of cardiac pacemaker: Secondary | ICD-10-CM

## 2020-06-15 DIAGNOSIS — I480 Paroxysmal atrial fibrillation: Secondary | ICD-10-CM

## 2020-06-15 NOTE — Patient Instructions (Signed)

## 2020-06-24 ENCOUNTER — Ambulatory Visit (INDEPENDENT_AMBULATORY_CARE_PROVIDER_SITE_OTHER): Payer: Medicare Other

## 2020-06-24 DIAGNOSIS — I441 Atrioventricular block, second degree: Secondary | ICD-10-CM

## 2020-06-27 LAB — CUP PACEART REMOTE DEVICE CHECK
Battery Remaining Longevity: 154 mo
Battery Voltage: 3.08 V
Brady Statistic AP VP Percent: 8.43 %
Brady Statistic AP VS Percent: 0.12 %
Brady Statistic AS VP Percent: 35.53 %
Brady Statistic AS VS Percent: 55.92 %
Brady Statistic RA Percent Paced: 11.58 %
Brady Statistic RV Percent Paced: 43.26 %
Date Time Interrogation Session: 20211210102342
Implantable Lead Implant Date: 20201211
Implantable Lead Implant Date: 20201211
Implantable Lead Location: 753859
Implantable Lead Location: 753860
Implantable Lead Model: 5076
Implantable Lead Model: 5076
Implantable Pulse Generator Implant Date: 20201211
Lead Channel Impedance Value: 304 Ohm
Lead Channel Impedance Value: 380 Ohm
Lead Channel Impedance Value: 437 Ohm
Lead Channel Impedance Value: 437 Ohm
Lead Channel Pacing Threshold Amplitude: 0.625 V
Lead Channel Pacing Threshold Pulse Width: 0.4 ms
Lead Channel Sensing Intrinsic Amplitude: 3.375 mV
Lead Channel Sensing Intrinsic Amplitude: 3.375 mV
Lead Channel Sensing Intrinsic Amplitude: 4.375 mV
Lead Channel Sensing Intrinsic Amplitude: 4.375 mV
Lead Channel Setting Pacing Amplitude: 2 V
Lead Channel Setting Pacing Amplitude: 2.5 V
Lead Channel Setting Pacing Pulse Width: 0.4 ms
Lead Channel Setting Sensing Sensitivity: 1.2 mV

## 2020-07-01 ENCOUNTER — Other Ambulatory Visit: Payer: Self-pay | Admitting: Internal Medicine

## 2020-07-01 DIAGNOSIS — I10 Essential (primary) hypertension: Secondary | ICD-10-CM

## 2020-07-07 NOTE — Progress Notes (Signed)
Remote pacemaker transmission.   

## 2020-07-22 DIAGNOSIS — H402211 Chronic angle-closure glaucoma, right eye, mild stage: Secondary | ICD-10-CM | POA: Diagnosis not present

## 2020-07-22 DIAGNOSIS — H402223 Chronic angle-closure glaucoma, left eye, severe stage: Secondary | ICD-10-CM | POA: Diagnosis not present

## 2020-07-22 DIAGNOSIS — H40052 Ocular hypertension, left eye: Secondary | ICD-10-CM | POA: Diagnosis not present

## 2020-08-04 ENCOUNTER — Telehealth: Payer: Self-pay | Admitting: Internal Medicine

## 2020-08-04 NOTE — Telephone Encounter (Signed)
Not prescribed by Dr Jerilee Hoh.  Okay to refill?

## 2020-08-04 NOTE — Telephone Encounter (Signed)
Pt call and need a refill on ELIQUIS 5 MG TABS tablet sent to  [id number] - [place], [place] AT [place] Phone:  [phone]  Fax:  [phone]

## 2020-08-05 NOTE — Telephone Encounter (Signed)
Denial sent to the pharmacy.

## 2020-08-05 NOTE — Telephone Encounter (Signed)
No. Prescribing provider will need to refill.

## 2020-08-09 ENCOUNTER — Telehealth: Payer: Self-pay

## 2020-08-09 NOTE — Telephone Encounter (Signed)
I started a Eliquis PA through covermymeds: Key: [id number]

## 2020-08-11 NOTE — Telephone Encounter (Signed)
**Note De-Identified [facility] Reichardt Obfuscation** Letter received Fleming Prill fax from [place] stating that they approved the pts Eliquis PA. Approval is valid until 08/09/2021.  I have notified Virgil of this approval.

## 2020-09-01 ENCOUNTER — Telehealth (INDEPENDENT_AMBULATORY_CARE_PROVIDER_SITE_OTHER): Payer: Medicare Other | Admitting: Internal Medicine

## 2020-09-01 ENCOUNTER — Encounter: Payer: Self-pay | Admitting: Internal Medicine

## 2020-09-01 ENCOUNTER — Telehealth: Payer: Self-pay | Admitting: *Deleted

## 2020-09-01 ENCOUNTER — Other Ambulatory Visit: Payer: Self-pay

## 2020-09-01 VITALS — Wt 162.0 lb

## 2020-09-01 DIAGNOSIS — E7800 Pure hypercholesterolemia, unspecified: Secondary | ICD-10-CM | POA: Diagnosis not present

## 2020-09-01 DIAGNOSIS — I10 Essential (primary) hypertension: Secondary | ICD-10-CM

## 2020-09-01 DIAGNOSIS — I480 Paroxysmal atrial fibrillation: Secondary | ICD-10-CM | POA: Diagnosis not present

## 2020-09-01 NOTE — Telephone Encounter (Signed)
FYI  Patient called to inform us that she had to pay $500 for ELIQUIS 5 MG TABS tablet.  I called [employer] and was informed that her policy was terminated 66/44/0347.  Patient also called and she"will receive her new insurance card in 3 weeks".

## 2020-09-01 NOTE — Progress Notes (Signed)
Virtual Visit via Telephone Note  I connected with Lori Wilson on 09/01/20 at  9:30 AM EST by telephone and verified that I am speaking with the correct person using two identifiers.   I discussed the limitations, risks, security and privacy concerns of performing an evaluation and management service by telephone and the availability of in person appointments. I also discussed with the patient that there may be a patient responsible charge related to this service. The patient expressed understanding and agreed to proceed.  Location patient: home Location provider: work office Participants present for the call: patient, provider Patient did not have a visit in the prior 7 days to address this/these issue(s).   History of Present Illness:  This is a scheduled visit for follow up of chronic medical conditions. Her PMH is significant for HTN, HLD, gout and second degree HB s/p PPM. Since I last saw she was found to have PAF on pacemaker interrogation. She was started on Eliquis by cardiology. She was told she would have to pay $1000 for it. It is cost-prohibitive. Looks like a PA was started by cardiology office. She has been having some worsening hip pain and has been seeing cardiology. She also has a h/o GCA but has now been completely weaned off steroids. Her LDL was 196 back in 8/21 and she was started on atorvastatin. She is tolerating it well. Keeps a close check on her BP at home and states it has been around 120/70 on most days.   Observations/Objective: Patient sounds cheerful and well on the phone. I do not appreciate any increased work of breathing. Speech and thought processing are grossly intact. Patient reported vitals: BP 120/70 this am   Current Outpatient Medications:  .  acetaminophen (TYLENOL) 325 MG tablet, Take 650 mg by mouth every 6 (six) hours as needed for mild pain., Disp: , Rfl:  .  allopurinol (ZYLOPRIM) 100 MG tablet, TAKE 1 TABLET(100 MG) BY MOUTH DAILY,  Disp: 90 tablet, Rfl: 1 .  amLODipine (NORVASC) 5 MG tablet, TAKE 1 TABLET(5 MG) BY MOUTH DAILY, Disp: 90 tablet, Rfl: 1 .  atorvastatin (LIPITOR) 20 MG tablet, Take 1 tablet (20 mg total) by mouth daily., Disp: 90 tablet, Rfl: 1 .  dorzolamide (TRUSOPT) 2 % ophthalmic solution, Place 1 drop into the left eye 2 (two) times daily. , Disp: , Rfl:  .  ELIQUIS 5 MG TABS tablet, TAKE 1 TABLET(5 MG) BY MOUTH TWICE DAILY, Disp: 60 tablet, Rfl: 8 .  furosemide (LASIX) 20 MG tablet, TAKE 1/2 TABLET(10 MG) BY MOUTH DAILY, Disp: 45 tablet, Rfl: 2 .  meclizine (ANTIVERT) 12.5 MG tablet, Take 12.5 mg by mouth 3 (three) times daily as needed for dizziness., Disp: , Rfl:  .  omeprazole (PRILOSEC) 20 MG capsule, Take 20 mg by mouth every other day., Disp: , Rfl:  .  potassium chloride (KLOR-CON) 10 MEQ tablet, TAKE 1 TABLET(10 MEQ) BY MOUTH DAILY, Disp: 90 tablet, Rfl: 1 .  triamcinolone (KENALOG) 0.1 % paste, as needed., Disp: , Rfl:   Review of Systems:  Constitutional: Denies fever, chills, diaphoresis, appetite change and fatigue.  HEENT: Denies photophobia, eye pain, redness, hearing loss, ear pain, congestion, sore throat, rhinorrhea, sneezing, mouth sores, trouble swallowing, neck pain, neck stiffness and tinnitus.   Respiratory: Denies SOB, DOE, cough, chest tightness,  and wheezing.   Cardiovascular: Denies chest pain, palpitations and leg swelling.  Gastrointestinal: Denies nausea, vomiting, abdominal pain, diarrhea, constipation, blood in stool and abdominal distention.  Genitourinary: Denies dysuria, urgency, frequency, hematuria, flank pain and difficulty urinating.  Endocrine: Denies: hot or cold intolerance, sweats, changes in hair or nails, polyuria, polydipsia. Musculoskeletal: Denies myalgias, back pain, joint swelling. Skin: Denies pallor, rash and wound.  Neurological: Denies dizziness, seizures, syncope, weakness, light-headedness, numbness and headaches.  Hematological: Denies  adenopathy. Easy bruising, personal or family bleeding history  Psychiatric/Behavioral: Denies suicidal ideation, mood changes, confusion, nervousness, sleep disturbance and agitation   Assessment and Plan:  PAF (paroxysmal atrial fibrillation) ([place]) -Cost should improve now that PA has been done. She has been advised to reach out to cardiology after she speaks with her pharmacy if she is still having cost issues.  Primary hypertension -Well controlled per report.  High cholesterol  -It has been 6 months since she was started on statin. - Plan: Comprehensive metabolic panel, Lipid panel    I discussed the assessment and treatment plan with the patient. The patient was provided an opportunity to ask questions and all were answered. The patient agreed with the plan and demonstrated an understanding of the instructions.   The patient was advised to call back or seek an in-person evaluation if the symptoms worsen or if the condition fails to improve as anticipated.  I provided 23 minutes of non-face-to-face time during this encounter.   Lelon Frohlich, MD [facility]

## 2020-09-02 ENCOUNTER — Other Ambulatory Visit: Payer: Self-pay | Admitting: Internal Medicine

## 2020-09-02 DIAGNOSIS — Z1231 Encounter for screening mammogram for malignant neoplasm of breast: Secondary | ICD-10-CM

## 2020-09-06 ENCOUNTER — Other Ambulatory Visit: Payer: Medicare Other

## 2020-09-07 NOTE — Telephone Encounter (Signed)
Patient son is calling and wanted to see if patient could get a refill for Eliquis for 60 day supply sent to  [facility] [address], [place], [place] [address]  Phone:  [phone] Fax:  [phone]  CB is [phone]

## 2020-09-07 NOTE — Telephone Encounter (Signed)
Last refill came from cardiology.  Okay to fill?

## 2020-09-08 ENCOUNTER — Other Ambulatory Visit: Payer: Medicare Other

## 2020-09-08 NOTE — Telephone Encounter (Signed)
Would advise they contact cardiology office for refills. If she is out ok to refill once.

## 2020-09-09 ENCOUNTER — Other Ambulatory Visit: Payer: Self-pay

## 2020-09-09 ENCOUNTER — Other Ambulatory Visit (INDEPENDENT_AMBULATORY_CARE_PROVIDER_SITE_OTHER): Payer: Medicare Other

## 2020-09-09 DIAGNOSIS — E7800 Pure hypercholesterolemia, unspecified: Secondary | ICD-10-CM | POA: Diagnosis not present

## 2020-09-09 LAB — COMPREHENSIVE METABOLIC PANEL
ALT: 14 U/L (ref 0–35)
AST: 24 U/L (ref 0–37)
Albumin: 3.9 g/dL (ref 3.5–5.2)
Alkaline Phosphatase: 69 U/L (ref 39–117)
BUN: 20 mg/dL (ref 6–23)
CO2: 24 mEq/L (ref 19–32)
Calcium: 9.6 mg/dL (ref 8.4–10.5)
Chloride: 103 mEq/L (ref 96–112)
Creatinine, Ser: 1.12 mg/dL (ref 0.40–1.20)
GFR: 45.56 mL/min — ABNORMAL LOW (ref >60.00)
Glucose, Bld: 90 mg/dL (ref 70–99)
Potassium: 4.3 mEq/L (ref 3.5–5.1)
Sodium: 136 mEq/L (ref 135–145)
Total Bilirubin: 0.7 mg/dL (ref 0.2–1.2)
Total Protein: 7.8 g/dL (ref 6.0–8.3)

## 2020-09-09 LAB — LIPID PANEL
Cholesterol: 207 mg/dL — ABNORMAL HIGH (ref 0–200)
HDL: 50.1 mg/dL (ref >39.00)
NonHDL: 156.56
Total CHOL/HDL Ratio: 4
Triglycerides: 209 mg/dL — ABNORMAL HIGH (ref 0.0–149.0)
VLDL: 41.8 mg/dL — ABNORMAL HIGH (ref 0.0–40.0)

## 2020-09-09 LAB — LDL CHOLESTEROL, DIRECT: Direct LDL: 95 mg/dL

## 2020-09-09 MED ORDER — APIXABAN 5 MG PO TABS: ORAL_TABLET | ORAL | 3 refills | Status: DC

## 2020-09-09 NOTE — Addendum Note (Signed)
Addended by: Marcelle Overlie D on: 09/09/2020 08:09 AM   Modules accepted: Orders

## 2020-09-23 ENCOUNTER — Ambulatory Visit (INDEPENDENT_AMBULATORY_CARE_PROVIDER_SITE_OTHER): Payer: Medicare Other

## 2020-09-23 DIAGNOSIS — I441 Atrioventricular block, second degree: Secondary | ICD-10-CM

## 2020-09-26 LAB — CUP PACEART REMOTE DEVICE CHECK
Battery Remaining Longevity: 148 mo
Battery Voltage: 3.04 V
Brady Statistic AP VP Percent: 9.02 %
Brady Statistic AP VS Percent: 0.11 %
Brady Statistic AS VP Percent: 38.72 %
Brady Statistic AS VS Percent: 52.16 %
Brady Statistic RA Percent Paced: 12.46 %
Brady Statistic RV Percent Paced: 47.11 %
Date Time Interrogation Session: 20220311052412
Implantable Lead Implant Date: 20201211
Implantable Lead Implant Date: 20201211
Implantable Lead Location: 753859
Implantable Lead Location: 753860
Implantable Lead Model: 5076
Implantable Lead Model: 5076
Implantable Pulse Generator Implant Date: 20201211
Lead Channel Impedance Value: 285 Ohm
Lead Channel Impedance Value: 323 Ohm
Lead Channel Impedance Value: 380 Ohm
Lead Channel Impedance Value: 399 Ohm
Lead Channel Pacing Threshold Amplitude: 0.75 V
Lead Channel Pacing Threshold Pulse Width: 0.4 ms
Lead Channel Sensing Intrinsic Amplitude: 3.375 mV
Lead Channel Sensing Intrinsic Amplitude: 3.375 mV
Lead Channel Sensing Intrinsic Amplitude: 4.375 mV
Lead Channel Sensing Intrinsic Amplitude: 4.375 mV
Lead Channel Setting Pacing Amplitude: 2 V
Lead Channel Setting Pacing Amplitude: 2.5 V
Lead Channel Setting Pacing Pulse Width: 0.4 ms
Lead Channel Setting Sensing Sensitivity: 1.2 mV

## 2020-10-03 NOTE — Progress Notes (Signed)
Remote pacemaker transmission.   

## 2020-10-05 ENCOUNTER — Other Ambulatory Visit: Payer: Self-pay | Admitting: Internal Medicine

## 2020-10-05 DIAGNOSIS — E7800 Pure hypercholesterolemia, unspecified: Secondary | ICD-10-CM

## 2020-10-24 ENCOUNTER — Ambulatory Visit
Admission: RE | Admit: 2020-10-24 | Discharge: 2020-10-24 | Disposition: A | Discharge status: Home / Self Care | Payer: Medicare Other | Source: Ambulatory Visit | Attending: Internal Medicine | Admitting: Internal Medicine

## 2020-10-24 ENCOUNTER — Other Ambulatory Visit: Payer: Self-pay

## 2020-10-24 DIAGNOSIS — Z1231 Encounter for screening mammogram for malignant neoplasm of breast: Secondary | ICD-10-CM

## 2020-12-23 ENCOUNTER — Ambulatory Visit (INDEPENDENT_AMBULATORY_CARE_PROVIDER_SITE_OTHER): Payer: Medicare Other

## 2020-12-23 DIAGNOSIS — I441 Atrioventricular block, second degree: Secondary | ICD-10-CM | POA: Diagnosis not present

## 2020-12-23 LAB — CUP PACEART REMOTE DEVICE CHECK
Battery Remaining Longevity: 146 mo
Battery Voltage: 3.03 V
Brady Statistic AP VP Percent: 8.83 %
Brady Statistic AP VS Percent: 0.16 %
Brady Statistic AS VP Percent: 38.06 %
Brady Statistic AS VS Percent: 52.96 %
Brady Statistic RA Percent Paced: 12.43 %
Brady Statistic RV Percent Paced: 46.35 %
Date Time Interrogation Session: 20220609193712
Implantable Lead Implant Date: 20201211
Implantable Lead Implant Date: 20201211
Implantable Lead Location: 753859
Implantable Lead Location: 753860
Implantable Lead Model: 5076
Implantable Lead Model: 5076
Implantable Pulse Generator Implant Date: 20201211
Lead Channel Impedance Value: 285 Ohm
Lead Channel Impedance Value: 323 Ohm
Lead Channel Impedance Value: 399 Ohm
Lead Channel Impedance Value: 475 Ohm
Lead Channel Pacing Threshold Amplitude: 0.75 V
Lead Channel Pacing Threshold Pulse Width: 0.4 ms
Lead Channel Sensing Intrinsic Amplitude: 3.625 mV
Lead Channel Sensing Intrinsic Amplitude: 3.625 mV
Lead Channel Sensing Intrinsic Amplitude: 4.25 mV
Lead Channel Sensing Intrinsic Amplitude: 4.25 mV
Lead Channel Setting Pacing Amplitude: 2 V
Lead Channel Setting Pacing Amplitude: 2.5 V
Lead Channel Setting Pacing Pulse Width: 0.4 ms
Lead Channel Setting Sensing Sensitivity: 1.2 mV

## 2020-12-26 ENCOUNTER — Ambulatory Visit (INDEPENDENT_AMBULATORY_CARE_PROVIDER_SITE_OTHER): Payer: Medicare Other | Admitting: Internal Medicine

## 2020-12-26 DIAGNOSIS — I480 Paroxysmal atrial fibrillation: Secondary | ICD-10-CM

## 2020-12-26 DIAGNOSIS — I10 Essential (primary) hypertension: Secondary | ICD-10-CM

## 2020-12-26 DIAGNOSIS — I441 Atrioventricular block, second degree: Secondary | ICD-10-CM

## 2020-12-30 NOTE — Progress Notes (Signed)
   Complete physical exam  Patient: Lori Wilson   DOB: 05/05/1997   86 y.o. Female  MRN: [id number]  Subjective:    No chief complaint on file.   Lori Wilson is a 86 y.o. female who presents today for a complete physical exam. She reports consuming a {diet types:17450} diet. {types:19826} She generally feels {DESC; WELL/FAIRLY WELL/POORLY:18703}. She reports sleeping {DESC; WELL/FAIRLY WELL/POORLY:18703}. She {does/does not:200015} have additional problems to discuss today.    Most recent fall risk assessment:    01/10/2022   10:42 AM  Fall Risk   Falls in the past year? 0  Number falls in past yr: 0  Injury with Fall? 0  Risk for fall due to : No Fall Risks  Follow up Falls evaluation completed     Most recent depression screenings:    01/10/2022   10:42 AM 12/01/2020   10:46 AM  PHQ 2/9 Scores  PHQ - 2 Score 0 0  PHQ- 9 Score 5     {VISON DENTAL STD PSA (Optional):27386}  {History (Optional):23778}  Patient Care Team: Jessup, Joy, NP as PCP - General (Nurse Practitioner)   Outpatient Medications Prior to Visit  Medication Sig   fluticasone (FLONASE) 50 MCG/ACT nasal spray Place 2 sprays into both nostrils in the morning and at bedtime. After 7 days, reduce to once daily.   norgestimate-ethinyl estradiol (SPRINTEC 28) 0.25-35 MG-MCG tablet Take 1 tablet by mouth daily.   Nystatin POWD Apply liberally to affected area 2 times per day   spironolactone (ALDACTONE) 100 MG tablet Take 1 tablet (100 mg total) by mouth daily.   No facility-administered medications prior to visit.    ROS        Objective:     There were no vitals taken for this visit. {Vitals History (Optional):23777}  Physical Exam   No results found for any visits on 02/15/22. {Show previous labs (optional):23779}    Assessment & Plan:    Routine Health Maintenance and Physical Exam  Immunization History  Administered Date(s) Administered   DTaP 07/19/1999, 09/14/1999,  11/23/1999, 08/08/2000, 02/22/2004   Hepatitis A 12/19/2007, 12/24/2008   Hepatitis B 05/06/1999, 06/13/1999, 11/23/1999   HiB (PRP-OMP) 07/19/1999, 09/14/1999, 11/23/1999, 08/08/2000   IPV 07/19/1999, 09/14/1999, 05/13/2000, 02/22/2004   Influenza,inj,Quad PF,6+ Mos 03/26/2014   Influenza-Unspecified 06/25/2012   MMR 05/13/2001, 02/22/2004   Meningococcal Polysaccharide 12/24/2011   Pneumococcal Conjugate-13 08/08/2000   Pneumococcal-Unspecified 11/23/1999, 02/06/2000   Tdap 12/24/2011   Varicella 05/13/2000, 12/19/2007    Health Maintenance  Topic Date Due   HIV Screening  Never done   Hepatitis C Screening  Never done   INFLUENZA VACCINE  02/13/2022   PAP-Cervical Cytology Screening  02/15/2022 (Originally 05/04/2020)   PAP SMEAR-Modifier  02/15/2022 (Originally 05/04/2020)   TETANUS/TDAP  02/15/2022 (Originally 12/23/2021)   HPV VACCINES  Discontinued   COVID-19 Vaccine  Discontinued    Discussed health benefits of physical activity, and encouraged her to engage in regular exercise appropriate for her age and condition.  Problem List Items Addressed This Visit   None Visit Diagnoses     Annual physical exam    -  Primary   Cervical cancer screening       Need for Tdap vaccination          No follow-ups on file.     Joy Jessup, NP   

## 2021-01-02 ENCOUNTER — Other Ambulatory Visit: Payer: Self-pay

## 2021-01-02 ENCOUNTER — Encounter: Payer: Self-pay | Admitting: Student

## 2021-01-02 ENCOUNTER — Other Ambulatory Visit: Payer: Self-pay | Admitting: Internal Medicine

## 2021-01-02 ENCOUNTER — Ambulatory Visit (INDEPENDENT_AMBULATORY_CARE_PROVIDER_SITE_OTHER): Payer: Medicare Other | Admitting: Student

## 2021-01-02 VITALS — BP 120/80 | HR 56 | Ht 64.0 in | Wt 165.2 lb

## 2021-01-02 DIAGNOSIS — I10 Essential (primary) hypertension: Secondary | ICD-10-CM | POA: Diagnosis not present

## 2021-01-02 DIAGNOSIS — I480 Paroxysmal atrial fibrillation: Secondary | ICD-10-CM | POA: Diagnosis not present

## 2021-01-02 DIAGNOSIS — I441 Atrioventricular block, second degree: Secondary | ICD-10-CM

## 2021-01-02 LAB — CUP PACEART INCLINIC DEVICE CHECK
Battery Remaining Longevity: 149 mo
Battery Voltage: 3.03 V
Brady Statistic AP VP Percent: 8.35 %
Brady Statistic AP VS Percent: 0.13 %
Brady Statistic AS VP Percent: 35.42 %
Brady Statistic AS VS Percent: 56.11 %
Brady Statistic RA Percent Paced: 11.6 %
Brady Statistic RV Percent Paced: 43.17 %
Date Time Interrogation Session: 20220620101831
Implantable Lead Implant Date: 20201211
Implantable Lead Implant Date: 20201211
Implantable Lead Location: 753859
Implantable Lead Location: 753860
Implantable Lead Model: 5076
Implantable Lead Model: 5076
Implantable Pulse Generator Implant Date: 20201211
Lead Channel Impedance Value: 361 Ohm
Lead Channel Impedance Value: 475 Ohm
Lead Channel Impedance Value: 532 Ohm
Lead Channel Impedance Value: 551 Ohm
Lead Channel Pacing Threshold Amplitude: 0.75 V
Lead Channel Pacing Threshold Pulse Width: 0.4 ms
Lead Channel Sensing Intrinsic Amplitude: 3.75 mV
Lead Channel Sensing Intrinsic Amplitude: 4.625 mV
Lead Channel Sensing Intrinsic Amplitude: 5.5 mV
Lead Channel Sensing Intrinsic Amplitude: 8.25 mV
Lead Channel Setting Pacing Amplitude: 2 V
Lead Channel Setting Pacing Amplitude: 2.5 V
Lead Channel Setting Pacing Pulse Width: 0.4 ms
Lead Channel Setting Sensing Sensitivity: 1.2 mV

## 2021-01-02 NOTE — Patient Instructions (Signed)
Medication Instructions:  Your physician recommends that you continue on your current medications as directed. Please refer to the Current Medication list given to you today.  *If you need a refill on your cardiac medications before your next appointment, please call your pharmacy*   Lab Work: None If you have labs (blood work) drawn today and your tests are completely normal, you will receive your results only by: [place] (if you have MyChart) OR A paper copy in the mail If you have any lab test that is abnormal or we need to change your treatment, we will call you to review the results.   Follow-Up: At [facility], you and your health needs are our priority.  As part of our continuing mission to provide you with exceptional heart care, we have created designated Provider Care Teams.  These Care Teams include your primary Cardiologist (physician) and Advanced Practice Providers (APPs -  Physician Assistants and Nurse Practitioners) who all work together to provide you with the care you need, when you need it.  We recommend signing up for the patient portal called "MyChart".  Sign up information is provided on this After Visit Summary.  MyChart is used to connect with patients for Virtual Visits (Telemedicine).  Patients are able to view lab/test results, encounter notes, upcoming appointments, etc.  Non-urgent messages can be sent to your provider as well.   To learn more about what you can do with MyChart, go to [email or web address].    Your next appointment:   1 year(s)  The format for your next appointment:   In Person  Provider:   You may see Thompson Grayer, MD or one of the following Advanced Practice Providers on your designated Care Team:   Tommye Standard, [place] Legrand Como "[facility]" [place], [place]

## 2021-01-02 NOTE — Progress Notes (Signed)
Electrophysiology Office Note Date: 01/02/2021  ID:  Lori Wilson, Lori Wilson [birth year], MRN [id number]  PCP: Isaac Bliss, Rayford Halsted, MD Primary Cardiologist: Jenkins Rouge, MD Electrophysiologist: Thompson Grayer, MD   CC: Pacemaker follow-up  Lori Wilson is a 86 y.o. female seen today for Thompson Grayer, MD for routine electrophysiology followup.  Since last being seen in our clinic the patient reports doing very well from a cardiac perspective. She has noticed issues with her balance over the past few months. She has had no falls. She is considering using cane vs WC.  she denies chest pain, palpitations, dyspnea, PND, orthopnea, nausea, vomiting, dizziness, syncope, edema, weight gain, or early satiety.  Device History: Medtronic Dual Chamber PPM implanted 06/26/2019 for second degree AV block  Past Medical History:  Diagnosis Date   Dizziness 08/12/2016   Giant cell arteritis ([place]) 08/13/2016   High cholesterol    Hypertension    Pneumonia    Second degree Mobitz II AV block    Tremor 02/24/2014   Vertigo 08/12/2016   Past Surgical History:  Procedure Laterality Date   BLADDER SURGERY     catract Left 04/06/2014   PACEMAKER IMPLANT N/A 06/26/2019   Procedure: PACEMAKER IMPLANT;  Surgeon: Thompson Grayer, MD;  Location: [facility];  Service: Cardiovascular;  Laterality: N/A;   VAGINAL HYSTERECTOMY      Current Outpatient Medications  Medication Sig Dispense Refill   acetaminophen (TYLENOL) 325 MG tablet Take 650 mg by mouth every 6 (six) hours as needed for mild pain.     allopurinol (ZYLOPRIM) 100 MG tablet TAKE 1 TABLET(100 MG) BY MOUTH DAILY 90 tablet 1   amLODipine (NORVASC) 5 MG tablet TAKE 1 TABLET(5 MG) BY MOUTH DAILY 90 tablet 1   apixaban (ELIQUIS) 5 MG TABS tablet TAKE 1 TABLET(5 MG) BY MOUTH TWICE DAILY 120 tablet 3   atorvastatin (LIPITOR) 20 MG tablet TAKE 1 TABLET(20 MG) BY MOUTH DAILY 90 tablet 1   dorzolamide (TRUSOPT) 2 % ophthalmic solution Place  1 drop into the left eye 2 (two) times daily.      furosemide (LASIX) 20 MG tablet TAKE 1/2 TABLET(10 MG) BY MOUTH DAILY 45 tablet 2   meclizine (ANTIVERT) 12.5 MG tablet Take 12.5 mg by mouth 3 (three) times daily as needed for dizziness.     omeprazole (PRILOSEC) 20 MG capsule Take 20 mg by mouth every other day.     potassium chloride (KLOR-CON) 10 MEQ tablet TAKE 1 TABLET(10 MEQ) BY MOUTH DAILY 90 tablet 1   triamcinolone (KENALOG) 0.1 % paste as needed.     No current facility-administered medications for this visit.    Allergies:   Patient has no known allergies.   Social History: Social History   Socioeconomic History   Marital status: Widowed    Spouse name: Not on file   Number of children: 4   Years of education: 12   Highest education level: Not on file  Occupational History    Employer: RETIRED    Comment: Retired  Tobacco Use   Smoking status: Never   Smokeless tobacco: Never  Vaping Use   Vaping Use: Never used  Substance and Sexual Activity   Alcohol use: No    Alcohol/week: 0.0 standard drinks   Drug use: No   Sexual activity: Not on file  Other Topics Concern   Not on file  Social History Narrative   Patient is widowed and her grandson stay with her.  Retired.   Education high school.   Right handed.   Caffeine coffee one cup daily.   Social Determinants of Health   Financial Resource Strain: Not on file  Food Insecurity: Not on file  Transportation Needs: Not on file  Physical Activity: Not on file  Stress: Not on file  Social Connections: Not on file  Intimate Partner Violence: Not on file    Family History: Family History  Problem Relation Age of Onset   Heart attack Mother    Diabetes Mother    Stroke Father    High blood pressure Father      Review of Systems: All other systems reviewed and are otherwise negative except as noted above.  Physical Exam: Vitals:   01/02/21 0936  BP: 120/80  Pulse: (!) 56  SpO2: 95%  Weight:  165 lb 3.2 oz (74.9 kg)  Height: 5\' 4"  (1.626 m)     GEN- The patient is well appearing, alert and oriented x 3 today.   HEENT: normocephalic, atraumatic; sclera clear, conjunctiva pink; hearing intact; oropharynx clear; neck supple  Lungs- Clear to ausculation bilaterally, normal work of breathing.  No wheezes, rales, rhonchi Heart- Regular rate and rhythm, no murmurs, rubs or gallops  GI- soft, non-tender, non-distended, bowel sounds present  Extremities- no clubbing or cyanosis. No edema MS- no significant deformity or atrophy Skin- warm and dry, no rash or lesion; PPM pocket well healed Psych- euthymic mood, full affect Neuro- strength and sensation are intact  PPM Interrogation- reviewed in detail today,  See PACEART report  EKG:  EKG is ordered today. The ekg ordered today shows sinus brady at 56 bpm  Recent Labs: 03/01/2020: Hemoglobin 11.4; Platelets 277 09/09/2020: ALT 14; BUN 20; Creatinine, Ser 1.12; Potassium 4.3; Sodium 136   Wt Readings from Last 3 Encounters:  01/02/21 165 lb 3.2 oz (74.9 kg)  09/01/20 162 lb (73.5 kg)  06/15/20 169 lb 9.6 oz (76.9 kg)     Other studies Reviewed: Additional studies/ records that were reviewed today include: Previous EP office notes, Previous remote checks, Most recent labwork.   Assessment and Plan:  1. Symptomatic bradycardia due to second degree AV block s/p Medtronic PPM  Normal PPM function See Pace Art report No changes today  2. PAF Continue eliquis for CHA2DS2VASC of at least 4 Burden 1.5% by device Labs 09/09/20 stable.   3. HTN Continue current regimen   4. HLD Per PCP  5. Imbalance She is pending PCP follow up I encouraged her not to confine herself to a wheelchair at this point as she would likely need it moving forward if she allows her weakness to progress. She is able to walk without issue with a cane. Encouraged a 4-pronged cane, removal of tripping hazards around the house, night lights (especially  between her bed and bathroom), and close PCP follow up as planned.  Current medicines are reviewed at length with the patient today.   The patient does not have concerns regarding her medicines.  The following changes were made today:  none  Labs/ tests ordered today include:  Orders Placed This Encounter  Procedures   EKG 12-Lead  Labs from 08/2020 reviewed    Disposition:   Follow up with Dr. Rayann Heman or EP APP in 12 Months    Signed, Annamaria Helling  01/02/2021 9:51 AM  [facility] [address] [place] [address] [phone] (office) [phone] (fax)

## 2021-01-10 NOTE — Progress Notes (Signed)
Remote pacemaker transmission.   

## 2021-01-12 DIAGNOSIS — M150 Primary generalized (osteo)arthritis: Secondary | ICD-10-CM | POA: Diagnosis not present

## 2021-01-12 DIAGNOSIS — M25569 Pain in unspecified knee: Secondary | ICD-10-CM | POA: Diagnosis not present

## 2021-01-12 DIAGNOSIS — R768 Other specified abnormal immunological findings in serum: Secondary | ICD-10-CM | POA: Diagnosis not present

## 2021-01-12 DIAGNOSIS — M1009 Idiopathic gout, multiple sites: Secondary | ICD-10-CM | POA: Diagnosis not present

## 2021-01-12 DIAGNOSIS — M316 Other giant cell arteritis: Secondary | ICD-10-CM | POA: Diagnosis not present

## 2021-01-12 DIAGNOSIS — M549 Dorsalgia, unspecified: Secondary | ICD-10-CM | POA: Diagnosis not present

## 2021-01-23 ENCOUNTER — Other Ambulatory Visit: Payer: Self-pay

## 2021-01-23 ENCOUNTER — Other Ambulatory Visit: Payer: Self-pay | Admitting: Internal Medicine

## 2021-02-09 DIAGNOSIS — H402223 Chronic angle-closure glaucoma, left eye, severe stage: Secondary | ICD-10-CM | POA: Diagnosis not present

## 2021-02-09 DIAGNOSIS — H402211 Chronic angle-closure glaucoma, right eye, mild stage: Secondary | ICD-10-CM | POA: Diagnosis not present

## 2021-02-09 DIAGNOSIS — H40052 Ocular hypertension, left eye: Secondary | ICD-10-CM | POA: Diagnosis not present

## 2021-02-09 DIAGNOSIS — H35363 Drusen (degenerative) of macula, bilateral: Secondary | ICD-10-CM | POA: Diagnosis not present

## 2021-03-02 ENCOUNTER — Encounter: Payer: Self-pay | Admitting: Internal Medicine

## 2021-03-02 ENCOUNTER — Other Ambulatory Visit: Payer: Self-pay

## 2021-03-02 ENCOUNTER — Ambulatory Visit (INDEPENDENT_AMBULATORY_CARE_PROVIDER_SITE_OTHER): Payer: Medicare Other | Admitting: Internal Medicine

## 2021-03-02 VITALS — BP 135/70 | HR 72 | Temp 97.5°F | Ht 64.0 in | Wt 170.1 lb

## 2021-03-02 DIAGNOSIS — R202 Paresthesia of skin: Secondary | ICD-10-CM

## 2021-03-02 DIAGNOSIS — E559 Vitamin D deficiency, unspecified: Secondary | ICD-10-CM | POA: Diagnosis not present

## 2021-03-02 DIAGNOSIS — Z780 Asymptomatic menopausal state: Secondary | ICD-10-CM

## 2021-03-02 DIAGNOSIS — I480 Paroxysmal atrial fibrillation: Secondary | ICD-10-CM | POA: Diagnosis not present

## 2021-03-02 DIAGNOSIS — I10 Essential (primary) hypertension: Secondary | ICD-10-CM

## 2021-03-02 DIAGNOSIS — Z13820 Encounter for screening for osteoporosis: Secondary | ICD-10-CM | POA: Diagnosis not present

## 2021-03-02 DIAGNOSIS — E7800 Pure hypercholesterolemia, unspecified: Secondary | ICD-10-CM

## 2021-03-02 DIAGNOSIS — Z8679 Personal history of other diseases of the circulatory system: Secondary | ICD-10-CM | POA: Diagnosis not present

## 2021-03-02 DIAGNOSIS — R200 Anesthesia of skin: Secondary | ICD-10-CM

## 2021-03-02 DIAGNOSIS — Z0000 Encounter for general adult medical examination without abnormal findings: Secondary | ICD-10-CM

## 2021-03-02 LAB — COMPREHENSIVE METABOLIC PANEL
ALT: 13 U/L (ref 0–35)
AST: 20 U/L (ref 0–37)
Albumin: 3.7 g/dL (ref 3.5–5.2)
Alkaline Phosphatase: 64 U/L (ref 39–117)
BUN: 29 mg/dL — ABNORMAL HIGH (ref 6–23)
CO2: 21 mEq/L (ref 19–32)
Calcium: 9.4 mg/dL (ref 8.4–10.5)
Chloride: 107 mEq/L (ref 96–112)
Creatinine, Ser: 1.33 mg/dL — ABNORMAL HIGH (ref 0.40–1.20)
GFR: 36.94 mL/min — ABNORMAL LOW (ref >60.00)
Glucose, Bld: 82 mg/dL (ref 70–99)
Potassium: 4.4 mEq/L (ref 3.5–5.1)
Sodium: 136 mEq/L (ref 135–145)
Total Bilirubin: 0.6 mg/dL (ref 0.2–1.2)
Total Protein: 7.6 g/dL (ref 6.0–8.3)

## 2021-03-02 LAB — CBC WITH DIFFERENTIAL/PLATELET
Basophils Absolute: 0 10*3/uL (ref 0.0–0.1)
Basophils Relative: 0.5 % (ref 0.0–3.0)
Eosinophils Absolute: 0.2 10*3/uL (ref 0.0–0.7)
Eosinophils Relative: 3.9 % (ref 0.0–5.0)
HCT: 35 % — ABNORMAL LOW (ref 36.0–46.0)
Hemoglobin: 11.2 g/dL — ABNORMAL LOW (ref 12.0–15.0)
Lymphocytes Relative: 44.8 % (ref 12.0–46.0)
Lymphs Abs: 2.3 10*3/uL (ref 0.7–4.0)
MCHC: 32 g/dL (ref 30.0–36.0)
MCV: 91.6 fl (ref 78.0–100.0)
Monocytes Absolute: 0.6 10*3/uL (ref 0.1–1.0)
Monocytes Relative: 12.6 % — ABNORMAL HIGH (ref 3.0–12.0)
Neutro Abs: 2 10*3/uL (ref 1.4–7.7)
Neutrophils Relative %: 38.2 % — ABNORMAL LOW (ref 43.0–77.0)
Platelets: 247 10*3/uL (ref 150.0–400.0)
RBC: 3.82 Mil/uL — ABNORMAL LOW (ref 3.87–5.11)
RDW: 14.8 % (ref 11.5–15.5)
WBC: 5.1 10*3/uL (ref 4.0–10.5)

## 2021-03-02 LAB — LIPID PANEL
Cholesterol: 261 mg/dL — ABNORMAL HIGH (ref 0–200)
HDL: 50.8 mg/dL (ref >39.00)
NonHDL: 209.76
Total CHOL/HDL Ratio: 5
Triglycerides: 209 mg/dL — ABNORMAL HIGH (ref 0.0–149.0)
VLDL: 41.8 mg/dL — ABNORMAL HIGH (ref 0.0–40.0)

## 2021-03-02 LAB — VITAMIN D 25 HYDROXY (VIT D DEFICIENCY, FRACTURES): VITD: 19.28 ng/mL — ABNORMAL LOW (ref 30.00–100.00)

## 2021-03-02 LAB — LDL CHOLESTEROL, DIRECT: Direct LDL: 140 mg/dL

## 2021-03-02 LAB — VITAMIN B12: Vitamin B-12: 334 pg/mL (ref 211–911)

## 2021-03-02 NOTE — Patient Instructions (Signed)
-  Nice seeing you today!!  -Lab work today; will notify you once results are available.  -Remember to ger your COVID and tetanus boosters at the pharmacy.  -Schedule follow up in 6 months.

## 2021-03-02 NOTE — Progress Notes (Signed)
Established Patient Office Visit     This visit occurred during the SARS-CoV-2 public health emergency.  Safety protocols were in place, including screening questions prior to the visit, additional usage of staff PPE, and extensive cleaning of exam room while observing appropriate contact time as indicated for disinfecting solutions.    CC/Reason for Visit: Subsequent Medicare wellness visit and follow-up chronic medical conditions  HPI: Lori Wilson is a 86 y.o. female who is coming in today for the above mentioned reasons. Past Medical History is significant for: Hypertension, hyperlipidemia, gout second-degree heart block status post permanent pacemaker with paroxysmal atrial fibrillation, prior history of giant cell arteritis no longer on steroids.  She is feeling well and has no acute concerns today.  She has routine eye and dental care.  She has no perceived hearing issues, she exercises by walking on a daily basis.  She states her home blood pressure measurements are between [id number] and [id number] systolic with diastolics in the [id number].  She is overdue for her second COVID booster and her Tdap booster.  She had a colonoscopy in 2017 which was her last 1, she had a mammogram earlier this year.  She no longer does Pap smears.  She is overdue for a bone density test.   Past Medical/Surgical History: Past Medical History:  Diagnosis Date   Dizziness 08/12/2016   Giant cell arteritis ([place]) 08/13/2016   High cholesterol    Hypertension    Pneumonia    Second degree Mobitz II AV block    Tremor 02/24/2014   Vertigo 08/12/2016    Past Surgical History:  Procedure Laterality Date   BLADDER SURGERY     catract Left 04/06/2014   PACEMAKER IMPLANT N/A 06/26/2019   Procedure: PACEMAKER IMPLANT;  Surgeon: Thompson Grayer, MD;  Location: [facility];  Service: Cardiovascular;  Laterality: N/A;   VAGINAL HYSTERECTOMY      Social History:  reports that she has never smoked. She has never used  smokeless tobacco. She reports that she does not drink alcohol and does not use drugs.  Allergies: No Known Allergies  Family History:  Family History  Problem Relation Age of Onset   Heart attack Mother    Diabetes Mother    Stroke Father    High blood pressure Father      Current Outpatient Medications:    acetaminophen (TYLENOL) 325 MG tablet, Take 650 mg by mouth every 6 (six) hours as needed for mild pain., Disp: , Rfl:    allopurinol (ZYLOPRIM) 100 MG tablet, TAKE 1 TABLET(100 MG) BY MOUTH DAILY, Disp: 90 tablet, Rfl: 1   amLODipine (NORVASC) 5 MG tablet, TAKE 1 TABLET(5 MG) BY MOUTH DAILY, Disp: 90 tablet, Rfl: 1   apixaban (ELIQUIS) 5 MG TABS tablet, TAKE 1 TABLET(5 MG) BY MOUTH TWICE DAILY, Disp: 120 tablet, Rfl: 3   atorvastatin (LIPITOR) 20 MG tablet, TAKE 1 TABLET(20 MG) BY MOUTH DAILY, Disp: 90 tablet, Rfl: 1   dorzolamide (TRUSOPT) 2 % ophthalmic solution, Place 1 drop into the left eye 2 (two) times daily. , Disp: , Rfl:    furosemide (LASIX) 20 MG tablet, TAKE 1/2 TABLET(10 MG) BY MOUTH DAILY, Disp: 45 tablet, Rfl: 2   meclizine (ANTIVERT) 12.5 MG tablet, Take 12.5 mg by mouth 3 (three) times daily as needed for dizziness., Disp: , Rfl:    omeprazole (PRILOSEC) 20 MG capsule, Take 20 mg by mouth every other day., Disp: , Rfl:    potassium chloride (  KLOR-CON) 10 MEQ tablet, TAKE 1 TABLET(10 MEQ) BY MOUTH DAILY, Disp: 90 tablet, Rfl: 1   triamcinolone (KENALOG) 0.1 % paste, as needed., Disp: , Rfl:   Review of Systems:  Constitutional: Denies fever, chills, diaphoresis, appetite change and fatigue.  HEENT: Denies photophobia, eye pain, redness, hearing loss, ear pain, congestion, sore throat, rhinorrhea, sneezing, mouth sores, trouble swallowing, neck pain, neck stiffness and tinnitus.   Respiratory: Denies SOB, DOE, cough, chest tightness,  and wheezing.   Cardiovascular: Denies chest pain, palpitations and leg swelling.  Gastrointestinal: Denies nausea, vomiting,  abdominal pain, diarrhea, constipation, blood in stool and abdominal distention.  Genitourinary: Denies dysuria, urgency, frequency, hematuria, flank pain and difficulty urinating.  Endocrine: Denies: hot or cold intolerance, sweats, changes in hair or nails, polyuria, polydipsia. Musculoskeletal: Denies myalgias, back pain, joint swelling, arthralgias and gait problem.  Skin: Denies pallor, rash and wound.  Neurological: Denies dizziness, seizures, syncope, weakness, light-headedness, numbness and headaches.  Hematological: Denies adenopathy. Easy bruising, personal or family bleeding history  Psychiatric/Behavioral: Denies suicidal ideation, mood changes, confusion, nervousness, sleep disturbance and agitation    Physical Exam: Vitals:   03/02/21 0915  BP: 135/70  Pulse: 72  Temp: (!) 97.5 F (36.4 C)  TempSrc: Oral  SpO2: 99%  Weight: 170 lb 1.6 oz (77.2 kg)  Height: '5\' 4"'$  (1.626 m)    Body mass index is 29.2 kg/m.   Constitutional: NAD, calm, comfortable Eyes: PERRL, lids and conjunctivae normal ENMT: Mucous membranes are moist. Posterior pharynx clear of any exudate or lesions. Normal dentition. Tympanic membrane is pearly white, no erythema or bulging. Neck: normal, supple, no masses, no thyromegaly Respiratory: clear to auscultation bilaterally, no wheezing, no crackles. Normal respiratory effort. No accessory muscle use.  Cardiovascular: Regular rate and rhythm, no murmurs / rubs / gallops. No extremity edema. 2+ pedal pulses. No carotid bruits.  Abdomen: no tenderness, no masses palpated. No hepatosplenomegaly. Bowel sounds positive.  Musculoskeletal: no clubbing / cyanosis. No joint deformity upper and lower extremities. Good ROM, no contractures. Normal muscle tone.  Skin: no rashes, lesions, ulcers. No induration Neurologic: CN 2-12 grossly intact. Sensation intact, DTR normal. Strength 5/5 in all 4.  Psychiatric: Normal judgment and insight. Alert and oriented x 3.  Normal mood.    Subsequent Medicare wellness visit   1. Risk factors, based on past  M,S,F -cardiovascular disease risk factors include age, history of hypertension and hyperlipidemia   2.  Physical activities: Walks daily   3.  Depression/mood: Stable, not depressed   4.  Hearing: No perceived issues   5.  ADL's: Independent in all ADLs   6.  Fall risk: Low fall risk   7.  Home safety: No problems identified   8.  Height weight, and visual acuity: height and weight as above, vision:  Vision Screening   Right eye Left eye Both eyes  Without correction '20/30 20/25 20/25 '$  With correction        9.  Counseling: Advised to update her immunization status at the pharmacy   10. Lab orders based on risk factors: Laboratory update will be reviewed   11. Referral : None today   12. Care plan: Follow-up with me in 6 months   13. Cognitive assessment: No cognitive impairment   14. Screening: Patient provided with a written and personalized 5-10 year screening schedule in the AVS. yes   15. Provider List Update: PCP, cardiologist  16. Advance Directives: Full code  17. Opioids: Patient is not on  any opioid prescriptions and has no risk factors for a substance use disorder.   [facility] Office Visit from 03/02/2021 in [facility]  PHQ-9 Total Score 0       Fall Risk  03/02/2021 03/01/2020 08/28/2019 07/02/2018 11/18/2017  Falls in the past year? 0 0 0 1 No  Comment - - - - -  Number falls in past yr: 0 0 0 0 -  Injury with Fall? 0 0 0 0 -  Follow up - - Falls evaluation completed - -     Impression and Plan:  Encounter for preventive health examination -She has routine eye and dental care. -She is due for her second COVID booster and Tdap booster which she will obtain at pharmacy. -Screening labs today. -Healthy lifestyle discussed in detail. -She had a colonoscopy in 2017 and no further were recommended. -She no longer does cervical cancer  screening due to age. -She had a negative mammogram in April 2022. -She is due for bone density test.  Primary hypertension  - Plan: CBC with Differential/Platelet, Comprehensive metabolic panel, Lipid panel, Lipid panel, Comprehensive metabolic panel, CBC with Differential/Platelet -Blood pressure is elevated in office today, however home measurements are well within range.  High cholesterol -Check lipids today. -She is on atorvastatin 20 mg daily. -Last lipids in August 2021 with a total cholesterol of 207, triglycerides 209 and LDL 196.  PAF (paroxysmal atrial fibrillation) (HCC) History of second degree heart block -Followed by cardiology, status post permanent pacemaker, anticoagulated on Eliquis.  Numbness and tingling of foot  - Plan: Vitamin B12, Vitamin B12  Vitamin D deficiency  - Plan: VITAMIN D 25 Hydroxy (Vit-D Deficiency, Fractures),   Encounter for osteoporosis screening in asymptomatic postmenopausal patient  - Plan: DG Bone Density   Patient Instructions  -Nice seeing you today!!  -Lab work today; will notify you once results are available.  -Remember to ger your COVID and tetanus boosters at the pharmacy.  -Schedule follow up in 6 months.   Lelon Frohlich, MD [facility]

## 2021-03-07 ENCOUNTER — Other Ambulatory Visit: Payer: Self-pay

## 2021-03-07 ENCOUNTER — Other Ambulatory Visit: Payer: Self-pay | Admitting: Internal Medicine

## 2021-03-07 ENCOUNTER — Encounter: Payer: Self-pay | Admitting: Internal Medicine

## 2021-03-07 ENCOUNTER — Ambulatory Visit (INDEPENDENT_AMBULATORY_CARE_PROVIDER_SITE_OTHER)
Admission: RE | Admit: 2021-03-07 | Discharge: 2021-03-07 | Disposition: A | Discharge status: Home / Self Care | Payer: Medicare Other | Source: Ambulatory Visit | Attending: Internal Medicine | Admitting: Internal Medicine

## 2021-03-07 DIAGNOSIS — E559 Vitamin D deficiency, unspecified: Secondary | ICD-10-CM

## 2021-03-07 DIAGNOSIS — N183 Chronic kidney disease, stage 3 unspecified: Secondary | ICD-10-CM | POA: Insufficient documentation

## 2021-03-07 DIAGNOSIS — Z780 Asymptomatic menopausal state: Secondary | ICD-10-CM | POA: Diagnosis not present

## 2021-03-07 DIAGNOSIS — D638 Anemia in other chronic diseases classified elsewhere: Secondary | ICD-10-CM | POA: Insufficient documentation

## 2021-03-07 DIAGNOSIS — E7800 Pure hypercholesterolemia, unspecified: Secondary | ICD-10-CM

## 2021-03-07 DIAGNOSIS — Z13820 Encounter for screening for osteoporosis: Secondary | ICD-10-CM

## 2021-03-07 MED ORDER — VITAMIN D (ERGOCALCIFEROL) 1.25 MG (50000 UNIT) PO CAPS
50000.0000 [IU] | ORAL_CAPSULE | ORAL | 0 refills | Status: AC
Start: 2021-03-07 — End: 2021-05-24

## 2021-03-07 MED ORDER — ATORVASTATIN CALCIUM 40 MG PO TABS: 40.0000 mg | ORAL_TABLET | Freq: Every day | ORAL | 1 refills | Status: DC

## 2021-03-09 ENCOUNTER — Other Ambulatory Visit: Payer: Self-pay | Admitting: Internal Medicine

## 2021-03-09 DIAGNOSIS — E7800 Pure hypercholesterolemia, unspecified: Secondary | ICD-10-CM

## 2021-03-09 DIAGNOSIS — E559 Vitamin D deficiency, unspecified: Secondary | ICD-10-CM

## 2021-03-24 ENCOUNTER — Ambulatory Visit (INDEPENDENT_AMBULATORY_CARE_PROVIDER_SITE_OTHER): Payer: Medicare Other

## 2021-03-24 DIAGNOSIS — I441 Atrioventricular block, second degree: Secondary | ICD-10-CM

## 2021-03-24 LAB — CUP PACEART REMOTE DEVICE CHECK
Battery Remaining Longevity: 144 mo
Battery Voltage: 3.02 V
Brady Statistic AP VP Percent: 9.48 %
Brady Statistic AP VS Percent: 0.22 %
Brady Statistic AS VP Percent: 35.88 %
Brady Statistic AS VS Percent: 54.43 %
Brady Statistic RA Percent Paced: 13.38 %
Brady Statistic RV Percent Paced: 44.74 %
Date Time Interrogation Session: 20220909050631
Implantable Lead Implant Date: 20201211
Implantable Lead Implant Date: 20201211
Implantable Lead Location: 753859
Implantable Lead Location: 753860
Implantable Lead Model: 5076
Implantable Lead Model: 5076
Implantable Pulse Generator Implant Date: 20201211
Lead Channel Impedance Value: 323 Ohm
Lead Channel Impedance Value: 399 Ohm
Lead Channel Impedance Value: 418 Ohm
Lead Channel Impedance Value: 475 Ohm
Lead Channel Pacing Threshold Amplitude: 0.75 V
Lead Channel Pacing Threshold Pulse Width: 0.4 ms
Lead Channel Sensing Intrinsic Amplitude: 3.875 mV
Lead Channel Sensing Intrinsic Amplitude: 3.875 mV
Lead Channel Sensing Intrinsic Amplitude: 5.25 mV
Lead Channel Sensing Intrinsic Amplitude: 5.25 mV
Lead Channel Setting Pacing Amplitude: 2 V
Lead Channel Setting Pacing Amplitude: 2.5 V
Lead Channel Setting Pacing Pulse Width: 0.4 ms
Lead Channel Setting Sensing Sensitivity: 1.2 mV

## 2021-03-30 NOTE — Progress Notes (Signed)
Remote pacemaker transmission.   

## 2021-05-04 ENCOUNTER — Other Ambulatory Visit: Payer: Self-pay | Admitting: Internal Medicine

## 2021-05-13 ENCOUNTER — Other Ambulatory Visit: Payer: Self-pay | Admitting: Internal Medicine

## 2021-05-15 NOTE — Telephone Encounter (Signed)
Eliquis 5mg  refill request received. Patient is 86 years old, weight-77.2kg, Crea-1.33 on 03/02/2021, Diagnosis-Afib, and last seen by [place], [place] on 01/02/21. Dose is appropriate based on dosing criteria. Will send in refill to requested pharmacy.

## 2021-06-13 ENCOUNTER — Other Ambulatory Visit (INDEPENDENT_AMBULATORY_CARE_PROVIDER_SITE_OTHER): Payer: Medicare Other

## 2021-06-13 DIAGNOSIS — E559 Vitamin D deficiency, unspecified: Secondary | ICD-10-CM

## 2021-06-13 DIAGNOSIS — E7800 Pure hypercholesterolemia, unspecified: Secondary | ICD-10-CM

## 2021-06-13 LAB — LIPID PANEL
Cholesterol: 274 mg/dL — ABNORMAL HIGH (ref 0–200)
HDL: 47 mg/dL (ref >39.00)
NonHDL: 227.07
Total CHOL/HDL Ratio: 6
Triglycerides: 270 mg/dL — ABNORMAL HIGH (ref 0.0–149.0)
VLDL: 54 mg/dL — ABNORMAL HIGH (ref 0.0–40.0)

## 2021-06-13 LAB — LDL CHOLESTEROL, DIRECT: Direct LDL: 131 mg/dL

## 2021-06-23 ENCOUNTER — Ambulatory Visit (INDEPENDENT_AMBULATORY_CARE_PROVIDER_SITE_OTHER): Payer: Medicare Other

## 2021-06-23 DIAGNOSIS — I441 Atrioventricular block, second degree: Secondary | ICD-10-CM

## 2021-06-23 LAB — CUP PACEART REMOTE DEVICE CHECK
Battery Remaining Longevity: 140 mo
Battery Voltage: 3.02 V
Brady Statistic AP VP Percent: 8.54 %
Brady Statistic AP VS Percent: 0.14 %
Brady Statistic AS VP Percent: 40.27 %
Brady Statistic AS VS Percent: 51.07 %
Brady Statistic RA Percent Paced: 11.82 %
Brady Statistic RV Percent Paced: 47.61 %
Date Time Interrogation Session: 20221208224946
Implantable Lead Implant Date: 20201211
Implantable Lead Implant Date: 20201211
Implantable Lead Location: 753859
Implantable Lead Location: 753860
Implantable Lead Model: 5076
Implantable Lead Model: 5076
Implantable Pulse Generator Implant Date: 20201211
Lead Channel Impedance Value: 304 Ohm
Lead Channel Impedance Value: 380 Ohm
Lead Channel Impedance Value: 399 Ohm
Lead Channel Impedance Value: 437 Ohm
Lead Channel Pacing Threshold Amplitude: 0.75 V
Lead Channel Pacing Threshold Pulse Width: 0.4 ms
Lead Channel Sensing Intrinsic Amplitude: 4.25 mV
Lead Channel Sensing Intrinsic Amplitude: 4.25 mV
Lead Channel Sensing Intrinsic Amplitude: 5.25 mV
Lead Channel Sensing Intrinsic Amplitude: 5.25 mV
Lead Channel Setting Pacing Amplitude: 2 V
Lead Channel Setting Pacing Amplitude: 2.5 V
Lead Channel Setting Pacing Pulse Width: 0.4 ms
Lead Channel Setting Sensing Sensitivity: 1.2 mV

## 2021-07-03 NOTE — Progress Notes (Signed)
Remote pacemaker transmission.   

## 2021-07-05 ENCOUNTER — Encounter: Payer: Self-pay | Admitting: Internal Medicine

## 2021-07-08 ENCOUNTER — Other Ambulatory Visit: Payer: Self-pay | Admitting: Internal Medicine

## 2021-07-08 DIAGNOSIS — I10 Essential (primary) hypertension: Secondary | ICD-10-CM

## 2021-07-11 NOTE — Progress Notes (Deleted)
Patient ID: Lori Wilson MRN: [id number]; DOB: [birth year]  Admit date: (Not on file) Date of Consult: 07/11/2021  Primary Care Provider: Isaac Bliss, Rayford Halsted, MD Primary Cardiologist: [facility] [place] Primary Electrophysiologist:  [place]    History of Present Illness:   Ms. Hagwood 86 y.o. frst seen 11/07/18  CRF;s HTN and HLD history of gout , vertigo and pneumonia. Pevious giant cell arteritis now off prednisone Seen by primary 09/23/27 with 2 week complaint of palpitations. Heart beating in her neck Occurs daily Gets fatigue and needs to rest No associated syncope dyspnea or chest pain Pulse was 72 in office with NSR on ECG Echo done 08/12/16 for dizziness was normal with EF 60-65% just mild AR and aortic root 4.2 cm Carotid done for same reason plaque no stenosis in ICA;s   She is widowed over 48 years Raised 4 kids with youngest son living with her   Monitor reviewed 12/26/18 with rare PAF rates 150 but frequent episodes of 2:1 AV block Had Medtronic dual chamber PPM placed 06/26/19   PACEART has shown normal function  PAF burden 2.7%   She has pain and paraesthesias in left shoulder still but some of it sounds like arthritis Balance issues needing cane/walker no falls   ***  Past Medical History:  Diagnosis Date   Dizziness 08/12/2016   Giant cell arteritis ([place]) 08/13/2016   High cholesterol    Hypertension    Pneumonia    Second degree Mobitz II AV block    Tremor 02/24/2014   Vertigo 08/12/2016    Past Surgical History:  Procedure Laterality Date   BLADDER SURGERY     catract Left 04/06/2014   PACEMAKER IMPLANT N/A 06/26/2019   Procedure: PACEMAKER IMPLANT;  Surgeon: Thompson Grayer, MD;  Location: [facility];  Service: Cardiovascular;  Laterality: N/A;   VAGINAL HYSTERECTOMY       Home Medications:  Prior to Admission medications   Medication Sig Start Date End Date Taking? Authorizing Provider  acetaminophen (TYLENOL) 325 MG tablet Take 650 mg by mouth  every 6 (six) hours as needed for mild pain.    [provider]  allopurinol (ZYLOPRIM) 100 MG tablet Take 1 tablet (100 mg total) by mouth daily. 07/02/18   Isaac Bliss, Rayford Halsted, MD  amLODipine (NORVASC) 5 MG tablet Take 1 tablet (5 mg total) by mouth daily. 07/02/18   Isaac Bliss, Rayford Halsted, MD  aspirin 81 MG tablet Take 81 mg by mouth daily.    [provider]  atorvastatin (LIPITOR) 10 MG tablet Take 1 tablet (10 mg total) by mouth daily. Patient not taking: Reported on 09/23/2018 07/02/18   Isaac Bliss, Rayford Halsted, MD  dorzolamide (TRUSOPT) 2 % ophthalmic solution Place 1 drop into the left eye 2 (two) times daily.     [provider]  furosemide (LASIX) 20 MG tablet Take 0.5 tablets (10 mg total) by mouth daily. 07/02/18   Isaac Bliss, Rayford Halsted, MD  hydroxypropyl methylcellulose / hypromellose (ISOPTO TEARS / GONIOVISC) 2.5 % ophthalmic solution Place 1 drop into both eyes 3 (three) times daily as needed for dry eyes.    [provider]  meclizine (ANTIVERT) 12.5 MG tablet Take 1 tablet (12.5 mg total) by mouth 2 (two) times daily as needed for dizziness. 07/02/18   Isaac Bliss, Rayford Halsted, MD  potassium chloride (K-DUR) 10 MEQ tablet TAKE 1 TABLET(10 MEQ) BY MOUTH DAILY 08/14/18   Isaac Bliss, Rayford Halsted, MD    Inpatient Medications:  Scheduled Meds:  Continuous Infusions:  PRN Meds:   Allergies:   No Known Allergies  Social History:   Social History   Socioeconomic History   Marital status: Widowed    Spouse name: Not on file   Number of children: 4   Years of education: 12   Highest education level: Not on file  Occupational History    Employer: RETIRED    Comment: Retired  Tobacco Use   Smoking status: Never   Smokeless tobacco: Never  Vaping Use   Vaping Use: Never used  Substance and Sexual Activity   Alcohol use: No    Alcohol/week: 0.0 standard drinks   Drug use: No   Sexual activity: Not on file   Other Topics Concern   Not on file  Social History Narrative   Patient is widowed and her grandson stay with her.    Retired.   Education high school.   Right handed.   Caffeine coffee one cup daily.   Social Determinants of Health   Financial Resource Strain: Not on file  Food Insecurity: Not on file  Transportation Needs: Not on file  Physical Activity: Not on file  Stress: Not on file  Social Connections: Not on file  Intimate Partner Violence: Not on file    Family History:    Family History  Problem Relation Age of Onset   Heart attack Mother    Diabetes Mother    Stroke Father    High blood pressure Father      ROS:  Please see the history of present illness.   All other ROS reviewed and negative.     Physical Exam/Data:   There were no vitals filed for this visit.  @IOBRIEF @ Last 3 Weights 03/02/2021 01/02/2021 09/01/2020  Weight (lbs) 170 lb 1.6 oz 165 lb 3.2 oz 162 lb  Weight (kg) 77.157 kg 74.934 kg 73.483 kg     There is no height or weight on file to calculate BMI.   Affect appropriate Healthy:  appears stated age 61: normal Neck supple with no adenopathy JVP normal no bruits no thyromegaly Lungs clear with no wheezing and good diaphragmatic motion Heart:  S1/S2 no murmur, no rub, gallop or click PMI normal pacer under left clavicle  Abdomen: benighn, BS positve, no tenderness, no AAA no bruit.  No HSM or HJR Distal pulses intact with no bruits No edema Neuro non-focal Skin warm and dry No muscular weakness   ECG:  11/07/18 SR rate 65 PR 236 low voltage no acute ST changes   Relevant CV Studies: Echo 08/12/16 Carotid 08/13/16 [place] 12/26/18    Radiology/Studies:  No results found.  Assessment and Plan:   PAF:  Continue eliquis low burden stable  HTN: Well controlled.  Continue current medications and low sodium Dash type diet.   HLD on statin labs with primary  Vertigo has antivert had normal MRI of head 08/12/16 Arteritis: f/u  rheumatology off prednisone  Gout continue allopurinol quiescent  7.  PPM:  medtronic 06/26/19 implant f/u Dr Rayann Heman   F/U with Dr Rayann Heman and me in a year  Update echo for RV/LV EF and degree of TR post PPM   For questions or updates, please contact [facility] Please consult [email or web address] for contact info under     Signed, Jenkins Rouge, MD  07/11/2021 8:49 AM

## 2021-07-13 ENCOUNTER — Telehealth: Payer: Self-pay | Admitting: Internal Medicine

## 2021-07-13 MED ORDER — ATORVASTATIN CALCIUM 20 MG PO TABS: 20.0000 mg | ORAL_TABLET | Freq: Every day | ORAL | 3 refills | Status: DC

## 2021-07-13 NOTE — Telephone Encounter (Signed)
Pt is not currently taking her cholesterol medication.

## 2021-07-13 NOTE — Telephone Encounter (Signed)
Called pt and let her know Dr said she would like her to go back to lipitor 20 mg and we will recheck in 3 months.  Pt states she wasn't taking her medication because when she was given a list of her medications at her visit she didn't see it on there so she assumed to not take it.

## 2021-07-13 NOTE — Telephone Encounter (Signed)
Patient returned called, stated letter was for lab results as they were unable to reach her by phone. Patient has questions about  atorvastatin. Stated she was taking it in past, that she was taken off of it and is no longer taking it.     Please advise

## 2021-07-13 NOTE — Telephone Encounter (Signed)
Pt received a letter and is calling back . Pt has been taking her chole med atorvastatin 40 mg daily per pt

## 2021-07-19 ENCOUNTER — Ambulatory Visit: Payer: Medicare Other | Admitting: Cardiovascular Disease

## 2021-08-07 ENCOUNTER — Other Ambulatory Visit: Payer: Self-pay | Admitting: Internal Medicine

## 2021-08-08 NOTE — Telephone Encounter (Signed)
Pt confirms that she is still taking Potassium. F/u OV noted to be scheduled on 09/04/21.

## 2021-08-14 DIAGNOSIS — H402211 Chronic angle-closure glaucoma, right eye, mild stage: Secondary | ICD-10-CM | POA: Diagnosis not present

## 2021-08-14 DIAGNOSIS — H402223 Chronic angle-closure glaucoma, left eye, severe stage: Secondary | ICD-10-CM | POA: Diagnosis not present

## 2021-08-14 DIAGNOSIS — H40052 Ocular hypertension, left eye: Secondary | ICD-10-CM | POA: Diagnosis not present

## 2021-08-15 ENCOUNTER — Ambulatory Visit: Payer: Medicare PPO | Admitting: Internal Medicine

## 2021-08-15 VITALS — BP 130/76 | HR 78 | Temp 97.7°F | Wt 169.0 lb

## 2021-08-15 DIAGNOSIS — M26629 Arthralgia of temporomandibular joint, unspecified side: Secondary | ICD-10-CM | POA: Diagnosis not present

## 2021-08-15 MED ORDER — MELOXICAM 7.5 MG PO TABS: 7.5000 mg | ORAL_TABLET | Freq: Every day | ORAL | 0 refills | Status: AC

## 2021-08-15 NOTE — Progress Notes (Signed)
Established Patient Office Visit     This visit occurred during the SARS-CoV-2 public health emergency.  Safety protocols were in place, including screening questions prior to the visit, additional usage of staff PPE, and extensive cleaning of exam room while observing appropriate contact time as indicated for disinfecting solutions.    CC/Reason for Visit: Jaw pain  HPI: Lori Wilson is a 87 y.o. female who is coming in today for the above mentioned reasons.  For the past 3 days she has been having significant bilateral jaw pain and headache.  She is having difficulty chewing.  No recent URI symptoms.  Past Medical/Surgical History: Past Medical History:  Diagnosis Date   Dizziness 08/12/2016   Giant cell arteritis ([place]) 08/13/2016   High cholesterol    Hypertension    Pneumonia    Second degree Mobitz II AV block    Tremor 02/24/2014   Vertigo 08/12/2016    Past Surgical History:  Procedure Laterality Date   BLADDER SURGERY     catract Left 04/06/2014   PACEMAKER IMPLANT N/A 06/26/2019   Procedure: PACEMAKER IMPLANT;  Surgeon: Thompson Grayer, MD;  Location: [facility];  Service: Cardiovascular;  Laterality: N/A;   VAGINAL HYSTERECTOMY      Social History:  reports that she has never smoked. She has never used smokeless tobacco. She reports that she does not drink alcohol and does not use drugs.  Allergies: No Known Allergies  Family History:  Family History  Problem Relation Age of Onset   Heart attack Mother    Diabetes Mother    Stroke Father    High blood pressure Father      Current Outpatient Medications:    acetaminophen (TYLENOL) 325 MG tablet, Take 650 mg by mouth every 6 (six) hours as needed for mild pain., Disp: , Rfl:    allopurinol (ZYLOPRIM) 100 MG tablet, TAKE 1 TABLET(100 MG) BY MOUTH DAILY, Disp: 90 tablet, Rfl: 1   amLODipine (NORVASC) 5 MG tablet, TAKE 1 TABLET(5 MG) BY MOUTH DAILY, Disp: 90 tablet, Rfl: 1   apixaban (ELIQUIS) 5  MG TABS tablet, TAKE 1 TABLET(5 MG) BY MOUTH TWICE DAILY, Disp: 180 tablet, Rfl: 1   atorvastatin (LIPITOR) 20 MG tablet, Take 1 tablet (20 mg total) by mouth daily., Disp: 90 tablet, Rfl: 3   dorzolamide (TRUSOPT) 2 % ophthalmic solution, Place 1 drop into the left eye 2 (two) times daily. , Disp: , Rfl:    furosemide (LASIX) 20 MG tablet, TAKE 1/2 TABLET(10 MG) BY MOUTH DAILY, Disp: 45 tablet, Rfl: 2   meclizine (ANTIVERT) 12.5 MG tablet, Take 12.5 mg by mouth 3 (three) times daily as needed for dizziness., Disp: , Rfl:    meloxicam (MOBIC) 7.5 MG tablet, Take 1 tablet (7.5 mg total) by mouth daily for 10 days., Disp: 10 tablet, Rfl: 0   omeprazole (PRILOSEC) 20 MG capsule, Take 20 mg by mouth every other day., Disp: , Rfl:    potassium chloride (KLOR-CON) 10 MEQ tablet, TAKE 1 TABLET(10 MEQ) BY MOUTH DAILY, Disp: 90 tablet, Rfl: 0   triamcinolone (KENALOG) 0.1 % paste, as needed., Disp: , Rfl:   Review of Systems:  Constitutional: Denies fever, chills, diaphoresis, appetite change and fatigue.  HEENT: Denies photophobia, eye pain, redness, hearing loss, ear pain, congestion, sore throat, rhinorrhea, sneezing, mouth sores, trouble swallowing, neck pain, neck stiffness and tinnitus.   Respiratory: Denies SOB, DOE, cough, chest tightness,  and wheezing.   Cardiovascular: Denies chest  pain, palpitations and leg swelling.  Gastrointestinal: Denies nausea, vomiting, abdominal pain, diarrhea, constipation, blood in stool and abdominal distention.  Genitourinary: Denies dysuria, urgency, frequency, hematuria, flank pain and difficulty urinating.  Endocrine: Denies: hot or cold intolerance, sweats, changes in hair or nails, polyuria, polydipsia. Musculoskeletal: Denies myalgias, back pain, joint swelling, arthralgias and gait problem.  Skin: Denies pallor, rash and wound.  Neurological: Denies dizziness, seizures, syncope, weakness, light-headedness, numbness and headaches.  Hematological: Denies  adenopathy. Easy bruising, personal or family bleeding history  Psychiatric/Behavioral: Denies suicidal ideation, mood changes, confusion, nervousness, sleep disturbance and agitation    Physical Exam: Vitals:   08/15/21 1421  BP: 130/76  Pulse: 78  Temp: 97.7 F (36.5 C)  TempSrc: Oral  SpO2: 98%  Weight: 169 lb (76.7 kg)    Body mass index is 29.01 kg/m.   Constitutional: NAD, calm, comfortable Eyes: PERRL, lids and conjunctivae normal ENMT: Mucous membranes are moist.  Crepitus of jaw with chewing motion. Respiratory: clear to auscultation bilaterally, no wheezing, no crackles. Normal respiratory effort. No accessory muscle use.  Cardiovascular: Regular rate and rhythm, no murmurs / rubs / gallops. No extremity edema. Psychiatric: Normal judgment and insight. Alert and oriented x 3. Normal mood.    Impression and Plan:  TMJ pain dysfunction syndrome  - Plan: meloxicam (MOBIC) 7.5 MG tablet -If pain persists can consider gabapentin but hesitant to use due to her age. -Advised her to check in with her dentist as well.  Time spent: 31 minutes reviewing chart, interviewing and examining patient and formulating plan of care.   Patient Instructions  -Nice seeing you today!!  -Start meloxicam 7.5 mg daily for 10 days.  -Make sure you schedule follow up with your dentist.    Lelon Frohlich, MD [facility] at [facility]

## 2021-08-15 NOTE — Patient Instructions (Signed)
-  Nice seeing you today!!  -Start meloxicam 7.5 mg daily for 10 days.  -Make sure you schedule follow up with your dentist.

## 2021-09-04 ENCOUNTER — Ambulatory Visit (INDEPENDENT_AMBULATORY_CARE_PROVIDER_SITE_OTHER): Payer: Medicare Other | Admitting: Internal Medicine

## 2021-09-04 VITALS — BP 124/84 | HR 69 | Temp 98.9°F | Wt 168.4 lb

## 2021-09-04 DIAGNOSIS — D638 Anemia in other chronic diseases classified elsewhere: Secondary | ICD-10-CM | POA: Diagnosis not present

## 2021-09-04 DIAGNOSIS — E782 Mixed hyperlipidemia: Secondary | ICD-10-CM

## 2021-09-04 DIAGNOSIS — E559 Vitamin D deficiency, unspecified: Secondary | ICD-10-CM

## 2021-09-04 DIAGNOSIS — N1832 Chronic kidney disease, stage 3b: Secondary | ICD-10-CM | POA: Diagnosis not present

## 2021-09-04 DIAGNOSIS — I10 Essential (primary) hypertension: Secondary | ICD-10-CM | POA: Diagnosis not present

## 2021-09-04 LAB — COMPREHENSIVE METABOLIC PANEL
ALT: 14 U/L (ref 0–35)
AST: 23 U/L (ref 0–37)
Albumin: 4 g/dL (ref 3.5–5.2)
Alkaline Phosphatase: 57 U/L (ref 39–117)
BUN: 32 mg/dL — ABNORMAL HIGH (ref 6–23)
CO2: 24 mEq/L (ref 19–32)
Calcium: 9.9 mg/dL (ref 8.4–10.5)
Chloride: 106 mEq/L (ref 96–112)
Creatinine, Ser: 1.62 mg/dL — ABNORMAL HIGH (ref 0.40–1.20)
GFR: 29.05 mL/min — ABNORMAL LOW (ref >60.00)
Glucose, Bld: 92 mg/dL (ref 70–99)
Potassium: 4.2 mEq/L (ref 3.5–5.1)
Sodium: 135 mEq/L (ref 135–145)
Total Bilirubin: 0.6 mg/dL (ref 0.2–1.2)
Total Protein: 8.3 g/dL (ref 6.0–8.3)

## 2021-09-04 LAB — LIPID PANEL
Cholesterol: 193 mg/dL (ref 0–200)
HDL: 53.3 mg/dL (ref >39.00)
LDL Cholesterol: 111 mg/dL — ABNORMAL HIGH (ref 0–99)
NonHDL: 139.87
Total CHOL/HDL Ratio: 4
Triglycerides: 142 mg/dL (ref 0.0–149.0)
VLDL: 28.4 mg/dL (ref 0.0–40.0)

## 2021-09-04 NOTE — Patient Instructions (Signed)
-  Nice seeing you today!!  -Lab work today; will notify you once results are available.  -Schedule follow up in 6 months or sooner as needed.   

## 2021-09-04 NOTE — Progress Notes (Signed)
Established Patient Office Visit     This visit occurred during the SARS-CoV-2 public health emergency.  Safety protocols were in place, including screening questions prior to the visit, additional usage of staff PPE, and extensive cleaning of exam room while observing appropriate contact time as indicated for disinfecting solutions.    CC/Reason for Visit: 36-month follow-up chronic medical conditions  HPI: Lori Wilson is a 87 y.o. female who is coming in today for the above mentioned reasons. Past Medical History is significant for: Hypertension, hyperlipidemia, gout, history of second-degree heart block status post permanent pacemaker, atrial fibrillation, she has had giant cell arteritis in the past, no longer on steroids.  She is feeling well today and has no acute concerns or complaints.  She saw me about a month ago for temporomandibular joint dysfunction and was treated with 10 days of meloxicam with significant improvement.  Since I last saw her she had her COVID booster in December.   Past Medical/Surgical History: Past Medical History:  Diagnosis Date   Dizziness 08/12/2016   Giant cell arteritis ([place]) 08/13/2016   High cholesterol    Hypertension    Pneumonia    Second degree Mobitz II AV block    Tremor 02/24/2014   Vertigo 08/12/2016    Past Surgical History:  Procedure Laterality Date   BLADDER SURGERY     catract Left 04/06/2014   PACEMAKER IMPLANT N/A 06/26/2019   Procedure: PACEMAKER IMPLANT;  Surgeon: Thompson Grayer, MD;  Location: [facility];  Service: Cardiovascular;  Laterality: N/A;   VAGINAL HYSTERECTOMY      Social History:  reports that she has never smoked. She has never used smokeless tobacco. She reports that she does not drink alcohol and does not use drugs.  Allergies: No Known Allergies  Family History:  Family History  Problem Relation Age of Onset   Heart attack Mother    Diabetes Mother    Stroke Father    High blood  pressure Father      Current Outpatient Medications:    acetaminophen (TYLENOL) 325 MG tablet, Take 650 mg by mouth every 6 (six) hours as needed for mild pain., Disp: , Rfl:    allopurinol (ZYLOPRIM) 100 MG tablet, TAKE 1 TABLET(100 MG) BY MOUTH DAILY, Disp: 90 tablet, Rfl: 1   amLODipine (NORVASC) 5 MG tablet, TAKE 1 TABLET(5 MG) BY MOUTH DAILY, Disp: 90 tablet, Rfl: 1   apixaban (ELIQUIS) 5 MG TABS tablet, TAKE 1 TABLET(5 MG) BY MOUTH TWICE DAILY, Disp: 180 tablet, Rfl: 1   atorvastatin (LIPITOR) 20 MG tablet, Take 1 tablet (20 mg total) by mouth daily., Disp: 90 tablet, Rfl: 3   dorzolamide (TRUSOPT) 2 % ophthalmic solution, Place 1 drop into the left eye 2 (two) times daily. , Disp: , Rfl:    furosemide (LASIX) 20 MG tablet, TAKE 1/2 TABLET(10 MG) BY MOUTH DAILY, Disp: 45 tablet, Rfl: 2   meclizine (ANTIVERT) 12.5 MG tablet, Take 12.5 mg by mouth 3 (three) times daily as needed for dizziness., Disp: , Rfl:    omeprazole (PRILOSEC) 20 MG capsule, Take 20 mg by mouth every other day., Disp: , Rfl:    potassium chloride (KLOR-CON) 10 MEQ tablet, TAKE 1 TABLET(10 MEQ) BY MOUTH DAILY, Disp: 90 tablet, Rfl: 0   triamcinolone (KENALOG) 0.1 % paste, as needed., Disp: , Rfl:   Review of Systems:  Constitutional: Denies fever, chills, diaphoresis, appetite change and fatigue.  HEENT: Denies photophobia, eye pain, redness, hearing  loss, ear pain, congestion, sore throat, rhinorrhea, sneezing, mouth sores, trouble swallowing, neck pain, neck stiffness and tinnitus.   Respiratory: Denies SOB, DOE, cough, chest tightness,  and wheezing.   Cardiovascular: Denies chest pain, palpitations and leg swelling.  Gastrointestinal: Denies nausea, vomiting, abdominal pain, diarrhea, constipation, blood in stool and abdominal distention.  Genitourinary: Denies dysuria, urgency, frequency, hematuria, flank pain and difficulty urinating.  Endocrine: Denies: hot or cold intolerance, sweats, changes in hair or nails,  polyuria, polydipsia. Musculoskeletal: Denies myalgias, back pain, joint swelling, arthralgias and gait problem.  Skin: Denies pallor, rash and wound.  Neurological: Denies dizziness, seizures, syncope, weakness, light-headedness, numbness and headaches.  Hematological: Denies adenopathy. Easy bruising, personal or family bleeding history  Psychiatric/Behavioral: Denies suicidal ideation, mood changes, confusion, nervousness, sleep disturbance and agitation    Physical Exam: Vitals:   09/04/21 0923  BP: 124/84  Pulse: 69  Temp: 98.9 F (37.2 C)  TempSrc: Oral  SpO2: 94%  Weight: 168 lb 6.4 oz (76.4 kg)    Body mass index is 28.91 kg/m.   Constitutional: NAD, calm, comfortable Eyes: PERRL, lids and conjunctivae normal ENMT: Mucous membranes are moist.  Respiratory: clear to auscultation bilaterally, no wheezing, no crackles. Normal respiratory effort. No accessory muscle use.  Cardiovascular: Regular rate and rhythm, no murmurs / rubs / gallops. No extremity edema.  Neurologic: Grossly intact and nonfocal Psychiatric: Normal judgment and insight. Alert and oriented x 3. Normal mood.    Impression and Plan:  Stage 3b chronic kidney disease ([place]) -Last creatinine was 1.330 in August 2022 for a GFR of 36.9. -Recheck renal function today.  Anemia, chronic disease -Due to chronic kidney disease  Vitamin D deficiency -Recheck vitamin D levels when she returns for CPE  Primary hypertension  - Plan: Comprehensive metabolic panel  Mixed hyperlipidemia  - Plan: Lipid panel  Time spent: 30 minutes reviewing chart, interviewing and examining patient and formulating plan of care.   Patient Instructions  -Nice seeing you today!!  -Lab work today; will notify you once results are available.  -Schedule follow up in 6 months or sooner as needed.     Lelon Frohlich, MD [facility]

## 2021-09-05 ENCOUNTER — Other Ambulatory Visit: Payer: Self-pay | Admitting: Internal Medicine

## 2021-09-05 DIAGNOSIS — N1832 Chronic kidney disease, stage 3b: Secondary | ICD-10-CM

## 2021-09-18 ENCOUNTER — Other Ambulatory Visit: Payer: Self-pay | Admitting: Internal Medicine

## 2021-09-18 DIAGNOSIS — Z1231 Encounter for screening mammogram for malignant neoplasm of breast: Secondary | ICD-10-CM

## 2021-09-19 ENCOUNTER — Other Ambulatory Visit (INDEPENDENT_AMBULATORY_CARE_PROVIDER_SITE_OTHER): Payer: Medicare Other

## 2021-09-19 DIAGNOSIS — N1832 Chronic kidney disease, stage 3b: Secondary | ICD-10-CM | POA: Diagnosis not present

## 2021-09-19 LAB — COMPREHENSIVE METABOLIC PANEL
ALT: 13 U/L (ref 0–35)
AST: 23 U/L (ref 0–37)
Albumin: 4 g/dL (ref 3.5–5.2)
Alkaline Phosphatase: 56 U/L (ref 39–117)
BUN: 27 mg/dL — ABNORMAL HIGH (ref 6–23)
CO2: 21 mEq/L (ref 19–32)
Calcium: 9.7 mg/dL (ref 8.4–10.5)
Chloride: 103 mEq/L (ref 96–112)
Creatinine, Ser: 1.51 mg/dL — ABNORMAL HIGH (ref 0.40–1.20)
GFR: 31.6 mL/min — ABNORMAL LOW (ref >60.00)
Glucose, Bld: 89 mg/dL (ref 70–99)
Potassium: 4.3 mEq/L (ref 3.5–5.1)
Sodium: 132 mEq/L — ABNORMAL LOW (ref 135–145)
Total Bilirubin: 0.9 mg/dL (ref 0.2–1.2)
Total Protein: 8.2 g/dL (ref 6.0–8.3)

## 2021-09-20 ENCOUNTER — Other Ambulatory Visit: Payer: Self-pay | Admitting: Internal Medicine

## 2021-09-20 DIAGNOSIS — N1832 Chronic kidney disease, stage 3b: Secondary | ICD-10-CM

## 2021-09-22 ENCOUNTER — Ambulatory Visit (INDEPENDENT_AMBULATORY_CARE_PROVIDER_SITE_OTHER): Payer: Medicare Other

## 2021-09-22 DIAGNOSIS — I441 Atrioventricular block, second degree: Secondary | ICD-10-CM | POA: Diagnosis not present

## 2021-09-22 LAB — CUP PACEART REMOTE DEVICE CHECK
Battery Remaining Longevity: 137 mo
Battery Voltage: 3.01 V
Brady Statistic AP VP Percent: 6.55 %
Brady Statistic AP VS Percent: 0.07 %
Brady Statistic AS VP Percent: 38.14 %
Brady Statistic AS VS Percent: 55.25 %
Brady Statistic RA Percent Paced: 9.22 %
Brady Statistic RV Percent Paced: 44.04 %
Date Time Interrogation Session: 20230310043641
Implantable Lead Implant Date: 20201211
Implantable Lead Implant Date: 20201211
Implantable Lead Location: 753859
Implantable Lead Location: 753860
Implantable Lead Model: 5076
Implantable Lead Model: 5076
Implantable Pulse Generator Implant Date: 20201211
Lead Channel Impedance Value: 323 Ohm
Lead Channel Impedance Value: 399 Ohm
Lead Channel Impedance Value: 437 Ohm
Lead Channel Impedance Value: 437 Ohm
Lead Channel Pacing Threshold Amplitude: 0.75 V
Lead Channel Pacing Threshold Pulse Width: 0.4 ms
Lead Channel Sensing Intrinsic Amplitude: 3.625 mV
Lead Channel Sensing Intrinsic Amplitude: 3.625 mV
Lead Channel Sensing Intrinsic Amplitude: 4.75 mV
Lead Channel Sensing Intrinsic Amplitude: 4.75 mV
Lead Channel Setting Pacing Amplitude: 2 V
Lead Channel Setting Pacing Amplitude: 2.5 V
Lead Channel Setting Pacing Pulse Width: 0.4 ms
Lead Channel Setting Sensing Sensitivity: 1.2 mV

## 2021-09-27 NOTE — Progress Notes (Signed)
Remote pacemaker transmission.   

## 2021-10-03 NOTE — Progress Notes (Deleted)
? ? ?Office Visit  ?  ?Patient Name: Lori Wilson ?Date of Encounter: 10/03/2021 ? ?Primary Care Provider:  Isaac Bliss, Rayford Halsted, MD ?Primary Cardiologist:  Jenkins Rouge, MD ?Primary Electrophysiologist: Thompson Grayer, MD ?Chief Complaint  ?  ?Joanette Silveria is a 87 y.o. female presents today for *** ? ?History of Present Illness  ?  ?Jameisha Stofko is a 87 y.o. female with PMH of HTN, HLD, giant cell arteritis, PAF(on Eliquis). Echo done 08/12/16 for dizziness was normal with EF 60-65% just mild AR and aortic root 4.2 cm.repeat echo completed 7/20 preventative monitor with third-degree AV block in the 30's and PAF started on Eliquis, PPM implant 12/20 after patient declined in May of that year. ? ?She was last seen 6/22 with Dr. Rayann Heman, at that time she reported doing well but did have complaints of balance issues. ? ?Since{Blank single:19197::"last being seen in our clinic","discharge from hospital"} the patient reports doing ***.  '@He'$ /She@ denies chest pain, palpitations, dyspnea, PND, orthopnea, nausea, vomiting, dizziness, syncope, edema, weight gain, or early satiety. ? ? ?Past Medical History  ?  ?Past Medical History:  ?Diagnosis Date  ? Dizziness 08/12/2016  ? Giant cell arteritis ([place]) 08/13/2016  ? High cholesterol   ? Hypertension   ? Pneumonia   ? Second degree Mobitz II AV block   ? Tremor 02/24/2014  ? Vertigo 08/12/2016  ? ?Past Surgical History:  ?Procedure Laterality Date  ? BLADDER SURGERY    ? catract Left 04/06/2014  ? PACEMAKER IMPLANT N/A 06/26/2019  ? Procedure: PACEMAKER IMPLANT;  Surgeon: Thompson Grayer, MD;  Location: [place];  Service: Cardiovascular;  Laterality: N/A;  ? VAGINAL HYSTERECTOMY    ? ? ?Allergies ? ?No Known Allergies ? ?Home Medications  ?  ?Current Outpatient Medications  ?Medication Sig Dispense Refill  ? acetaminophen (TYLENOL) 325 MG tablet Take 650 mg by mouth every 6 (six) hours as needed for mild pain.    ? allopurinol (ZYLOPRIM) 100 MG tablet TAKE 1  TABLET(100 MG) BY MOUTH DAILY 90 tablet 1  ? amLODipine (NORVASC) 5 MG tablet TAKE 1 TABLET(5 MG) BY MOUTH DAILY 90 tablet 1  ? apixaban (ELIQUIS) 5 MG TABS tablet TAKE 1 TABLET(5 MG) BY MOUTH TWICE DAILY 180 tablet 1  ? atorvastatin (LIPITOR) 20 MG tablet Take 1 tablet (20 mg total) by mouth daily. 90 tablet 3  ? dorzolamide (TRUSOPT) 2 % ophthalmic solution Place 1 drop into the left eye 2 (two) times daily.     ? furosemide (LASIX) 20 MG tablet TAKE 1/2 TABLET(10 MG) BY MOUTH DAILY 45 tablet 2  ? meclizine (ANTIVERT) 12.5 MG tablet Take 12.5 mg by mouth 3 (three) times daily as needed for dizziness.    ? omeprazole (PRILOSEC) 20 MG capsule Take 20 mg by mouth every other day.    ? potassium chloride (KLOR-CON) 10 MEQ tablet TAKE 1 TABLET(10 MEQ) BY MOUTH DAILY 90 tablet 0  ? triamcinolone (KENALOG) 0.1 % paste as needed.    ? ?No current facility-administered medications for this visit.  ?  ? ?Review of Systems  ?Please see the history of present illness.    ?(+)*** ?(+)*** ? ?All other systems reviewed and are otherwise negative except as noted above. ? ?Physical Exam  ?  ?Wt Readings from Last 3 Encounters:  ?09/04/21 168 lb 6.4 oz (76.4 kg)  ?08/15/21 169 lb (76.7 kg)  ?03/02/21 170 lb 1.6 oz (77.2 kg)  ? ?TD:DUKGU were no vitals filed  for this visit.,There is no height or weight on file to calculate BMI. ? ?GEN: Well nourished, well developed, in no acute distress. ?Neck: Supple, no JVD, carotid bruits, or masses. ?Cardiac: S1,S2,***RRR, no murmurs, rubs, or gallops. No clubbing, cyanosis, edema.  ?Radials/PT 2+ and equal bilaterally.  ?Respiratory:  ***Respirations regular and unlabored, clear to auscultation bilaterally. ?MS: no deformity or atrophy. ?Skin: warm and dry, no rash. ?Neuro:  Strength and sensation are intact. ?Psych: Normal affect. ? ?EKG/LABS/Other Studies Reviewed  ?  ?ECG personally reviewed by me today - ***  with rate of ***- no acute changes. ? ?Risk Assessment/Calculations:   ?{Does  this patient have ATRIAL FIBRILLATION?:[phone]} ? ?    ? ? ?Lab Results  ?Component Value Date  ? WBC 5.1 03/02/2021  ? HGB 11.2 (L) 03/02/2021  ? HCT 35.0 (L) 03/02/2021  ? MCV 91.6 03/02/2021  ? PLT 247.0 03/02/2021  ? ?Lab Results  ?Component Value Date  ? CREATININE 1.51 (H) 09/19/2021  ? BUN 27 (H) 09/19/2021  ? NA 132 (L) 09/19/2021  ? K 4.3 09/19/2021  ? CL 103 09/19/2021  ? CO2 21 09/19/2021  ? ?Lab Results  ?Component Value Date  ? ALT 13 09/19/2021  ? AST 23 09/19/2021  ? ALKPHOS 56 09/19/2021  ? BILITOT 0.9 09/19/2021  ? ?Lab Results  ?Component Value Date  ? CHOL 193 09/04/2021  ? HDL 53.30 09/04/2021  ? LDLCALC 111 (H) 09/04/2021  ? LDLDIRECT 131.0 06/13/2021  ? TRIG 142.0 09/04/2021  ? CHOLHDL 4 09/04/2021  ?  ?Lab Results  ?Component Value Date  ? HGBA1C 6.0 (H) 08/13/2016  ? ? ?Assessment & Plan  ?  ?1.  Paroxysmal A-fib: ?-Continue Eliquis for CHA2DS2-VASc of 4 ?- ? ?2.  Second-degree AV block: ?-s/p [employer] PPM implanted 12/20 ?- ? ?3.  Hypertension: ?-Stable with no anginal symptoms. No indication for ischemic evaluation.  ?- BP well controlled. Continue current antihypertensive regimen.   ? ?4.  Hyperlipidemia: ?-Last LDL 2/23 was 111, goal is less than 70 ?-Continue Lipitor 20 mg ? ? ? ?   ?Disposition: Follow-up with Jenkins Rouge, MD or APP in *** months ?{Are you ordering a CV Procedure (e.g. stress test, cath, DCCV, TEE, etc)?   Press F2        :[id number]}  ? ?Medication Adjustments/Labs and Tests Ordered: ?Current medicines are reviewed at length with the patient today.  Concerns regarding medicines are outlined above.  ?Tests Ordered: ?No orders of the defined types were placed in this encounter. ? ?Medication Changes: ?No orders of the defined types were placed in this encounter. ? ? ?Signed, ?Mable Fill, Marissa Nestle, NP ?10/03/2021, 11:49 AM ?[place] ? ?Notice: This dictation was prepared with Dragon dictation along with smaller phrase technology. Any  transcriptional errors that result from this process are unintentional and may not be corrected upon review. ?  ?I spent***minutes examining this patient, reviewing medications, and using patient centered shared decision making involving her cardiac care.  Prior to her visit I spent greater than 20 minutes reviewing her past medical history,  medications, and prior cardiac tests. ?

## 2021-10-03 NOTE — Progress Notes (Signed)
? ? ?Office Visit  ?  ?Patient Name: Lori Wilson ?Date of Encounter: 10/04/2021 ? ?Primary Care Provider:  Isaac Bliss, Rayford Halsted, MD ?Primary Cardiologist:  Jenkins Rouge, MD ?Primary Electrophysiologist: Thompson Grayer, MD ?Chief Complaint  ?  ?Lori Wilson is a 87 y.o. female presents today for 32-monthfollow-up ? ? ?Patient Profile: ?Second-degree Mobitz 2 AV block ?Permanent pacemaker implant 12/20 ?Hypertension ?Hyperlipidemia ?Paroxysmal atrial fibrillation (CHADVASC 4 on eliquis)  ? ? ?Prior CV Studies: ?1/18 echo completed LVEF 60-65%, normal wall motion, mild aortic regurgitation  ?1/18 carotid Dopplers completed due to dizziness with findings of 39% internal stenosis bilaterally ?5/20 event monitor with SSS/PAF and 2:1 AV block ?6/20 patient refused PPM at this time ?12/20 Medtronic PPM ? ?History of Present Illness  ?  ?Lori Sengis a 87y.o. female with the above problem list was last seen 6/22 by AOda Kilts PA.  During this visit she reported doing well from a cardiac perspective.  She was having some balance issues and was considering a wheelchair versus a cane. Since last being seen in our clinic Ms. MTouchreports she is doing well. She is no longer having dizziness and does not require wheelchair or cane.  She is taking Antivert for vertigo and it is well controlled today. She denies chest pain, palpitations, dyspnea, PND, orthopnea, nausea, vomiting, dizziness, syncope, edema, weight gain, or early satiety.  Her son checks her blood pressures at home and they are running in the 100s over 60s to 70s.  Today her blood pressure is well controlled at 100/70 with heart rate 63. She is compliant with her Eliquis and reports no blood in her urine or stools. ? ? ?Past Medical History  ?  ?Past Medical History:  ?Diagnosis Date  ? Dizziness 08/12/2016  ? Giant cell arteritis ([place] 08/13/2016  ? High cholesterol   ? Hypertension   ? Pneumonia   ? Second degree Mobitz II AV block   ?  Tremor 02/24/2014  ? Vertigo 08/12/2016  ? ?Past Surgical History:  ?Procedure Laterality Date  ? BLADDER SURGERY    ? catract Left 04/06/2014  ? PACEMAKER IMPLANT N/A 06/26/2019  ? Procedure: PACEMAKER IMPLANT;  Surgeon: AThompson Grayer MD;  [facility];  Service: Cardiovascular;  Laterality: N/A;  ? VAGINAL HYSTERECTOMY    ? ? ?Allergies ? ?No Known Allergies ? ?Home Medications  ?  ?Current Outpatient Medications  ?Medication Sig Dispense Refill  ? acetaminophen (TYLENOL) 325 MG tablet Take 650 mg by mouth every 6 (six) hours as needed for mild pain.    ? allopurinol (ZYLOPRIM) 100 MG tablet TAKE 1 TABLET(100 MG) BY MOUTH DAILY 90 tablet 1  ? amLODipine (NORVASC) 5 MG tablet TAKE 1 TABLET(5 MG) BY MOUTH DAILY 90 tablet 1  ? apixaban (ELIQUIS) 5 MG TABS tablet TAKE 1 TABLET(5 MG) BY MOUTH TWICE DAILY 180 tablet 1  ? atorvastatin (LIPITOR) 20 MG tablet Take 1 tablet (20 mg total) by mouth daily. 90 tablet 3  ? dorzolamide (TRUSOPT) 2 % ophthalmic solution Place 1 drop into the left eye 2 (two) times daily.     ? furosemide (LASIX) 20 MG tablet TAKE 1/2 TABLET(10 MG) BY MOUTH DAILY 45 tablet 2  ? meclizine (ANTIVERT) 12.5 MG tablet Take 12.5 mg by mouth 3 (three) times daily as needed for dizziness.    ? omeprazole (PRILOSEC) 20 MG capsule Take 20 mg by mouth every other day.    ? potassium chloride (  KLOR-CON) 10 MEQ tablet TAKE 1 TABLET(10 MEQ) BY MOUTH DAILY 90 tablet 0  ? triamcinolone (KENALOG) 0.1 % paste as needed.    ? ?No current facility-administered medications for this visit.  ?  ? ?Review of Systems  ?Please see the history of present illness.    ? ?All other systems reviewed and are otherwise negative except as noted above. ? ?Physical Exam  ?  ?Wt Readings from Last 3 Encounters:  ?10/04/21 163 lb 9.6 oz (74.2 kg)  ?09/04/21 168 lb 6.4 oz (76.4 kg)  ?08/15/21 169 lb (76.7 kg)  ? ?VS: ?Vitals:  ? 10/04/21 0912  ?BP: 100/70  ?Pulse: 63  ?SpO2: 93%  ?,Body mass index is 26.41 kg/m?. ? ?GEN:  Well nourished, well developed, in no acute distress. ?Neck: Supple, no JVD, carotid bruits, or masses. ?Cardiac: S1,S2, RRR, no murmurs, rubs, or gallops. No clubbing, cyanosis, no edema.  ?Radials/PT 2+ and equal bilaterally.  ?Respiratory:  Respirations regular and unlabored, clear to auscultation bilaterally. ?MS: no deformity or atrophy. ?Skin: warm and dry, no rash. ?Neuro:  Strength and sensation are intact. ?Psych: Normal affect. ? ?EKG/LABS/Other Studies Reviewed  ?  ?ECG personally reviewed by me today -sinus rhythm with second-degree type I and ventricular pacing with rate of 63- no acute changes. ? ?Risk Assessment/Calculations:   ? ?CHA2DS2-VASc Score = 4  ? This indicates a 4.8% annual risk of stroke. ?The patient's score is based upon: ?CHF History: 0 ?HTN History: 1 ?Diabetes History: 0 ?Stroke History: 0 ?Vascular Disease History: 0 ?Age Score: 2 ?Gender Score: 1 ?  ? ? ?Lab Results  ?Component Value Date  ? WBC 5.1 03/02/2021  ? HGB 11.2 (L) 03/02/2021  ? HCT 35.0 (L) 03/02/2021  ? MCV 91.6 03/02/2021  ? PLT 247.0 03/02/2021  ? ?Lab Results  ?Component Value Date  ? CREATININE 1.51 (H) 09/19/2021  ? BUN 27 (H) 09/19/2021  ? NA 132 (L) 09/19/2021  ? K 4.3 09/19/2021  ? CL 103 09/19/2021  ? CO2 21 09/19/2021  ? ?Lab Results  ?Component Value Date  ? ALT 13 09/19/2021  ? AST 23 09/19/2021  ? ALKPHOS 56 09/19/2021  ? BILITOT 0.9 09/19/2021  ? ?Lab Results  ?Component Value Date  ? CHOL 193 09/04/2021  ? HDL 53.30 09/04/2021  ? LDLCALC 111 (H) 09/04/2021  ? LDLDIRECT 131.0 06/13/2021  ? TRIG 142.0 09/04/2021  ? CHOLHDL 4 09/04/2021  ?  ?Lab Results  ?Component Value Date  ? HGBA1C 6.0 (H) 08/13/2016  ? ? ?Assessment & Plan  ?  ?1.  Paroxysmal atrial fibrillation: ?-Patient currently on Eliquis 5 mg twice daily,  ?-Patient 76.4 kg,age 89, last creatinine 1.62. could reduced dose to 2.5 mg based on criteria.   ?-We will recheck BMET today and if above 1.5 we will reduce her dosage to 2.5 mg twice  daily ?-CHA2DS2-VASc Score = 4 [CHF History: 0, HTN History: 1, Diabetes History: 0, Stroke History: 0, Vascular Disease History: 0, Age Score: 2, Gender Score: 1].  Therefore, the patient's annual risk of stroke is 4.8 %.     ? ?2.  Second-degree AV block: ?-s/p Medtronic PPM 6/20 implanted by Dr. Rayann Heman ?-She will be seeing Dr. Quentin Ore 6/21 ? ? ?3.  Hypertension: ?-Blood pressure today was well controlled at 100/70. ?-Continue Norvasc 5 mg ?-Continue Lasix 20 mg twice daily ?-I Encouraged her to increase her water intake with Lasix. ? ?4.  Hyperlipidemia: ?-Last LDL 111, continue current statin therapy ?-Heart healthy low-sodium  diet ? ?Disposition: Follow-up with Jenkins Rouge, MD in 1 year ?Medication Adjustments/Labs and Tests Ordered: ?Current medicines are reviewed at length with the patient today.  Concerns regarding medicines are outlined above.  ?Tests Ordered: ?Orders Placed This Encounter  ?Procedures  ? CBC  ? Basic metabolic panel  ? EKG 12-Lead  ? ?Medication Changes: ?No orders of the defined types were placed in this encounter. ? ? ?Signed, ?Mable Fill, Marissa Nestle, NP ?10/04/2021, 9:55 AM ?[place] ? ?Notice: This dictation was prepared with Dragon dictation along with smaller phrase technology. Any transcriptional errors that result from this process are unintentional and may not be corrected upon review. ?  ?I spent 36 minutes examining this patient, reviewing medications, and using patient centered shared decision making involving her cardiac care.  Prior to her visit I spent greater than 20 minutes reviewing her past medical history,  medications, and prior cardiac tests. ?

## 2021-10-04 ENCOUNTER — Ambulatory Visit (INDEPENDENT_AMBULATORY_CARE_PROVIDER_SITE_OTHER): Payer: Medicare Other | Admitting: Nurse Practitioner

## 2021-10-04 ENCOUNTER — Encounter: Payer: Self-pay | Admitting: Nurse Practitioner

## 2021-10-04 ENCOUNTER — Other Ambulatory Visit: Payer: Self-pay

## 2021-10-04 VITALS — BP 100/70 | HR 63 | Ht 66.0 in | Wt 163.6 lb

## 2021-10-04 DIAGNOSIS — I10 Essential (primary) hypertension: Secondary | ICD-10-CM | POA: Diagnosis not present

## 2021-10-04 DIAGNOSIS — I480 Paroxysmal atrial fibrillation: Secondary | ICD-10-CM

## 2021-10-04 DIAGNOSIS — E782 Mixed hyperlipidemia: Secondary | ICD-10-CM

## 2021-10-04 DIAGNOSIS — I441 Atrioventricular block, second degree: Secondary | ICD-10-CM | POA: Diagnosis not present

## 2021-10-04 DIAGNOSIS — Z950 Presence of cardiac pacemaker: Secondary | ICD-10-CM | POA: Diagnosis not present

## 2021-10-04 LAB — BASIC METABOLIC PANEL
BUN/Creatinine Ratio: 20 (ref 12–28)
BUN: 30 mg/dL — ABNORMAL HIGH (ref 8–27)
CO2: 19 mmol/L — ABNORMAL LOW (ref 20–29)
Calcium: 9.4 mg/dL (ref 8.7–10.3)
Chloride: 106 mmol/L (ref 96–106)
Creatinine, Ser: 1.5 mg/dL — ABNORMAL HIGH (ref 0.57–1.00)
Glucose: 89 mg/dL (ref 70–99)
Potassium: 4.1 mmol/L (ref 3.5–5.2)
Sodium: 139 mmol/L (ref 134–144)
eGFR: 34 mL/min/{1.73_m2} — ABNORMAL LOW (ref >59)

## 2021-10-04 LAB — CBC
Hematocrit: 33.5 % — ABNORMAL LOW (ref 34.0–46.6)
Hemoglobin: 10.9 g/dL — ABNORMAL LOW (ref 11.1–15.9)
MCH: 29.4 pg (ref 26.6–33.0)
MCHC: 32.5 g/dL (ref 31.5–35.7)
MCV: 90 fL (ref 79–97)
Platelets: 246 10*3/uL (ref 150–450)
RBC: 3.71 x10E6/uL — ABNORMAL LOW (ref 3.77–5.28)
RDW: 13.3 % (ref 11.7–15.4)
WBC: 5.7 10*3/uL (ref 3.4–10.8)

## 2021-10-04 NOTE — Patient Instructions (Addendum)
Medication Instructions:  ?Your physician recommends that you continue on your current medications as directed. Please refer to the Current Medication list given to you today. ? ?*If you need a refill on your cardiac medications before your next appointment, please call your pharmacy* ? ? ?Lab Work: ?TODAY:  CBC ? ?If you have labs (blood work) drawn today and your tests are completely normal, you will receive your results only by: ?MyChart Message (if you have MyChart) OR ?A paper copy in the mail ?If you have any lab test that is abnormal or we need to change your treatment, we will call you to review the results. ? ? ?Testing/Procedures: ?None ordered ? ? ?Follow-Up: ?At [facility], you and your health needs are our priority.  As part of our continuing mission to provide you with exceptional heart care, we have created designated Provider Care Teams.  These Care Teams include your primary Cardiologist (physician) and Advanced Practice Providers (APPs -  Physician Assistants and Nurse Practitioners) who all work together to provide you with the care you need, when you need it. ? ?We recommend signing up for the patient portal called "MyChart".  Sign up information is provided on this After Visit Summary.  MyChart is used to connect with patients for Virtual Visits (Telemedicine).  Patients are able to view lab/test results, encounter notes, upcoming appointments, etc.  Non-urgent messages can be sent to your provider as well.   ?To learn more about what you can do with MyChart, go to [email or web address].   ? ?Your next appointment:   ?12 month(s) ? ?The format for your next appointment:   ?In Person ? ?Provider:   ?Jenkins Rouge, MD  ? ? ?Other Instructions ? ?

## 2021-10-05 ENCOUNTER — Telehealth: Payer: Self-pay

## 2021-10-05 MED ORDER — APIXABAN 2.5 MG PO TABS: 2.5000 mg | ORAL_TABLET | Freq: Two times a day (BID) | ORAL | 3 refills | Status: DC

## 2021-10-05 NOTE — Telephone Encounter (Signed)
Spoke with patient and discussed lab results. ? ?Eliquis decreased to 2.'5mg'$  twice daily, per Ambrose Pancoast, NP's recommendation. Sent to pharmacy of choice. ? ?Patient verbalized understanding. ?

## 2021-10-05 NOTE — Telephone Encounter (Signed)
-----   Message from Tempie Donning, NP sent at 10/05/2021  8:51 AM EDT ----- ?Good morning, ? ?Please let Lori Wilson know that we are reducing her Eliquis from 5 mg twice a day to 2.5 mg twice a day based on her renal function and age.  She also is a little anemic but this is stable with her stage III chronic kidney disease.  She should continue recommendations by PCP. ? ?Thank you, ? ?Ambrose Pancoast, NP ?

## 2021-10-25 ENCOUNTER — Ambulatory Visit
Admission: RE | Admit: 2021-10-25 | Discharge: 2021-10-25 | Disposition: A | Discharge status: Home / Self Care | Payer: Medicare Other | Source: Ambulatory Visit | Attending: Internal Medicine | Admitting: Internal Medicine

## 2021-10-25 DIAGNOSIS — Z1231 Encounter for screening mammogram for malignant neoplasm of breast: Secondary | ICD-10-CM | POA: Diagnosis not present

## 2021-11-01 ENCOUNTER — Other Ambulatory Visit: Payer: Self-pay | Admitting: Internal Medicine

## 2021-12-22 ENCOUNTER — Ambulatory Visit (INDEPENDENT_AMBULATORY_CARE_PROVIDER_SITE_OTHER): Payer: Medicare Other

## 2021-12-22 DIAGNOSIS — I441 Atrioventricular block, second degree: Secondary | ICD-10-CM

## 2021-12-22 LAB — CUP PACEART REMOTE DEVICE CHECK
Battery Remaining Longevity: 133 mo
Battery Voltage: 3.01 V
Brady Statistic AP VP Percent: 8.23 %
Brady Statistic AP VS Percent: 0.05 %
Brady Statistic AS VP Percent: 40.1 %
Brady Statistic AS VS Percent: 51.62 %
Brady Statistic RA Percent Paced: 11.66 %
Brady Statistic RV Percent Paced: 48.12 %
Date Time Interrogation Session: 20230608221118
Implantable Lead Implant Date: 20201211
Implantable Lead Implant Date: 20201211
Implantable Lead Location: 753859
Implantable Lead Location: 753860
Implantable Lead Model: 5076
Implantable Lead Model: 5076
Implantable Pulse Generator Implant Date: 20201211
Lead Channel Impedance Value: 304 Ohm
Lead Channel Impedance Value: 342 Ohm
Lead Channel Impedance Value: 418 Ohm
Lead Channel Impedance Value: 456 Ohm
Lead Channel Pacing Threshold Amplitude: 0.75 V
Lead Channel Pacing Threshold Pulse Width: 0.4 ms
Lead Channel Sensing Intrinsic Amplitude: 3.5 mV
Lead Channel Sensing Intrinsic Amplitude: 3.5 mV
Lead Channel Sensing Intrinsic Amplitude: 5 mV
Lead Channel Sensing Intrinsic Amplitude: 5 mV
Lead Channel Setting Pacing Amplitude: 2 V
Lead Channel Setting Pacing Amplitude: 2.5 V
Lead Channel Setting Pacing Pulse Width: 0.4 ms
Lead Channel Setting Sensing Sensitivity: 1.2 mV

## 2021-12-24 ENCOUNTER — Other Ambulatory Visit: Payer: Self-pay | Admitting: Internal Medicine

## 2021-12-24 DIAGNOSIS — I10 Essential (primary) hypertension: Secondary | ICD-10-CM

## 2021-12-28 NOTE — Progress Notes (Signed)
Remote pacemaker transmission.   

## 2022-01-03 ENCOUNTER — Ambulatory Visit (INDEPENDENT_AMBULATORY_CARE_PROVIDER_SITE_OTHER): Payer: Medicare Other | Admitting: Cardiology

## 2022-01-03 ENCOUNTER — Encounter: Payer: Self-pay | Admitting: Cardiology

## 2022-01-03 VITALS — BP 100/60 | HR 56 | Ht 66.0 in | Wt 165.0 lb

## 2022-01-03 DIAGNOSIS — I441 Atrioventricular block, second degree: Secondary | ICD-10-CM | POA: Diagnosis not present

## 2022-01-03 DIAGNOSIS — I480 Paroxysmal atrial fibrillation: Secondary | ICD-10-CM | POA: Diagnosis not present

## 2022-01-03 DIAGNOSIS — Z950 Presence of cardiac pacemaker: Secondary | ICD-10-CM | POA: Diagnosis not present

## 2022-01-03 LAB — CUP PACEART INCLINIC DEVICE CHECK
Battery Remaining Longevity: 133 mo
Battery Voltage: 3.01 V
Brady Statistic AP VP Percent: 8.23 %
Brady Statistic AP VS Percent: 0.11 %
Brady Statistic AS VP Percent: 39 %
Brady Statistic AS VS Percent: 52.66 %
Brady Statistic RA Percent Paced: 11.56 %
Brady Statistic RV Percent Paced: 46.57 %
Date Time Interrogation Session: 20230621134205
Implantable Lead Implant Date: 20201211
Implantable Lead Implant Date: 20201211
Implantable Lead Location: 753859
Implantable Lead Location: 753860
Implantable Lead Model: 5076
Implantable Lead Model: 5076
Implantable Pulse Generator Implant Date: 20201211
Lead Channel Impedance Value: 323 Ohm
Lead Channel Impedance Value: 361 Ohm
Lead Channel Impedance Value: 380 Ohm
Lead Channel Impedance Value: 437 Ohm
Lead Channel Pacing Threshold Amplitude: 0.75 V
Lead Channel Pacing Threshold Pulse Width: 0.4 ms
Lead Channel Sensing Intrinsic Amplitude: 1.125 mV
Lead Channel Sensing Intrinsic Amplitude: 4.375 mV
Lead Channel Sensing Intrinsic Amplitude: 5.5 mV
Lead Channel Sensing Intrinsic Amplitude: 5.875 mV
Lead Channel Setting Pacing Amplitude: 2 V
Lead Channel Setting Pacing Amplitude: 2.5 V
Lead Channel Setting Pacing Pulse Width: 0.4 ms
Lead Channel Setting Sensing Sensitivity: 1.2 mV

## 2022-01-03 NOTE — Patient Instructions (Signed)

## 2022-01-03 NOTE — Progress Notes (Signed)
Electrophysiology Office Note:    Date:  01/03/2022   ID:  Lori Wilson, DOB Dec 29, 1934, MRN [id number]  PCP:  Isaac Bliss, Rayford Halsted, MD  [facility] Cardiologist:  Jenkins Rouge, MD  [facility] Electrophysiologist:  Thompson Grayer, MD   Referring MD: Isaac Bliss, [place]*   Chief Complaint: Follow-up for PPM MDT  History of Present Illness:    Lori Wilson is a 87 y.o. female who presents for an evaluation of their PPM. 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  They were previously followed by Dr. Rayann Heman, last seen by him on 09/28/2019 via telemedicine. She was doing well since her pacemaker implant aside from feeling hot in the evenings. Her last remote device interrogation at the time showed normal device function and Afib burden 3.4%.  They were most recently seen in cardiology by Ambrose Pancoast, NP on 10/04/2021 where she was doing well. At home blood pressures were consistent in the 100s/60-70s. Her vertigo was well controlled on Antivert. No bleeding issues on Eliquis. Her EKG showed sinus rhythm with second-degree type I and ventricular pacing with rate of 63- no acute changes. At that time she was 76.4 kg, age 29, creatinine 1.62. Her BMET was rechecked and creatinine was 1.5. On 10/05/21 her Eliquis was reduced to 2.5 mg BID. Also noted to be a little anemic, but was stable with her stage 3 CKD.  Today, she reports that her pacemaker sometimes feels uncomfortable, usually associated with certain movements. For the longest time she was unable to wear anything over her device pocket due to tenderness.  They deny any palpitations, chest pain, shortness of breath, or peripheral edema. No lightheadedness, headaches, syncope, orthopnea, or PND.     Past Medical History:  Diagnosis Date    Dizziness 08/12/2016   Giant cell arteritis ([place]) 08/13/2016   High cholesterol    Hypertension    Pneumonia    Second degree Mobitz II AV block    Tremor 02/24/2014   Vertigo 08/12/2016    Past Surgical History:  Procedure Laterality Date   BLADDER SURGERY     catract Left 04/06/2014   PACEMAKER IMPLANT N/A 06/26/2019   Procedure: PACEMAKER IMPLANT;  Surgeon: Thompson Grayer, MD;  Location: [place] [facility];  Service: Cardiovascular;  Laterality: N/A;   VAGINAL HYSTERECTOMY      Current Medications: Current Meds  Medication Sig   acetaminophen (TYLENOL) 325 MG tablet Take 650 mg by mouth every 6 (six) hours as needed for mild pain.   allopurinol (ZYLOPRIM) 100 MG tablet TAKE 1 TABLET(100 MG) BY MOUTH DAILY   amLODipine (NORVASC) 5 MG tablet TAKE 1 TABLET(5 MG) BY MOUTH DAILY   apixaban (ELIQUIS) 2.5 MG TABS tablet Take 1 tablet (2.5 mg total) by mouth 2 (two) times daily.   atorvastatin (LIPITOR) 20 MG tablet Take 1 tablet (20 mg total) by mouth daily.   dorzolamide (TRUSOPT) 2 % ophthalmic solution Place 1 drop into the left eye 2 (two) times daily.    furosemide (LASIX) 20 MG tablet TAKE 1/2 TABLET(10 MG) BY MOUTH DAILY   meclizine (ANTIVERT) 12.5 MG tablet Take 12.5 mg by mouth 3 (three) times daily as needed for dizziness.   omeprazole (PRILOSEC) 20 MG capsule Take 20 mg by mouth every other day.   potassium chloride (KLOR-CON) 10 MEQ tablet TAKE 1 TABLET(10 MEQ) BY  MOUTH DAILY   triamcinolone (KENALOG) 0.1 % paste as needed.     Allergies:   Patient has no known allergies.   Social History   Socioeconomic History   Marital status: Widowed    Spouse name: Not on file   Number of children: 4   Years of education: 12   Highest education level: Not on file  Occupational History    Employer: RETIRED    Comment: Retired  Tobacco Use   Smoking status: Never   Smokeless tobacco: Never  Vaping Use   Vaping Use: Never used  Substance and Sexual Activity   Alcohol use: No     Alcohol/week: 0.0 standard drinks of alcohol   Drug use: No   Sexual activity: Not on file  Other Topics Concern   Not on file  Social History Narrative   Patient is widowed and her grandson stay with her.    Retired.   Education high school.   Right handed.   Caffeine coffee one cup daily.   Social Determinants of Health   Financial Resource Strain: Not on file  Food Insecurity: Not on file  Transportation Needs: Not on file  Physical Activity: Not on file  Stress: Not on file  Social Connections: Not on file     Family History: The patient's family history includes Diabetes in her mother; Heart attack in her mother; High blood pressure in her father; Stroke in her father.  ROS:   Please see the history of present illness.    All other systems reviewed and are negative.  EKGs/Labs/Other Studies Reviewed:    The following studies were reviewed today:  01/03/2022  In clinic device interrogation personally reviewed: Battery longevity stable.  Lead parameters intact.  Programmed DDI.  Frequent PACs and PVCs.  06/26/2019  Pacemaker Implant: CONCLUSIONS:   1. Successful implantation of a Medtronic Azure XT MRI conditional dual-chamber pacemaker for symptomatic mobitz II second degree AV block  2. No early apparent complications.   01/23/2019  Echocardiogram: Sonographer Comments: Technically difficult study due to poor echo windows.  IMPRESSIONS    1. The left ventricle has normal systolic function with an ejection  fraction of 60-65%. The cavity size was normal. Left ventricular diastolic  Doppler parameters are consistent with impaired relaxation. Indeterminate  filling pressures.   2. The right ventricle has normal systolic function. The cavity was  normal.   3. The aortic valve is tricuspid. Mild thickening of the aortic valve.  Aortic valve regurgitation is mild by color flow Doppler. No stenosis of  the aortic valve.   4. There is mild dilatation of the  ascending aorta measuring 41 mm.   5. Definity used; normal LV systolic function; mild diastolic  dysfunction; mild AI; mildly dilated ascending aorta.    EKG:   EKG is personally reviewed.  01/03/2022:  EKG was not ordered.   Recent Labs: 09/19/2021: ALT 13 10/04/2021: BUN 30; Creatinine, Ser 1.50; Hemoglobin 10.9; Platelets 246; Potassium 4.1; Sodium 139   Recent Lipid Panel    Component Value Date/Time   CHOL 193 09/04/2021 0948   TRIG 142.0 09/04/2021 0948   HDL 53.30 09/04/2021 0948   CHOLHDL 4 09/04/2021 0948   VLDL 28.4 09/04/2021 0948   LDLCALC 111 (H) 09/04/2021 0948   LDLCALC 196 (H) 03/01/2020 0952   LDLDIRECT 131.0 06/13/2021 0942    Physical Exam:    VS:  BP 100/60 (BP Location: Left Arm, Patient Position: Sitting, Cuff Size: Normal)   Pulse Marland Kitchen)  56   Ht '5\' 6"'$  (1.676 m)   Wt 165 lb (74.8 kg)   SpO2 96%   BMI 26.63 kg/m     Wt Readings from Last 3 Encounters:  01/03/22 165 lb (74.8 kg)  10/04/21 163 lb 9.6 oz (74.2 kg)  09/04/21 168 lb 6.4 oz (76.4 kg)     GEN: Well nourished, well developed in no acute distress HEENT: Normal NECK: No JVD; No carotid bruits LYMPHATICS: No lymphadenopathy CARDIAC: RRR, no murmurs, rubs, gallops; Device pocket well healed. RESPIRATORY:  Clear to auscultation without rales, wheezing or rhonchi  ABDOMEN: Soft, non-tender, non-distended MUSCULOSKELETAL:  No edema; No deformity  SKIN: Warm and dry NEUROLOGIC:  Alert and oriented x 3 PSYCHIATRIC:  Normal affect       ASSESSMENT:    1. Second degree AV block   2. Pacemaker   3. PAF (paroxysmal atrial fibrillation) (HCC)    PLAN:    In order of problems listed above:  #Permanent pacemaker in situ #Second-degree AV block Pacemaker functioning appropriately.  Continue remote monitoring.  #Paroxysmal atrial fibrillation On Eliquis for stroke prophylaxis.  Continue remote monitoring of the device.  Very low burden of atrial arrhythmia.  Follow-up in 1  year.   Medication Adjustments/Labs and Tests Ordered: Current medicines are reviewed at length with the patient today.  Concerns regarding medicines are outlined above.   No orders of the defined types were placed in this encounter.  No orders of the defined types were placed in this encounter.  I,Mathew Stumpf,acting as a Education administrator for Vickie Epley, MD.,have documented all relevant documentation on the behalf of Vickie Epley, MD,as directed by  Vickie Epley, MD while in the presence of Vickie Epley, MD.  I, Vickie Epley, MD, have reviewed all documentation for this visit. The documentation on 01/03/22 for the exam, diagnosis, procedures, and orders are all accurate and complete.   Signed, Hilton Cork. Quentin Ore, MD, [facility], [facility] 01/03/2022 1:27 PM    Electrophysiology [facility]

## 2022-01-10 DIAGNOSIS — M549 Dorsalgia, unspecified: Secondary | ICD-10-CM | POA: Diagnosis not present

## 2022-01-10 DIAGNOSIS — M79643 Pain in unspecified hand: Secondary | ICD-10-CM | POA: Diagnosis not present

## 2022-01-10 DIAGNOSIS — M1009 Idiopathic gout, multiple sites: Secondary | ICD-10-CM | POA: Diagnosis not present

## 2022-01-10 DIAGNOSIS — N289 Disorder of kidney and ureter, unspecified: Secondary | ICD-10-CM | POA: Diagnosis not present

## 2022-01-10 DIAGNOSIS — M150 Primary generalized (osteo)arthritis: Secondary | ICD-10-CM | POA: Diagnosis not present

## 2022-01-10 DIAGNOSIS — M316 Other giant cell arteritis: Secondary | ICD-10-CM | POA: Diagnosis not present

## 2022-01-10 DIAGNOSIS — R768 Other specified abnormal immunological findings in serum: Secondary | ICD-10-CM | POA: Diagnosis not present

## 2022-02-06 ENCOUNTER — Other Ambulatory Visit: Payer: Self-pay | Admitting: Internal Medicine

## 2022-02-06 DIAGNOSIS — I10 Essential (primary) hypertension: Secondary | ICD-10-CM

## 2022-02-12 DIAGNOSIS — H402211 Chronic angle-closure glaucoma, right eye, mild stage: Secondary | ICD-10-CM | POA: Diagnosis not present

## 2022-02-12 DIAGNOSIS — H40052 Ocular hypertension, left eye: Secondary | ICD-10-CM | POA: Diagnosis not present

## 2022-02-12 DIAGNOSIS — H402223 Chronic angle-closure glaucoma, left eye, severe stage: Secondary | ICD-10-CM | POA: Diagnosis not present

## 2022-03-23 ENCOUNTER — Ambulatory Visit (INDEPENDENT_AMBULATORY_CARE_PROVIDER_SITE_OTHER): Payer: Medicare Other

## 2022-03-23 DIAGNOSIS — I441 Atrioventricular block, second degree: Secondary | ICD-10-CM | POA: Diagnosis not present

## 2022-03-27 LAB — CUP PACEART REMOTE DEVICE CHECK
Battery Remaining Longevity: 130 mo
Battery Voltage: 3.01 V
Brady Statistic AP VP Percent: 10.72 %
Brady Statistic AP VS Percent: 0.07 %
Brady Statistic AS VP Percent: 44.78 %
Brady Statistic AS VS Percent: 44.44 %
Brady Statistic RA Percent Paced: 15.38 %
Brady Statistic RV Percent Paced: 55.44 %
Date Time Interrogation Session: 20230907223719
Implantable Lead Implant Date: 20201211
Implantable Lead Implant Date: 20201211
Implantable Lead Location: 753859
Implantable Lead Location: 753860
Implantable Lead Model: 5076
Implantable Lead Model: 5076
Implantable Pulse Generator Implant Date: 20201211
Lead Channel Impedance Value: 285 Ohm
Lead Channel Impedance Value: 342 Ohm
Lead Channel Impedance Value: 418 Ohm
Lead Channel Impedance Value: 513 Ohm
Lead Channel Pacing Threshold Amplitude: 0.75 V
Lead Channel Pacing Threshold Pulse Width: 0.4 ms
Lead Channel Sensing Intrinsic Amplitude: 4 mV
Lead Channel Sensing Intrinsic Amplitude: 4 mV
Lead Channel Sensing Intrinsic Amplitude: 5.25 mV
Lead Channel Sensing Intrinsic Amplitude: 5.25 mV
Lead Channel Setting Pacing Amplitude: 2 V
Lead Channel Setting Pacing Amplitude: 2.5 V
Lead Channel Setting Pacing Pulse Width: 0.4 ms
Lead Channel Setting Sensing Sensitivity: 1.2 mV

## 2022-04-06 NOTE — Progress Notes (Signed)
Remote pacemaker transmission.   

## 2022-05-03 ENCOUNTER — Other Ambulatory Visit: Payer: Self-pay | Admitting: Internal Medicine

## 2022-05-11 ENCOUNTER — Other Ambulatory Visit: Payer: Self-pay | Admitting: Internal Medicine

## 2022-06-22 ENCOUNTER — Ambulatory Visit (INDEPENDENT_AMBULATORY_CARE_PROVIDER_SITE_OTHER): Payer: Medicare Other

## 2022-06-22 DIAGNOSIS — I441 Atrioventricular block, second degree: Secondary | ICD-10-CM | POA: Diagnosis not present

## 2022-06-22 LAB — CUP PACEART REMOTE DEVICE CHECK
Battery Remaining Longevity: 127 mo
Battery Voltage: 3.01 V
Brady Statistic AP VP Percent: 9.01 %
Brady Statistic AP VS Percent: 0.1 %
Brady Statistic AS VP Percent: 41.43 %
Brady Statistic AS VS Percent: 49.46 %
Brady Statistic RA Percent Paced: 12.96 %
Brady Statistic RV Percent Paced: 50.16 %
Date Time Interrogation Session: 20231207201352
Implantable Lead Connection Status: 753985
Implantable Lead Connection Status: 753985
Implantable Lead Implant Date: 20201211
Implantable Lead Implant Date: 20201211
Implantable Lead Location: 753859
Implantable Lead Location: 753860
Implantable Lead Model: 5076
Implantable Lead Model: 5076
Implantable Pulse Generator Implant Date: 20201211
Lead Channel Impedance Value: 323 Ohm
Lead Channel Impedance Value: 361 Ohm
Lead Channel Impedance Value: 437 Ohm
Lead Channel Impedance Value: 532 Ohm
Lead Channel Pacing Threshold Amplitude: 0.75 V
Lead Channel Pacing Threshold Pulse Width: 0.4 ms
Lead Channel Sensing Intrinsic Amplitude: 4 mV
Lead Channel Sensing Intrinsic Amplitude: 4 mV
Lead Channel Sensing Intrinsic Amplitude: 5.5 mV
Lead Channel Sensing Intrinsic Amplitude: 5.5 mV
Lead Channel Setting Pacing Amplitude: 2 V
Lead Channel Setting Pacing Amplitude: 2.5 V
Lead Channel Setting Pacing Pulse Width: 0.4 ms
Lead Channel Setting Sensing Sensitivity: 1.2 mV
Zone Setting Status: 755011
Zone Setting Status: 755011

## 2022-07-02 ENCOUNTER — Other Ambulatory Visit: Payer: Self-pay | Admitting: Internal Medicine

## 2022-07-02 DIAGNOSIS — I10 Essential (primary) hypertension: Secondary | ICD-10-CM

## 2022-07-13 NOTE — Progress Notes (Signed)
Remote pacemaker transmission.   

## 2022-07-17 ENCOUNTER — Other Ambulatory Visit: Payer: Self-pay | Admitting: Internal Medicine

## 2022-07-31 DIAGNOSIS — R159 Full incontinence of feces: Secondary | ICD-10-CM | POA: Diagnosis not present

## 2022-08-11 ENCOUNTER — Other Ambulatory Visit: Payer: Self-pay | Admitting: Internal Medicine

## 2022-08-29 ENCOUNTER — Telehealth: Payer: Self-pay | Admitting: Internal Medicine

## 2022-08-29 MED ORDER — OMEPRAZOLE 20 MG PO CPDR: 20.0000 mg | DELAYED_RELEASE_CAPSULE | ORAL | 1 refills | Status: AC

## 2022-08-29 NOTE — Telephone Encounter (Signed)
Refill sent and patient is aware. 

## 2022-08-29 NOTE — Telephone Encounter (Signed)
Pt is calling and she does not have the bottle she throw the bottle away and would like to know the name of dietary medication she suppose to take for  her digestive system

## 2022-09-14 ENCOUNTER — Other Ambulatory Visit: Payer: Self-pay | Admitting: Internal Medicine

## 2022-09-14 DIAGNOSIS — Z1231 Encounter for screening mammogram for malignant neoplasm of breast: Secondary | ICD-10-CM

## 2022-09-21 ENCOUNTER — Ambulatory Visit: Payer: Medicare Other

## 2022-09-21 DIAGNOSIS — I441 Atrioventricular block, second degree: Secondary | ICD-10-CM

## 2022-09-21 LAB — CUP PACEART REMOTE DEVICE CHECK
Battery Remaining Longevity: 124 mo
Battery Voltage: 3.01 V
Brady Statistic AP VP Percent: 9.48 %
Brady Statistic AP VS Percent: 0.14 %
Brady Statistic AS VP Percent: 40.09 %
Brady Statistic AS VS Percent: 50.29 %
Brady Statistic RA Percent Paced: 13.52 %
Brady Statistic RV Percent Paced: 49.42 %
Date Time Interrogation Session: 20240307204800
Implantable Lead Connection Status: 753985
Implantable Lead Connection Status: 753985
Implantable Lead Implant Date: 20201211
Implantable Lead Implant Date: 20201211
Implantable Lead Location: 753859
Implantable Lead Location: 753860
Implantable Lead Model: 5076
Implantable Lead Model: 5076
Implantable Pulse Generator Implant Date: 20201211
Lead Channel Impedance Value: 323 Ohm
Lead Channel Impedance Value: 361 Ohm
Lead Channel Impedance Value: 437 Ohm
Lead Channel Impedance Value: 513 Ohm
Lead Channel Pacing Threshold Amplitude: 0.625 V
Lead Channel Pacing Threshold Pulse Width: 0.4 ms
Lead Channel Sensing Intrinsic Amplitude: 4.25 mV
Lead Channel Sensing Intrinsic Amplitude: 4.25 mV
Lead Channel Sensing Intrinsic Amplitude: 5.375 mV
Lead Channel Sensing Intrinsic Amplitude: 5.375 mV
Lead Channel Setting Pacing Amplitude: 2 V
Lead Channel Setting Pacing Amplitude: 2.5 V
Lead Channel Setting Pacing Pulse Width: 0.4 ms
Lead Channel Setting Sensing Sensitivity: 1.2 mV
Zone Setting Status: 755011
Zone Setting Status: 755011

## 2022-10-01 NOTE — Progress Notes (Signed)
Patient ID: KIMYATA IDA MRN: [id number]; DOB: 1935-05-25  Admit date: (Not on file) Date of Consult: 10/01/2022  Primary Care Provider: Isaac Bliss, Rayford Halsted, MD Primary Cardiologist: Johnsie Cancel Primary Electrophysiologist:  Quentin Ore    History of Present Illness:   Ms. Whittier 88 y.o. no history of cardiac disease. First seen 11/07/18  CRF;s HTN and HLD history of gout , vertigo and pneumonia. Pevious giant cell arteritis now off prednisone Seen by primary 09/23/27 with 2 week complaint of palpitations. Heart beating in her neck Occurs daily Gets fatigue and needs to rest No associated syncope dyspnea or chest pain Pulse was 72 in office with NSR on ECG Echo done 08/12/16 for dizziness was normal with EF 60-65% just mild AR and aortic root 4.2 cm Carotid done for same reason plaque no stenosis in ICA;s   She is widowed over 77 years Raised 4 kids with youngest son living with her   Monitor reviewed 12/26/18 with rare PAF rates 150 but frequent episodes of 2:1 AV block Had Medtronic dual chamber PPM placed 06/26/19   PACEART has shown normal function  and low burden PAF On low dose eliquis due to age and renal function   10/04/21 Cr 1.5 Hct 33.5 PLT 246   Youngest son died of cancer this month Has twins in town and another child in [place] Some left musculoskeletal shoulder pain     Past Medical History:  Diagnosis Date   Dizziness 08/12/2016   Giant cell arteritis ([place]) 08/13/2016   High cholesterol    Hypertension    Pneumonia    Second degree Mobitz II AV block    Tremor 02/24/2014   Vertigo 08/12/2016    Past Surgical History:  Procedure Laterality Date   BLADDER SURGERY     catract Left 04/06/2014   PACEMAKER IMPLANT N/A 06/26/2019   Procedure: PACEMAKER IMPLANT;  Surgeon: Thompson Grayer, MD;  Location: [facility];  Service: Cardiovascular;  Laterality: N/A;   VAGINAL HYSTERECTOMY       Home Medications:  Prior to Admission medications   Medication Sig Start Date End  Date Taking? Authorizing Provider  acetaminophen (TYLENOL) 325 MG tablet Take 650 mg by mouth every 6 (six) hours as needed for mild pain.    [provider]  allopurinol (ZYLOPRIM) 100 MG tablet Take 1 tablet (100 mg total) by mouth daily. 07/02/18   Isaac Bliss, Rayford Halsted, MD  amLODipine (NORVASC) 5 MG tablet Take 1 tablet (5 mg total) by mouth daily. 07/02/18   Isaac Bliss, Rayford Halsted, MD  aspirin 81 MG tablet Take 81 mg by mouth daily.    [provider]  atorvastatin (LIPITOR) 10 MG tablet Take 1 tablet (10 mg total) by mouth daily. Patient not taking: Reported on 09/23/2018 07/02/18   Isaac Bliss, Rayford Halsted, MD  dorzolamide (TRUSOPT) 2 % ophthalmic solution Place 1 drop into the left eye 2 (two) times daily.     [provider]  furosemide (LASIX) 20 MG tablet Take 0.5 tablets (10 mg total) by mouth daily. 07/02/18   Isaac Bliss, Rayford Halsted, MD  hydroxypropyl methylcellulose / hypromellose (ISOPTO TEARS / GONIOVISC) 2.5 % ophthalmic solution Place 1 drop into both eyes 3 (three) times daily as needed for dry eyes.    [provider]  meclizine (ANTIVERT) 12.5 MG tablet Take 1 tablet (12.5 mg total) by mouth 2 (two) times daily as needed for dizziness. 07/02/18   Isaac Bliss, Rayford Halsted, MD  potassium chloride (  K-DUR) 10 MEQ tablet TAKE 1 TABLET(10 MEQ) BY MOUTH DAILY 08/14/18   Isaac Bliss, Rayford Halsted, MD    Inpatient Medications: Scheduled Meds:  Continuous Infusions:  PRN Meds:   Allergies:   No Known Allergies  Social History:   Social History   Socioeconomic History   Marital status: Widowed    Spouse name: Not on file   Number of children: 4   Years of education: 12   Highest education level: Not on file  Occupational History    Employer: RETIRED    Comment: Retired  Tobacco Use   Smoking status: Never   Smokeless tobacco: Never  Vaping Use   Vaping Use: Never used  Substance and Sexual Activity   Alcohol use:  No    Alcohol/week: 0.0 standard drinks of alcohol   Drug use: No   Sexual activity: Not on file  Other Topics Concern   Not on file  Social History Narrative   Patient is widowed and her grandson stay with her.    Retired.   Education high school.   Right handed.   Caffeine coffee one cup daily.   Social Determinants of Health   Financial Resource Strain: Not on file  Food Insecurity: Not on file  Transportation Needs: Not on file  Physical Activity: Not on file  Stress: Not on file  Social Connections: Not on file  Intimate Partner Violence: Not on file    Family History:    Family History  Problem Relation Age of Onset   Heart attack Mother    Diabetes Mother    Stroke Father    High blood pressure Father      ROS:  Please see the history of present illness.   All other ROS reviewed and negative.     Physical Exam/Data:   There were no vitals filed for this visit.  @IOBRIEF @    01/03/2022    1:15 PM 10/04/2021    9:12 AM 09/04/2021    9:23 AM  Last 3 Weights  Weight (lbs) 165 lb 163 lb 9.6 oz 168 lb 6.4 oz  Weight (kg) 74.844 kg 74.208 kg 76.386 kg     There is no height or weight on file to calculate BMI.   Affect appropriate Healthy:  appears stated age 76: normal Neck supple with no adenopathy JVP normal no bruits no thyromegaly Lungs clear with no wheezing and good diaphragmatic motion Heart:  S1/S2 no murmur, no rub, gallop or click PMI normal pacer under left clavicle  Abdomen: benighn, BS positve, no tenderness, no AAA no bruit.  No HSM or HJR Distal pulses intact with no bruits No edema Neuro non-focal Skin warm and dry No muscular weakness   ECG:  11/07/18 SR rate 65 PR 236 low voltage no acute ST changes 10/08/2022 afib rate 72 low voltage non specific ST changes   Relevant CV Studies: Echo 08/12/16 Carotid 08/13/16 [place] 12/26/18  PACEART 09/21/22   Radiology/Studies:  No results found.  Assessment and Plan:   PAF:  Continue  eliquis low burden stable Check CBC/BMET On low dose eliquis due to age and renal function  HTN: Well controlled.  Continue current medications and low sodium Dash type diet.   HLD on statin labs with primary  Vertigo has antivert had normal MRI of head 08/12/16 Arteritis: f/u rheumatology off prednisone  Gout continue allopurinol quiescent  7.  PPM:  medtronic 06/26/19 implant f/u Quentin Ore normal function by  PACEART 09/21/22   CBC/BMET   F/U with DR Quentin Ore 6 month  and me in a year    For questions or updates, please contact [place] Please consult [email or web address] for contact info under     Signed, Jenkins Rouge, MD  10/01/2022 7:38 PM

## 2022-10-08 ENCOUNTER — Encounter: Payer: Self-pay | Admitting: Cardiovascular Disease

## 2022-10-08 ENCOUNTER — Ambulatory Visit: Payer: Medicare Other | Attending: Cardiovascular Disease | Admitting: Cardiovascular Disease

## 2022-10-08 VITALS — BP 120/60 | HR 64 | Ht 66.0 in | Wt 162.8 lb

## 2022-10-08 DIAGNOSIS — I480 Paroxysmal atrial fibrillation: Secondary | ICD-10-CM | POA: Diagnosis not present

## 2022-10-08 DIAGNOSIS — I10 Essential (primary) hypertension: Secondary | ICD-10-CM | POA: Diagnosis not present

## 2022-10-08 DIAGNOSIS — Z950 Presence of cardiac pacemaker: Secondary | ICD-10-CM | POA: Diagnosis not present

## 2022-10-08 NOTE — Patient Instructions (Signed)
Medication Instructions:  Your physician recommends that you continue on your current medications as directed. Please refer to the Current Medication list given to you today.  *If you need a refill on your cardiac medications before your next appointment, please call your pharmacy*  Lab Work: Your physician recommends that you have lab work today- BMET and CBC  If you have labs (blood work) drawn today and your tests are completely normal, you will receive your results only by: MyChart Message (if you have MyChart) OR A paper copy in the mail If you have any lab test that is abnormal or we need to change your treatment, we will call you to review the results.  Follow-Up: At [facility], you and your health needs are our priority.  As part of our continuing mission to provide you with exceptional heart care, we have created designated Provider Care Teams.  These Care Teams include your primary Cardiologist (physician) and Advanced Practice Providers (APPs -  Physician Assistants and Nurse Practitioners) who all work together to provide you with the care you need, when you need it.  We recommend signing up for the patient portal called "MyChart".  Sign up information is provided on this After Visit Summary.  MyChart is used to connect with patients for Virtual Visits (Telemedicine).  Patients are able to view lab/test results, encounter notes, upcoming appointments, etc.  Non-urgent messages can be sent to your provider as well.   To learn more about what you can do with MyChart, go to [email or web address].    Your next appointment:   1 year(s)  Provider:   Jenkins Rouge, MD

## 2022-10-09 DIAGNOSIS — H40052 Ocular hypertension, left eye: Secondary | ICD-10-CM | POA: Diagnosis not present

## 2022-10-09 DIAGNOSIS — H402223 Chronic angle-closure glaucoma, left eye, severe stage: Secondary | ICD-10-CM | POA: Diagnosis not present

## 2022-10-09 DIAGNOSIS — H402211 Chronic angle-closure glaucoma, right eye, mild stage: Secondary | ICD-10-CM | POA: Diagnosis not present

## 2022-10-09 LAB — BASIC METABOLIC PANEL
BUN/Creatinine Ratio: 17 (ref 12–28)
BUN: 21 mg/dL (ref 8–27)
CO2: 19 mmol/L — ABNORMAL LOW (ref 20–29)
Calcium: 9.6 mg/dL (ref 8.7–10.3)
Chloride: 106 mmol/L (ref 96–106)
Creatinine, Ser: 1.23 mg/dL — ABNORMAL HIGH (ref 0.57–1.00)
Glucose: 88 mg/dL (ref 70–99)
Potassium: 4.6 mmol/L (ref 3.5–5.2)
Sodium: 140 mmol/L (ref 134–144)
eGFR: 43 mL/min/{1.73_m2} — ABNORMAL LOW (ref >59)

## 2022-10-09 LAB — CBC WITH DIFFERENTIAL/PLATELET
Basophils Absolute: 0 10*3/uL (ref 0.0–0.2)
Basos: 1 %
EOS (ABSOLUTE): 0.2 10*3/uL (ref 0.0–0.4)
Eos: 4 %
Hematocrit: 35.9 % (ref 34.0–46.6)
Hemoglobin: 11.7 g/dL (ref 11.1–15.9)
Immature Grans (Abs): 0 10*3/uL (ref 0.0–0.1)
Immature Granulocytes: 0 %
Lymphocytes Absolute: 2.1 10*3/uL (ref 0.7–3.1)
Lymphs: 40 %
MCH: 29.6 pg (ref 26.6–33.0)
MCHC: 32.6 g/dL (ref 31.5–35.7)
MCV: 91 fL (ref 79–97)
Monocytes Absolute: 0.8 10*3/uL (ref 0.1–0.9)
Monocytes: 15 %
Neutrophils Absolute: 2.2 10*3/uL (ref 1.4–7.0)
Neutrophils: 40 %
Platelets: 225 10*3/uL (ref 150–450)
RBC: 3.95 x10E6/uL (ref 3.77–5.28)
RDW: 13 % (ref 11.7–15.4)
WBC: 5.3 10*3/uL (ref 3.4–10.8)

## 2022-10-10 ENCOUNTER — Telehealth: Payer: Self-pay

## 2022-10-10 MED ORDER — APIXABAN 2.5 MG PO TABS: 2.5000 mg | ORAL_TABLET | Freq: Two times a day (BID) | ORAL | 3 refills | Status: DC

## 2022-10-10 NOTE — Telephone Encounter (Signed)
The patient has been notified of the result and verbalized understanding.  All questions (if any) were answered. Ewell Poe Delacroix, RN 10/10/2022 5:24 PM

## 2022-10-10 NOTE — Telephone Encounter (Signed)
-----   Message from Josue Hector, MD sent at 10/09/2022  8:17 AM EDT ----- Labs ok for low dose eliquis

## 2022-10-16 ENCOUNTER — Other Ambulatory Visit: Payer: Self-pay | Admitting: Internal Medicine

## 2022-10-16 DIAGNOSIS — I10 Essential (primary) hypertension: Secondary | ICD-10-CM

## 2022-10-19 NOTE — Progress Notes (Signed)
Remote pacemaker transmission.   

## 2022-11-02 ENCOUNTER — Ambulatory Visit
Admission: RE | Admit: 2022-11-02 | Discharge: 2022-11-02 | Disposition: A | Discharge status: Home / Self Care | Payer: Medicare Other | Source: Ambulatory Visit | Attending: Internal Medicine | Admitting: Internal Medicine

## 2022-11-02 DIAGNOSIS — Z1231 Encounter for screening mammogram for malignant neoplasm of breast: Secondary | ICD-10-CM

## 2022-11-13 ENCOUNTER — Other Ambulatory Visit: Payer: Self-pay | Admitting: Internal Medicine

## 2022-11-13 DIAGNOSIS — I10 Essential (primary) hypertension: Secondary | ICD-10-CM

## 2022-12-20 NOTE — Progress Notes (Signed)
  Electrophysiology Office Follow up Visit Note:    Date:  12/21/2022   ID:  Lori Wilson, DOB [birth year], MRN [id number]  PCP:  Philip Aspen, Limmie Patricia, MD  [facility] Cardiologist:  Charlton Haws, MD  [facility] Electrophysiologist:  Lanier Prude, MD    Interval History:    Lori Wilson is a 88 y.o. female who presents for a follow up visit.   I last saw the patient January 03, 2022 for her permanent pacemaker and history of atrial fibrillation.  She has been doing well since I last saw her.  No problems with her pacemaker.     Past medical, surgical, social and family history were reviewed.  ROS:   Please see the history of present illness.    All other systems reviewed and are negative.  EKGs/Labs/Other Studies Reviewed:    The following studies were reviewed today:  December 21, 2022 in clinic device interrogation personally reviewed Battery longevity 10.2 years Lead parameter stable      Physical Exam:    VS:  BP 112/64   Pulse (!) 55   Ht 5\' 6"  (1.676 m)   Wt 153 lb 6.4 oz (69.6 kg)   SpO2 96%   BMI 24.76 kg/m     Wt Readings from Last 3 Encounters:  12/21/22 153 lb 6.4 oz (69.6 kg)  10/08/22 162 lb 12.8 oz (73.8 kg)  01/03/22 165 lb (74.8 kg)     GEN:  Well nourished, well developed in no acute distress CARDIAC: RRR, no murmurs, rubs, gallops.  Pacemaker pocket well-healed RESPIRATORY:  Clear to auscultation without rales, wheezing or rhonchi       ASSESSMENT:    1. Second degree AV block   2. Pacemaker   3. PAF (paroxysmal atrial fibrillation) (HCC)    PLAN:    In order of problems listed above:  #Second-degree AV block #Permanent pacemaker in situ Device functioning appropriately.  Continue remote monitoring.  #Paroxysmal atrial fibrillation Low burden On Eliquis  Follow-up 1 year with APP.    Signed, Steffanie Dunn, MD, [facility], [facility] 12/21/2022 1:33 PM    Electrophysiology [facility]

## 2022-12-21 ENCOUNTER — Ambulatory Visit (INDEPENDENT_AMBULATORY_CARE_PROVIDER_SITE_OTHER): Payer: Medicare Other

## 2022-12-21 ENCOUNTER — Encounter: Payer: Self-pay | Admitting: Cardiology

## 2022-12-21 ENCOUNTER — Ambulatory Visit: Payer: Medicare Other | Attending: Cardiology | Admitting: Cardiology

## 2022-12-21 VITALS — BP 112/64 | HR 55 | Ht 66.0 in | Wt 153.4 lb

## 2022-12-21 DIAGNOSIS — I441 Atrioventricular block, second degree: Secondary | ICD-10-CM

## 2022-12-21 DIAGNOSIS — Z950 Presence of cardiac pacemaker: Secondary | ICD-10-CM

## 2022-12-21 DIAGNOSIS — I480 Paroxysmal atrial fibrillation: Secondary | ICD-10-CM | POA: Diagnosis not present

## 2022-12-21 LAB — CUP PACEART REMOTE DEVICE CHECK
Battery Remaining Longevity: 121 mo
Battery Voltage: 3 V
Brady Statistic AP VP Percent: 13.29 %
Brady Statistic AP VS Percent: 0.31 %
Brady Statistic AS VP Percent: 43.92 %
Brady Statistic AS VS Percent: 42.48 %
Brady Statistic RA Percent Paced: 17.95 %
Brady Statistic RV Percent Paced: 57.21 %
Date Time Interrogation Session: 20240606233733
Implantable Lead Connection Status: 753985
Implantable Lead Connection Status: 753985
Implantable Lead Implant Date: 20201211
Implantable Lead Implant Date: 20201211
Implantable Lead Location: 753859
Implantable Lead Location: 753860
Implantable Lead Model: 5076
Implantable Lead Model: 5076
Implantable Pulse Generator Implant Date: 20201211
Lead Channel Impedance Value: 361 Ohm
Lead Channel Impedance Value: 399 Ohm
Lead Channel Impedance Value: 399 Ohm
Lead Channel Impedance Value: 475 Ohm
Lead Channel Pacing Threshold Amplitude: 0.75 V
Lead Channel Pacing Threshold Pulse Width: 0.4 ms
Lead Channel Sensing Intrinsic Amplitude: 4 mV
Lead Channel Sensing Intrinsic Amplitude: 4 mV
Lead Channel Sensing Intrinsic Amplitude: 5.125 mV
Lead Channel Sensing Intrinsic Amplitude: 5.125 mV
Lead Channel Setting Pacing Amplitude: 2 V
Lead Channel Setting Pacing Amplitude: 2.5 V
Lead Channel Setting Pacing Pulse Width: 0.4 ms
Lead Channel Setting Sensing Sensitivity: 1.2 mV
Zone Setting Status: 755011
Zone Setting Status: 755011

## 2022-12-21 LAB — CUP PACEART INCLINIC DEVICE CHECK
Date Time Interrogation Session: 20240607120402
Implantable Lead Connection Status: 753985
Implantable Lead Connection Status: 753985
Implantable Lead Implant Date: 20201211
Implantable Lead Implant Date: 20201211
Implantable Lead Location: 753859
Implantable Lead Location: 753860
Implantable Lead Model: 5076
Implantable Lead Model: 5076
Implantable Pulse Generator Implant Date: 20201211

## 2022-12-21 MED ORDER — APIXABAN 5 MG PO TABS
5.0000 mg | ORAL_TABLET | Freq: Two times a day (BID) | ORAL | 1 refills | Status: DC
Start: 2022-12-21 — End: 2024-02-04

## 2022-12-21 NOTE — Patient Instructions (Addendum)
Medication Instructions:  Your physician recommends that you continue on your current medications as directed. Please refer to the Current Medication list given to you today.  *If you need a refill on your cardiac medications before your next appointment, please call your pharmacy*  Follow-Up: At [facility], you and your health needs are our priority.  As part of our continuing mission to provide you with exceptional heart care, we have created designated Provider Care Teams.  These Care Teams include your primary Cardiologist (physician) and Advanced Practice Providers (APPs -  Physician Assistants and Nurse Practitioners) who all work together to provide you with the care you need, when you need it.  Your next appointment:   1 year(s)  Provider:   You will see one of the following Advanced Practice Providers on your designated Care Team:   Renee Ursuy, PA-C Michael "Andy" Tillery, PA-C Suzann Riddle, NP  

## 2023-01-08 NOTE — Progress Notes (Signed)
Remote pacemaker transmission.   

## 2023-01-10 DIAGNOSIS — M549 Dorsalgia, unspecified: Secondary | ICD-10-CM | POA: Diagnosis not present

## 2023-01-10 DIAGNOSIS — M316 Other giant cell arteritis: Secondary | ICD-10-CM | POA: Diagnosis not present

## 2023-01-10 DIAGNOSIS — M1009 Idiopathic gout, multiple sites: Secondary | ICD-10-CM | POA: Diagnosis not present

## 2023-01-10 DIAGNOSIS — M79643 Pain in unspecified hand: Secondary | ICD-10-CM | POA: Diagnosis not present

## 2023-01-10 DIAGNOSIS — N289 Disorder of kidney and ureter, unspecified: Secondary | ICD-10-CM | POA: Diagnosis not present

## 2023-01-10 DIAGNOSIS — M150 Primary generalized (osteo)arthritis: Secondary | ICD-10-CM | POA: Diagnosis not present

## 2023-01-10 DIAGNOSIS — R768 Other specified abnormal immunological findings in serum: Secondary | ICD-10-CM | POA: Diagnosis not present

## 2023-02-11 ENCOUNTER — Other Ambulatory Visit: Payer: Self-pay | Admitting: Internal Medicine

## 2023-02-11 DIAGNOSIS — I10 Essential (primary) hypertension: Secondary | ICD-10-CM

## 2023-02-20 ENCOUNTER — Other Ambulatory Visit: Payer: Self-pay | Admitting: Internal Medicine

## 2023-02-20 DIAGNOSIS — I10 Essential (primary) hypertension: Secondary | ICD-10-CM

## 2023-03-07 DIAGNOSIS — H402223 Chronic angle-closure glaucoma, left eye, severe stage: Secondary | ICD-10-CM | POA: Diagnosis not present

## 2023-03-07 DIAGNOSIS — H40052 Ocular hypertension, left eye: Secondary | ICD-10-CM | POA: Diagnosis not present

## 2023-03-07 DIAGNOSIS — H402211 Chronic angle-closure glaucoma, right eye, mild stage: Secondary | ICD-10-CM | POA: Diagnosis not present

## 2023-03-07 DIAGNOSIS — H35372 Puckering of macula, left eye: Secondary | ICD-10-CM | POA: Diagnosis not present

## 2023-03-07 DIAGNOSIS — H35363 Drusen (degenerative) of macula, bilateral: Secondary | ICD-10-CM | POA: Diagnosis not present

## 2023-03-22 ENCOUNTER — Ambulatory Visit (INDEPENDENT_AMBULATORY_CARE_PROVIDER_SITE_OTHER): Payer: Medicare Other

## 2023-03-22 DIAGNOSIS — I441 Atrioventricular block, second degree: Secondary | ICD-10-CM

## 2023-03-22 LAB — CUP PACEART REMOTE DEVICE CHECK
Battery Remaining Longevity: 116 mo
Battery Voltage: 3 V
Brady Statistic AP VP Percent: 10.11 %
Brady Statistic AP VS Percent: 0.49 %
Brady Statistic AS VP Percent: 49.84 %
Brady Statistic AS VS Percent: 39.56 %
Brady Statistic RA Percent Paced: 13.77 %
Brady Statistic RV Percent Paced: 59.89 %
Date Time Interrogation Session: 20240906001245
Implantable Lead Connection Status: 753985
Implantable Lead Connection Status: 753985
Implantable Lead Implant Date: 20201211
Implantable Lead Implant Date: 20201211
Implantable Lead Location: 753859
Implantable Lead Location: 753860
Implantable Lead Model: 5076
Implantable Lead Model: 5076
Implantable Pulse Generator Implant Date: 20201211
Lead Channel Impedance Value: 323 Ohm
Lead Channel Impedance Value: 342 Ohm
Lead Channel Impedance Value: 399 Ohm
Lead Channel Impedance Value: 532 Ohm
Lead Channel Pacing Threshold Amplitude: 0.75 V
Lead Channel Pacing Threshold Pulse Width: 0.4 ms
Lead Channel Sensing Intrinsic Amplitude: 3.875 mV
Lead Channel Sensing Intrinsic Amplitude: 3.875 mV
Lead Channel Sensing Intrinsic Amplitude: 5.125 mV
Lead Channel Sensing Intrinsic Amplitude: 5.125 mV
Lead Channel Setting Pacing Amplitude: 2 V
Lead Channel Setting Pacing Amplitude: 2.5 V
Lead Channel Setting Pacing Pulse Width: 0.4 ms
Lead Channel Setting Sensing Sensitivity: 1.2 mV
Zone Setting Status: 755011
Zone Setting Status: 755011

## 2023-03-26 ENCOUNTER — Encounter: Payer: Self-pay | Admitting: Internal Medicine

## 2023-03-26 ENCOUNTER — Ambulatory Visit (INDEPENDENT_AMBULATORY_CARE_PROVIDER_SITE_OTHER): Payer: Medicare Other | Admitting: Internal Medicine

## 2023-03-26 VITALS — BP 120/70 | HR 55 | Temp 98.1°F | Wt 156.0 lb

## 2023-03-26 DIAGNOSIS — R519 Headache, unspecified: Secondary | ICD-10-CM | POA: Diagnosis not present

## 2023-03-26 MED ORDER — CETIRIZINE HCL 10 MG PO TABS
10.0000 mg | ORAL_TABLET | Freq: Every day | ORAL | 11 refills | Status: AC
Start: 2023-03-26 — End: ?

## 2023-03-26 NOTE — Progress Notes (Signed)
Remote pacemaker transmission.   

## 2023-03-26 NOTE — Progress Notes (Signed)
Established Patient Office Visit     CC/Reason for Visit: Discuss headache  HPI: Lori Wilson is a 88 y.o. female who is coming in today for the above mentioned reasons. Past Medical History is significant for: Hypertension, hyperlipidemia, history of gout, history of second-degree heart block status post pacemaker A-fib.  She has also had giant cell arteritis in the past no longer on prednisone.  For the past couple weeks she has been having a frontal headache, no vision disturbances.  She has had an increased runny nose with congestion and mucus production.   Past Medical/Surgical History: Past Medical History:  Diagnosis Date   Dizziness 08/12/2016   Giant cell arteritis (HCC) 08/13/2016   High cholesterol    Hypertension    Pneumonia    Second degree Mobitz II AV block    Tremor 02/24/2014   Vertigo 08/12/2016    Past Surgical History:  Procedure Laterality Date   BLADDER SURGERY     catract Left 04/06/2014   PACEMAKER IMPLANT N/A 06/26/2019   Procedure: PACEMAKER IMPLANT;  Surgeon: Hillis Range, MD;  Location: [facility];  Service: Cardiovascular;  Laterality: N/A;   VAGINAL HYSTERECTOMY      Social History:  reports that she has never smoked. She has never used smokeless tobacco. She reports that she does not drink alcohol and does not use drugs.  Allergies: No Known Allergies  Family History:  Family History  Problem Relation Age of Onset   Heart attack Mother    Diabetes Mother    Stroke Father    High blood pressure Father      Current Outpatient Medications:    acetaminophen (TYLENOL) 325 MG tablet, Take 650 mg by mouth every 6 (six) hours as needed for mild pain., Disp: , Rfl:    allopurinol (ZYLOPRIM) 100 MG tablet, TAKE 1 TABLET(100 MG) BY MOUTH DAILY, Disp: 90 tablet, Rfl: 1   amLODipine (NORVASC) 5 MG tablet, TAKE 1 TABLET(5 MG) BY MOUTH DAILY, Disp: 90 tablet, Rfl: 0   apixaban (ELIQUIS) 5 MG TABS tablet, Take 1 tablet (5 mg total) by  mouth 2 (two) times daily., Disp: 180 tablet, Rfl: 1   atorvastatin (LIPITOR) 20 MG tablet, TAKE 1 TABLET(20 MG) BY MOUTH DAILY, Disp: 90 tablet, Rfl: 0   cetirizine (ZYRTEC) 10 MG tablet, Take 1 tablet (10 mg total) by mouth daily., Disp: 30 tablet, Rfl: 11   dorzolamide-timolol (COSOPT) 2-0.5 % ophthalmic solution, Place 1 drop into the left eye 2 (two) times daily., Disp: , Rfl:    furosemide (LASIX) 20 MG tablet, TAKE 1/2 TABLET(10 MG) BY MOUTH DAILY, Disp: 45 tablet, Rfl: 1   meclizine (ANTIVERT) 12.5 MG tablet, Take 12.5 mg by mouth 3 (three) times daily as needed for dizziness., Disp: , Rfl:    omeprazole (PRILOSEC) 20 MG capsule, Take 1 capsule (20 mg total) by mouth every other day., Disp: 90 capsule, Rfl: 1   potassium chloride (KLOR-CON) 10 MEQ tablet, TAKE 1 TABLET(10 MEQ) BY MOUTH DAILY, Disp: 90 tablet, Rfl: 0   triamcinolone (KENALOG) 0.1 % paste, as needed., Disp: , Rfl:   Review of Systems:  Negative unless indicated in HPI.   Physical Exam: Vitals:   03/26/23 0957  BP: 120/70  Pulse: (!) 55  Temp: 98.1 F (36.7 C)  TempSrc: Oral  SpO2: 100%  Weight: 156 lb (70.8 kg)    Body mass index is 25.18 kg/m.   Physical Exam Vitals reviewed.  Constitutional:  Appearance: Normal appearance.  HENT:     Right Ear: Tympanic membrane, ear canal and external ear normal.     Left Ear: Tympanic membrane, ear canal and external ear normal.     Mouth/Throat:     Mouth: Mucous membranes are moist.     Pharynx: Oropharynx is clear.  Eyes:     Conjunctiva/sclera: Conjunctivae normal.     Pupils: Pupils are equal, round, and reactive to light.  Cardiovascular:     Rate and Rhythm: Normal rate and regular rhythm.  Pulmonary:     Effort: Pulmonary effort is normal.     Breath sounds: Normal breath sounds.  Neurological:     Mental Status: She is alert.      Impression and Plan:  Sinus headache -     Cetirizine HCl; Take 1 tablet (10 mg total) by mouth daily.   Dispense: 30 tablet; Refill: 11  -Suspect headache is sinus mediated given other symptoms, is not classic for giant cell arteritis which she has had in the past.   Time spent:23 minutes reviewing chart, interviewing and examining patient and formulating plan of care.     Chaya Jan, MD [facility]

## 2023-05-16 ENCOUNTER — Other Ambulatory Visit: Payer: Self-pay | Admitting: Internal Medicine

## 2023-05-24 ENCOUNTER — Other Ambulatory Visit: Payer: Self-pay | Admitting: Internal Medicine

## 2023-05-24 DIAGNOSIS — I10 Essential (primary) hypertension: Secondary | ICD-10-CM

## 2023-06-18 ENCOUNTER — Encounter: Payer: Medicare Other | Admitting: Internal Medicine

## 2023-06-18 DIAGNOSIS — I10 Essential (primary) hypertension: Secondary | ICD-10-CM

## 2023-06-18 DIAGNOSIS — E782 Mixed hyperlipidemia: Secondary | ICD-10-CM

## 2023-06-18 DIAGNOSIS — D638 Anemia in other chronic diseases classified elsewhere: Secondary | ICD-10-CM

## 2023-06-18 DIAGNOSIS — E559 Vitamin D deficiency, unspecified: Secondary | ICD-10-CM

## 2023-06-18 DIAGNOSIS — N1832 Chronic kidney disease, stage 3b: Secondary | ICD-10-CM

## 2023-06-21 ENCOUNTER — Ambulatory Visit (INDEPENDENT_AMBULATORY_CARE_PROVIDER_SITE_OTHER): Payer: Medicare Other

## 2023-06-21 DIAGNOSIS — I441 Atrioventricular block, second degree: Secondary | ICD-10-CM | POA: Diagnosis not present

## 2023-06-21 LAB — CUP PACEART REMOTE DEVICE CHECK
Battery Remaining Longevity: 115 mo
Battery Voltage: 3 V
Brady Statistic AP VP Percent: 7.66 %
Brady Statistic AP VS Percent: 0.09 %
Brady Statistic AS VP Percent: 27.52 %
Brady Statistic AS VS Percent: 64.72 %
Brady Statistic RA Percent Paced: 11.14 %
Brady Statistic RV Percent Paced: 35.19 %
Date Time Interrogation Session: 20241205192606
Implantable Lead Connection Status: 753985
Implantable Lead Connection Status: 753985
Implantable Lead Implant Date: 20201211
Implantable Lead Implant Date: 20201211
Implantable Lead Location: 753859
Implantable Lead Location: 753860
Implantable Lead Model: 5076
Implantable Lead Model: 5076
Implantable Pulse Generator Implant Date: 20201211
Lead Channel Impedance Value: 342 Ohm
Lead Channel Impedance Value: 380 Ohm
Lead Channel Impedance Value: 456 Ohm
Lead Channel Impedance Value: 589 Ohm
Lead Channel Pacing Threshold Amplitude: 0.625 V
Lead Channel Pacing Threshold Pulse Width: 0.4 ms
Lead Channel Sensing Intrinsic Amplitude: 3.875 mV
Lead Channel Sensing Intrinsic Amplitude: 3.875 mV
Lead Channel Sensing Intrinsic Amplitude: 4.375 mV
Lead Channel Sensing Intrinsic Amplitude: 4.375 mV
Lead Channel Setting Pacing Amplitude: 2 V
Lead Channel Setting Pacing Amplitude: 2.5 V
Lead Channel Setting Pacing Pulse Width: 0.4 ms
Lead Channel Setting Sensing Sensitivity: 1.2 mV
Zone Setting Status: 755011
Zone Setting Status: 755011

## 2023-08-05 ENCOUNTER — Encounter: Payer: Self-pay | Admitting: Internal Medicine

## 2023-08-05 ENCOUNTER — Ambulatory Visit (INDEPENDENT_AMBULATORY_CARE_PROVIDER_SITE_OTHER): Payer: Medicare PPO | Admitting: Internal Medicine

## 2023-08-05 VITALS — BP 102/80 | HR 70 | Temp 97.8°F | Ht 63.0 in | Wt 161.5 lb

## 2023-08-05 DIAGNOSIS — I10 Essential (primary) hypertension: Secondary | ICD-10-CM

## 2023-08-05 DIAGNOSIS — Z0000 Encounter for general adult medical examination without abnormal findings: Secondary | ICD-10-CM | POA: Diagnosis not present

## 2023-08-05 DIAGNOSIS — E782 Mixed hyperlipidemia: Secondary | ICD-10-CM | POA: Diagnosis not present

## 2023-08-05 DIAGNOSIS — Z780 Asymptomatic menopausal state: Secondary | ICD-10-CM

## 2023-08-05 DIAGNOSIS — R300 Dysuria: Secondary | ICD-10-CM

## 2023-08-05 DIAGNOSIS — D638 Anemia in other chronic diseases classified elsewhere: Secondary | ICD-10-CM

## 2023-08-05 DIAGNOSIS — N1832 Chronic kidney disease, stage 3b: Secondary | ICD-10-CM | POA: Diagnosis not present

## 2023-08-05 DIAGNOSIS — E559 Vitamin D deficiency, unspecified: Secondary | ICD-10-CM | POA: Diagnosis not present

## 2023-08-05 DIAGNOSIS — N3000 Acute cystitis without hematuria: Secondary | ICD-10-CM

## 2023-08-05 LAB — POC URINALSYSI DIPSTICK (AUTOMATED)
Bilirubin, UA: NEGATIVE
Blood, UA: NEGATIVE
Glucose, UA: NEGATIVE
Ketones, UA: NEGATIVE
Nitrite, UA: POSITIVE
Protein, UA: POSITIVE — AB
Spec Grav, UA: 1.02 (ref 1.010–1.025)
Urobilinogen, UA: 0.2 U/dL
pH, UA: 6 (ref 5.0–8.0)

## 2023-08-05 LAB — URINALYSIS, ROUTINE W REFLEX MICROSCOPIC
Bilirubin Urine: NEGATIVE
Hgb urine dipstick: NEGATIVE
Ketones, ur: NEGATIVE
Leukocytes,Ua: NEGATIVE
Nitrite: POSITIVE — AB
RBC / HPF: NONE SEEN (ref 0–?)
Specific Gravity, Urine: 1.02 (ref 1.000–1.030)
Total Protein, Urine: NEGATIVE
Urine Glucose: NEGATIVE
Urobilinogen, UA: 0.2 (ref 0.0–1.0)
pH: 6 (ref 5.0–8.0)

## 2023-08-05 MED ORDER — SULFAMETHOXAZOLE-TRIMETHOPRIM 800-160 MG PO TABS: 1.0000 | ORAL_TABLET | Freq: Two times a day (BID) | ORAL | 0 refills | Status: AC

## 2023-08-05 NOTE — Progress Notes (Signed)
Established Patient Office Visit     CC/Reason for Visit: Annual preventive exam, subsequent Medicare wellness visit, discuss dysuria  HPI: Lori Wilson is a 89 y.o. female who is coming in today for the above mentioned reasons. Past Medical History is significant for: Hypertension, hyperlipidemia, gout, second-degree heart block, A-fib, history of giant cell arteritis.  She has been doing well.  For the past 3 days has been having some dysuria, increased frequency and foul odor of her urine.  She states she has had all her seasonal vaccines and will provide documentation to [place] at a later date.  She is overdue for bone density test she elects to defer all future cancer screening due to age.   Past Medical/Surgical History: Past Medical History:  Diagnosis Date   Dizziness 08/12/2016   Giant cell arteritis (HCC) 08/13/2016   High cholesterol    Hypertension    Pneumonia    Second degree Mobitz II AV block    Tremor 02/24/2014   Vertigo 08/12/2016    Past Surgical History:  Procedure Laterality Date   BLADDER SURGERY     catract Left 04/06/2014   PACEMAKER IMPLANT N/A 06/26/2019   Procedure: PACEMAKER IMPLANT;  Surgeon: Hillis Range, MD;  Location: [facility];  Service: Cardiovascular;  Laterality: N/A;   VAGINAL HYSTERECTOMY      Social History:  reports that she has never smoked. She has never used smokeless tobacco. She reports that she does not drink alcohol and does not use drugs.  Allergies: No Known Allergies  Family History:  Family History  Problem Relation Age of Onset   Heart attack Mother    Diabetes Mother    Stroke Father    High blood pressure Father      Current Outpatient Medications:    acetaminophen (TYLENOL) 325 MG tablet, Take 650 mg by mouth every 6 (six) hours as needed for mild pain., Disp: , Rfl:    allopurinol (ZYLOPRIM) 100 MG tablet, TAKE 1 TABLET(100 MG) BY MOUTH DAILY, Disp: 90 tablet, Rfl: 1   amLODipine (NORVASC) 5 MG  tablet, TAKE 1 TABLET(5 MG) BY MOUTH DAILY, Disp: 90 tablet, Rfl: 0   apixaban (ELIQUIS) 5 MG TABS tablet, Take 1 tablet (5 mg total) by mouth 2 (two) times daily., Disp: 180 tablet, Rfl: 1   atorvastatin (LIPITOR) 20 MG tablet, TAKE 1 TABLET(20 MG) BY MOUTH DAILY, Disp: 90 tablet, Rfl: 0   cetirizine (ZYRTEC) 10 MG tablet, Take 1 tablet (10 mg total) by mouth daily., Disp: 30 tablet, Rfl: 11   dorzolamide-timolol (COSOPT) 2-0.5 % ophthalmic solution, Place 1 drop into the left eye 2 (two) times daily., Disp: , Rfl:    furosemide (LASIX) 20 MG tablet, TAKE 1/2 TABLET(10 MG) BY MOUTH DAILY, Disp: 45 tablet, Rfl: 1   meclizine (ANTIVERT) 12.5 MG tablet, Take 12.5 mg by mouth 3 (three) times daily as needed for dizziness., Disp: , Rfl:    omeprazole (PRILOSEC) 20 MG capsule, Take 1 capsule (20 mg total) by mouth every other day., Disp: 90 capsule, Rfl: 1   potassium chloride (KLOR-CON) 10 MEQ tablet, TAKE 1 TABLET(10 MEQ) BY MOUTH DAILY, Disp: 90 tablet, Rfl: 0   sulfamethoxazole-trimethoprim (BACTRIM DS) 800-160 MG tablet, Take 1 tablet by mouth 2 (two) times daily for 5 days., Disp: 10 tablet, Rfl: 0   triamcinolone (KENALOG) 0.1 % paste, as needed., Disp: , Rfl:   Review of Systems:  Negative unless indicated in HPI.   Physical  Exam: Vitals:   08/05/23 1331  BP: 102/80  Pulse: 70  Temp: 97.8 F (36.6 C)  TempSrc: Oral  SpO2: 99%  Weight: 161 lb 8 oz (73.3 kg)  Height: 5\' 3"  (1.6 m)    Body mass index is 28.61 kg/m.   Physical Exam Vitals reviewed.  Constitutional:      General: She is not in acute distress.    Appearance: Normal appearance. She is not ill-appearing, toxic-appearing or diaphoretic.  HENT:     Head: Normocephalic.     Right Ear: Tympanic membrane, ear canal and external ear normal. There is no impacted cerumen.     Left Ear: Tympanic membrane, ear canal and external ear normal. There is no impacted cerumen.     Nose: Nose normal.     Mouth/Throat:     Mouth:  Mucous membranes are moist.     Pharynx: Oropharynx is clear. No oropharyngeal exudate or posterior oropharyngeal erythema.  Eyes:     General: No scleral icterus.       Right eye: No discharge.        Left eye: No discharge.     Conjunctiva/sclera: Conjunctivae normal.     Pupils: Pupils are equal, round, and reactive to light.  Neck:     Vascular: No carotid bruit.  Cardiovascular:     Rate and Rhythm: Normal rate and regular rhythm.     Pulses: Normal pulses.     Heart sounds: Normal heart sounds.  Pulmonary:     Effort: Pulmonary effort is normal. No respiratory distress.     Breath sounds: Normal breath sounds.  Abdominal:     General: Abdomen is flat. Bowel sounds are normal.     Palpations: Abdomen is soft.  Musculoskeletal:        General: Normal range of motion.     Cervical back: Normal range of motion.  Skin:    General: Skin is warm and dry.  Neurological:     General: No focal deficit present.     Mental Status: She is alert and oriented to person, place, and time. Mental status is at baseline.  Psychiatric:        Mood and Affect: Mood normal.        Behavior: Behavior normal.        Thought Content: Thought content normal.        Judgment: Judgment normal.    Subsequent Medicare wellness visit   1. Risk factors, based on past  M,S,F - Cardiac Risk Factors include: advanced age (>36men, >26 women);dyslipidemia;hypertension   2.  Physical activities: Dietary issues and exercise activities discussed:      3.  Depression/mood:  Flowsheet Row Office Visit from 08/05/2023 in [facility] Total Score 1        4.  ADL's:    08/05/2023    1:23 PM  In your present state of health, do you have any difficulty performing the following activities:  Hearing? 0  Vision? 0  Difficulty concentrating or making decisions? 1  Walking or climbing stairs? 0  Dressing or bathing? 0  Doing errands, shopping? 0  Preparing Food and  eating ? N  Using the Toilet? N  In the past six months, have you accidently leaked urine? N  Do you have problems with loss of bowel control? N  Managing your Medications? N  Managing your Finances? N  Housekeeping or managing your Housekeeping? N     5.  Fall risk:     03/02/2021    9:14 AM 08/15/2021    2:18 PM 09/04/2021   10:08 AM 03/26/2023   10:24 AM 08/05/2023    1:25 PM  Fall Risk  Falls in the past year? 0 0 0 0 0  Was there an injury with Fall? 0 0 0 0 0  Fall Risk Category Calculator 0 0 0 0 0  Fall Risk Category (Retired) Low Low Low    (RETIRED) Patient Fall Risk Level  Low fall risk     Patient at Risk for Falls Due to  No Fall Risks     Fall risk Follow up  Falls evaluation completed Falls evaluation completed Falls evaluation completed Falls evaluation completed     6.  Home safety: No problems identified   7.  Height weight, and visual acuity: height and weight as above, vision/hearing: Vision Screening   Right eye Left eye Both eyes  Without correction 20/20 20/20 20/20   With correction        8.  Counseling: Counseling given: Not Answered    9. Lab orders based on risk factors: Laboratory update will be reviewed   10. Cognitive assessment:        08/05/2023    1:26 PM  6CIT Screen  What Year? 4 points  What month? 0 points  What time? 0 points  Count back from 20 0 points  Months in reverse 0 points  Repeat phrase 0 points  Total Score 4 points     11. Screening: Patient provided with a written and personalized 5-10 year screening schedule in the AVS. Health Maintenance  Topic Date Due   Pneumonia Vaccine (1 of 2 - PCV) 06/10/1941   DTaP/Tdap/Td vaccine (1 - Tdap) Never done   COVID-19 Vaccine (5 - 2024-25 season) 03/17/2023   Flu Shot  10/14/2023*   Medicare Annual Wellness Visit  08/04/2024   DEXA scan (bone density measurement)  Completed   Zoster (Shingles) Vaccine  Completed   HPV Vaccine  Aged Out  *Topic was postponed. The date  shown is not the original due date.    12. Provider List Update: Patient Care Team    Relationship Specialty Notifications Start End  Philip Aspen, Limmie Patricia, MD PCP - General Internal Medicine  07/02/18   Wendall Stade, MD PCP - Cardiology Cardiology Admissions 02/12/19   Lanier Prude, MD PCP - Electrophysiology Cardiology  12/18/22      13. Advance Directives: Does Patient Have a Medical Advance Directive?: No Would patient like information on creating a medical advance directive?: No - Patient declined  14. Opioids: Patient is not on any opioid prescriptions and has no risk factors for a substance use disorder.   15.   Goals      Protect My Health     Timeframe:  Long-Range Goal Priority:  Medium Start Date:                             Expected End Date:                         - schedule and keep appointment for annual check-up    Why is this important?   Screening tests can find diseases early when they are easier to treat.  Your doctor or nurse will talk with you about which tests are important for you.  Getting shots for  common diseases like the flu and shingles will help prevent them.     Notes:          I have personally reviewed and noted the following in the patient's chart:   Medical and social history Use of alcohol, tobacco or illicit drugs  Current medications and supplements Functional ability and status Nutritional status Physical activity Advanced directives List of other physicians Hospitalizations, surgeries, and ER visits in previous 12 months Vitals Screenings to include cognitive, depression, and falls Referrals and appointments  In addition, I have reviewed and discussed with patient certain preventive protocols, quality metrics, and best practice recommendations. A written personalized care plan for preventive services as well as general preventive health recommendations were provided to patient.   Impression and  Plan:  Medicare annual wellness visit, subsequent  Vitamin D deficiency -     VITAMIN D 25 Hydroxy (Vit-D Deficiency, Fractures); Future  Primary hypertension -     CBC with Differential/Platelet; Future -     Comprehensive metabolic panel; Future  Mixed hyperlipidemia -     Lipid panel; Future  Dysuria -     POCT Urinalysis Dipstick (Automated)  Stage 3b chronic kidney disease (HCC) -     TSH; Future -     Vitamin B12; Future -     VITAMIN D 25 Hydroxy (Vit-D Deficiency, Fractures); Future  Anemia, chronic disease  Acute cystitis without hematuria -     Sulfamethoxazole-Trimethoprim; Take 1 tablet by mouth 2 (two) times daily for 5 days.  Dispense: 10 tablet; Refill: 0  Postmenopausal estrogen deficiency -     DG Bone Density; Future   -Recommend routine eye and dental care. -Healthy lifestyle discussed in detail. -Labs to be updated today. -Prostate cancer screening: N/A Health Maintenance  Topic Date Due   Pneumonia Vaccine (1 of 2 - PCV) 06/10/1941   DTaP/Tdap/Td vaccine (1 - Tdap) Never done   COVID-19 Vaccine (5 - 2024-25 season) 03/17/2023   Flu Shot  10/14/2023*   Medicare Annual Wellness Visit  08/04/2024   DEXA scan (bone density measurement)  Completed   Zoster (Shingles) Vaccine  Completed   HPV Vaccine  Aged Out  *Topic was postponed. The date shown is not the original due date.     -DEXA scan requested. -In office urine dipstick positive for nitrates and leukocytes.  Send Bactrim for 5 days, send for urine culture.    Chaya Jan, MD [facility]

## 2023-08-06 LAB — COMPREHENSIVE METABOLIC PANEL
ALT: 67 U/L — ABNORMAL HIGH (ref 0–35)
AST: 100 U/L — ABNORMAL HIGH (ref 0–37)
Albumin: 3.4 g/dL — ABNORMAL LOW (ref 3.5–5.2)
Alkaline Phosphatase: 97 U/L (ref 39–117)
BUN: 25 mg/dL — ABNORMAL HIGH (ref 6–23)
CO2: 23 meq/L (ref 19–32)
Calcium: 9.5 mg/dL (ref 8.4–10.5)
Chloride: 102 meq/L (ref 96–112)
Creatinine, Ser: 1.1 mg/dL (ref 0.40–1.20)
GFR: 45.61 mL/min — ABNORMAL LOW (ref >60.00)
Glucose, Bld: 80 mg/dL (ref 70–99)
Potassium: 4.5 meq/L (ref 3.5–5.1)
Sodium: 132 meq/L — ABNORMAL LOW (ref 135–145)
Total Bilirubin: 1.1 mg/dL (ref 0.2–1.2)
Total Protein: 8.2 g/dL (ref 6.0–8.3)

## 2023-08-06 LAB — CBC WITH DIFFERENTIAL/PLATELET
Basophils Absolute: 0.1 10*3/uL (ref 0.0–0.1)
Basophils Relative: 1.5 % (ref 0.0–3.0)
Eosinophils Absolute: 0.4 10*3/uL (ref 0.0–0.7)
Eosinophils Relative: 7.6 % — ABNORMAL HIGH (ref 0.0–5.0)
HCT: 35.2 % — ABNORMAL LOW (ref 36.0–46.0)
Hemoglobin: 11.6 g/dL — ABNORMAL LOW (ref 12.0–15.0)
Lymphocytes Relative: 39.4 % (ref 12.0–46.0)
Lymphs Abs: 2.1 10*3/uL (ref 0.7–4.0)
MCHC: 33 g/dL (ref 30.0–36.0)
MCV: 98 fL (ref 78.0–100.0)
Monocytes Absolute: 0.7 10*3/uL (ref 0.1–1.0)
Monocytes Relative: 13.1 % — ABNORMAL HIGH (ref 3.0–12.0)
Neutro Abs: 2 10*3/uL (ref 1.4–7.7)
Neutrophils Relative %: 38.4 % — ABNORMAL LOW (ref 43.0–77.0)
Platelets: 191 10*3/uL (ref 150.0–400.0)
RBC: 3.59 Mil/uL — ABNORMAL LOW (ref 3.87–5.11)
RDW: 15.5 % (ref 11.5–15.5)
WBC: 5.2 10*3/uL (ref 4.0–10.5)

## 2023-08-06 LAB — LIPID PANEL
Cholesterol: 267 mg/dL — ABNORMAL HIGH (ref 0–200)
HDL: 58.2 mg/dL (ref >39.00)
LDL Cholesterol: 171 mg/dL — ABNORMAL HIGH (ref 0–99)
NonHDL: 208.99
Total CHOL/HDL Ratio: 5
Triglycerides: 188 mg/dL — ABNORMAL HIGH (ref 0.0–149.0)
VLDL: 37.6 mg/dL (ref 0.0–40.0)

## 2023-08-06 LAB — VITAMIN D 25 HYDROXY (VIT D DEFICIENCY, FRACTURES): VITD: 30.36 ng/mL (ref 30.00–100.00)

## 2023-08-06 LAB — TSH: TSH: 2.02 u[IU]/mL (ref 0.35–5.50)

## 2023-08-06 LAB — VITAMIN B12: Vitamin B-12: 524 pg/mL (ref 211–911)

## 2023-08-07 LAB — URINE CULTURE
MICRO NUMBER:: 15977020
SPECIMEN QUALITY:: ADEQUATE

## 2023-08-12 ENCOUNTER — Telehealth: Payer: Self-pay | Admitting: Internal Medicine

## 2023-08-12 MED ORDER — CIPROFLOXACIN HCL 250 MG PO TABS: 250.0000 mg | ORAL_TABLET | Freq: Two times a day (BID) | ORAL | 0 refills | Status: AC

## 2023-08-12 NOTE — Telephone Encounter (Signed)
Patient is calling in to  see if the office received the list of medications she takes she would like a call back regarding this patient is also stating that the medication she is taking for her infection is causing her to be sick

## 2023-08-12 NOTE — Telephone Encounter (Signed)
Patient is aware. Rx sent

## 2023-08-17 ENCOUNTER — Other Ambulatory Visit: Payer: Self-pay | Admitting: Internal Medicine

## 2023-08-17 DIAGNOSIS — I10 Essential (primary) hypertension: Secondary | ICD-10-CM

## 2023-08-21 ENCOUNTER — Other Ambulatory Visit: Payer: Self-pay | Admitting: *Deleted

## 2023-08-21 DIAGNOSIS — R748 Abnormal levels of other serum enzymes: Secondary | ICD-10-CM

## 2023-08-21 MED ORDER — ATORVASTATIN CALCIUM 40 MG PO TABS
40.0000 mg | ORAL_TABLET | Freq: Every day | ORAL | 3 refills | Status: AC
Start: 2023-08-21 — End: ?

## 2023-08-30 ENCOUNTER — Ambulatory Visit (HOSPITAL_BASED_OUTPATIENT_CLINIC_OR_DEPARTMENT_OTHER): Payer: Medicare Other

## 2023-09-02 ENCOUNTER — Ambulatory Visit: Payer: Self-pay | Admitting: Internal Medicine

## 2023-09-02 ENCOUNTER — Telehealth: Payer: Self-pay

## 2023-09-02 NOTE — Telephone Encounter (Signed)
 Copied from CRM [id number]. Topic: Clinical - Red Word Triage >> Sep 02, 2023 10:06 AM Sonny Dandy B wrote: Red Word that prompted transfer to Nurse Triage: pt is having serve headaches every morning when she takes her blood pressure medication. States provider recently increased the dosage.    Chief Complaint: Headaches Symptoms: Intermittent headaches, nausea Frequency: Ongoing since last Thursday Pertinent Negatives: Patient denies dizziness Disposition: [] ED /[] Urgent Care (no appt availability in office) / [x] Appointment(In office/virtual)/ []  [facility]/ [] Home Care/ [] Refused Recommended Disposition /[] [facility]/ []  Follow-up with PCP Additional Notes: Patient stated she has been having intermittent episodes of headaches since last Thursday. She suspects that it is related to her BP medicine Amlodipine because the headaches seem to occur right after taking it. She denies having a headache at this time. Patient stated she is taking the medication as prescribed and she has not missed any doses. Patient is not able to check her BP at the time of phone call. She stated her son usually checks it for her and he is not currently with her. Appointment scheduled with PCP tomorrow.   Reason for Disposition  [1] MILD-MODERATE headache AND [2] present > 72 hours  Answer Assessment - Initial Assessment Questions 1. LOCATION: "Where does it hurt?"      Headache, back of head and neck  2. ONSET: "When did the headache start?" (Minutes, hours or days)      Last week Thursday   3. PATTERN: "Does the pain come and go, or has it been constant since it started?"     Comes and goes  4. SEVERITY: "How bad is the pain?" and "What does it keep you from doing?"  (e.g., Scale 1-10; mild, moderate, or severe)   - MILD (1-3): doesn't interfere with normal activities    - MODERATE (4-7): interferes with normal activities or awakens from sleep    - SEVERE (8-10): excruciating pain, unable to  do any normal activities        No headache right now. When patient has headaches, it is 7/10  5. RECURRENT SYMPTOM: "Have you ever had headaches before?" If Yes, ask: "When was the last time?" and "What happened that time?"      Last time was yesterday  6. CAUSE: "What do you think is causing the headache?"     BP medication Amlodipine  7. MIGRAINE: "Have you been diagnosed with migraine headaches?" If Yes, ask: "Is this headache similar?"      No  8. HEAD INJURY: "Has there been any recent injury to the head?"      No  9. OTHER SYMPTOMS: "Do you have any other symptoms?" (fever, stiff neck, eye pain, sore throat, cold symptoms)  Sometimes has nausea  Protocols used: Headache-A-AH

## 2023-09-02 NOTE — Telephone Encounter (Signed)
 Copied from CRM [id number]. Topic: Clinical - Medical Advice >> Sep 02, 2023  9:59 AM Sonny Dandy B wrote: Reason for CRM: pt called to speak with provider regarding blood pressure medication . amLODipine (NORVASC) 5 MG tablet states she is having head aches after she is taking the medication   atorvastatin (LIPITOR) 40 MG tablet, furosemide (LASIX) 20 MG tablet, apixaban (ELIQUIS) 5 MG TABS tablet every morning when she takes the medication she gets these headaches,pt states she thinks her blood pressure medication is giving her a headache Pt is requesting the provider to decrease the dosage.  pt is requesting a call back at [phone]

## 2023-09-03 ENCOUNTER — Ambulatory Visit (INDEPENDENT_AMBULATORY_CARE_PROVIDER_SITE_OTHER): Payer: Medicare Other | Admitting: Internal Medicine

## 2023-09-03 ENCOUNTER — Encounter: Payer: Self-pay | Admitting: Internal Medicine

## 2023-09-03 VITALS — BP 130/80 | HR 59 | Temp 97.5°F | Wt 157.4 lb

## 2023-09-03 DIAGNOSIS — R519 Headache, unspecified: Secondary | ICD-10-CM | POA: Diagnosis not present

## 2023-09-03 NOTE — Progress Notes (Signed)
 Established Patient Office Visit     CC/Reason for Visit: Headache  HPI: Lori Wilson is a 89 y.o. female who is coming in today for the above mentioned reasons.  For the last week she has been experiencing bitemporal headaches that started at around midday.  She has had 3-4 episodes of these.  She has also noted some nasal congestion.  No vision deficits, no focal neurologic deficits.  She has her eye exam coming up next month.  When she has a headache she takes 2 Tylenol and responds well to it.  She wanted to come in as she believed her blood pressure was at fault but her blood pressure today is 130/80.   Past Medical/Surgical History: Past Medical History:  Diagnosis Date   Dizziness 08/12/2016   Giant cell arteritis (HCC) 08/13/2016   High cholesterol    Hypertension    Pneumonia    Second degree Mobitz II AV block    Tremor 02/24/2014   Vertigo 08/12/2016    Past Surgical History:  Procedure Laterality Date   BLADDER SURGERY     catract Left 04/06/2014   PACEMAKER IMPLANT N/A 06/26/2019   Procedure: PACEMAKER IMPLANT;  Surgeon: Hillis Range, MD;  Location: [facility];  Service: Cardiovascular;  Laterality: N/A;   VAGINAL HYSTERECTOMY      Social History:  reports that she has never smoked. She has never used smokeless tobacco. She reports that she does not drink alcohol and does not use drugs.  Allergies: No Known Allergies  Family History:  Family History  Problem Relation Age of Onset   Heart attack Mother    Diabetes Mother    Stroke Father    High blood pressure Father      Current Outpatient Medications:    acetaminophen (TYLENOL) 325 MG tablet, Take 650 mg by mouth every 6 (six) hours as needed for mild pain., Disp: , Rfl:    amLODipine (NORVASC) 5 MG tablet, TAKE 1 TABLET(5 MG) BY MOUTH DAILY, Disp: 90 tablet, Rfl: 1   apixaban (ELIQUIS) 5 MG TABS tablet, Take 1 tablet (5 mg total) by mouth 2 (two) times daily., Disp: 180 tablet, Rfl: 1    atorvastatin (LIPITOR) 40 MG tablet, Take 1 tablet (40 mg total) by mouth daily., Disp: 90 tablet, Rfl: 3   cetirizine (ZYRTEC) 10 MG tablet, Take 1 tablet (10 mg total) by mouth daily., Disp: 30 tablet, Rfl: 11   dorzolamide-timolol (COSOPT) 2-0.5 % ophthalmic solution, Place 1 drop into the left eye 2 (two) times daily., Disp: , Rfl:    furosemide (LASIX) 20 MG tablet, TAKE 1/2 TABLET(10 MG) BY MOUTH DAILY, Disp: 45 tablet, Rfl: 1   meclizine (ANTIVERT) 12.5 MG tablet, Take 12.5 mg by mouth 3 (three) times daily as needed for dizziness., Disp: , Rfl:    omeprazole (PRILOSEC) 20 MG capsule, Take 1 capsule (20 mg total) by mouth every other day., Disp: 90 capsule, Rfl: 1   potassium chloride (KLOR-CON) 10 MEQ tablet, TAKE 1 TABLET(10 MEQ) BY MOUTH DAILY, Disp: 90 tablet, Rfl: 0   triamcinolone (KENALOG) 0.1 % paste, as needed., Disp: , Rfl:    allopurinol (ZYLOPRIM) 100 MG tablet, TAKE 1 TABLET(100 MG) BY MOUTH DAILY (Patient not taking: Reported on 09/03/2023), Disp: 90 tablet, Rfl: 1  Review of Systems:  Negative unless indicated in HPI.   Physical Exam: Vitals:   09/03/23 1544  BP: 130/80  Pulse: (!) 59  Temp: (!) 97.5 F (36.4 C)  TempSrc: Oral  SpO2: 99%  Weight: 157 lb 6.4 oz (71.4 kg)    Body mass index is 27.88 kg/m.   Physical Exam Vitals reviewed.  Constitutional:      Appearance: Normal appearance.  HENT:     Head: Normocephalic and atraumatic.  Eyes:     Conjunctiva/sclera: Conjunctivae normal.  Cardiovascular:     Rate and Rhythm: Normal rate and regular rhythm.  Pulmonary:     Effort: Pulmonary effort is normal.     Breath sounds: Normal breath sounds.  Skin:    General: Skin is warm and dry.  Neurological:     General: No focal deficit present.     Mental Status: She is alert and oriented to person, place, and time.  Psychiatric:        Mood and Affect: Mood normal.        Behavior: Behavior normal.        Thought Content: Thought content normal.         Judgment: Judgment normal.      Impression and Plan:  Acute nonintractable headache, unspecified headache type  -Suspect either a tension headache or possibly a sinus headache.  Since it is helped by taking Tylenol and has only had 2 or 3 episodes of this and has no red flag symptoms, I do not believe further workup is necessary at this time.   Time spent:22 minutes reviewing chart, interviewing and examining patient and formulating plan of care.     Chaya Jan, MD [facility]

## 2023-09-03 NOTE — Telephone Encounter (Signed)
 Appointment scheduled for 09/02/22.

## 2023-09-05 ENCOUNTER — Ambulatory Visit (HOSPITAL_BASED_OUTPATIENT_CLINIC_OR_DEPARTMENT_OTHER): Payer: Medicare Other

## 2023-09-18 ENCOUNTER — Ambulatory Visit (HOSPITAL_BASED_OUTPATIENT_CLINIC_OR_DEPARTMENT_OTHER)
Admission: RE | Admit: 2023-09-18 | Discharge: 2023-09-18 | Disposition: A | Discharge status: Home / Self Care | Payer: Medicare Other | Source: Ambulatory Visit | Attending: Internal Medicine | Admitting: Internal Medicine

## 2023-09-18 DIAGNOSIS — Z780 Asymptomatic menopausal state: Secondary | ICD-10-CM | POA: Diagnosis present

## 2023-09-20 ENCOUNTER — Ambulatory Visit (INDEPENDENT_AMBULATORY_CARE_PROVIDER_SITE_OTHER): Payer: Medicare Other

## 2023-09-20 DIAGNOSIS — I441 Atrioventricular block, second degree: Secondary | ICD-10-CM

## 2023-09-22 LAB — CUP PACEART REMOTE DEVICE CHECK
Battery Remaining Longevity: 111 mo
Battery Voltage: 3 V
Brady Statistic AP VP Percent: 8.22 %
Brady Statistic AP VS Percent: 0.1 %
Brady Statistic AS VP Percent: 32.23 %
Brady Statistic AS VS Percent: 59.44 %
Brady Statistic RA Percent Paced: 11.89 %
Brady Statistic RV Percent Paced: 40.46 %
Date Time Interrogation Session: 20250306203107
Implantable Lead Connection Status: 753985
Implantable Lead Connection Status: 753985
Implantable Lead Implant Date: 20201211
Implantable Lead Implant Date: 20201211
Implantable Lead Location: 753859
Implantable Lead Location: 753860
Implantable Lead Model: 5076
Implantable Lead Model: 5076
Implantable Pulse Generator Implant Date: 20201211
Lead Channel Impedance Value: 323 Ohm
Lead Channel Impedance Value: 342 Ohm
Lead Channel Impedance Value: 399 Ohm
Lead Channel Impedance Value: 532 Ohm
Lead Channel Pacing Threshold Amplitude: 0.875 V
Lead Channel Pacing Threshold Pulse Width: 0.4 ms
Lead Channel Sensing Intrinsic Amplitude: 3.875 mV
Lead Channel Sensing Intrinsic Amplitude: 3.875 mV
Lead Channel Sensing Intrinsic Amplitude: 4.75 mV
Lead Channel Sensing Intrinsic Amplitude: 4.75 mV
Lead Channel Setting Pacing Amplitude: 2 V
Lead Channel Setting Pacing Amplitude: 2.5 V
Lead Channel Setting Pacing Pulse Width: 0.4 ms
Lead Channel Setting Sensing Sensitivity: 1.2 mV
Zone Setting Status: 755011
Zone Setting Status: 755011

## 2023-10-01 ENCOUNTER — Other Ambulatory Visit: Payer: Self-pay | Admitting: Internal Medicine

## 2023-10-01 DIAGNOSIS — Z1231 Encounter for screening mammogram for malignant neoplasm of breast: Secondary | ICD-10-CM

## 2023-10-02 ENCOUNTER — Other Ambulatory Visit (INDEPENDENT_AMBULATORY_CARE_PROVIDER_SITE_OTHER): Payer: Medicare PPO

## 2023-10-02 DIAGNOSIS — R748 Abnormal levels of other serum enzymes: Secondary | ICD-10-CM

## 2023-10-02 LAB — COMPREHENSIVE METABOLIC PANEL
ALT: 75 U/L — ABNORMAL HIGH (ref 0–35)
AST: 110 U/L — ABNORMAL HIGH (ref 0–37)
Albumin: 3.4 g/dL — ABNORMAL LOW (ref 3.5–5.2)
Alkaline Phosphatase: 112 U/L (ref 39–117)
BUN: 41 mg/dL — ABNORMAL HIGH (ref 6–23)
CO2: 24 meq/L (ref 19–32)
Calcium: 9.5 mg/dL (ref 8.4–10.5)
Chloride: 104 meq/L (ref 96–112)
Creatinine, Ser: 1.29 mg/dL — ABNORMAL HIGH (ref 0.40–1.20)
GFR: 37.63 mL/min — ABNORMAL LOW (ref >60.00)
Glucose, Bld: 80 mg/dL (ref 70–99)
Potassium: 4.6 meq/L (ref 3.5–5.1)
Sodium: 133 meq/L — ABNORMAL LOW (ref 135–145)
Total Bilirubin: 0.7 mg/dL (ref 0.2–1.2)
Total Protein: 8.2 g/dL (ref 6.0–8.3)

## 2023-10-03 ENCOUNTER — Other Ambulatory Visit: Payer: Self-pay | Admitting: Internal Medicine

## 2023-10-03 ENCOUNTER — Ambulatory Visit

## 2023-10-03 DIAGNOSIS — R7401 Elevation of levels of liver transaminase levels: Secondary | ICD-10-CM

## 2023-10-03 DIAGNOSIS — R748 Abnormal levels of other serum enzymes: Secondary | ICD-10-CM

## 2023-10-08 ENCOUNTER — Other Ambulatory Visit (INDEPENDENT_AMBULATORY_CARE_PROVIDER_SITE_OTHER)

## 2023-10-08 DIAGNOSIS — R748 Abnormal levels of other serum enzymes: Secondary | ICD-10-CM | POA: Diagnosis not present

## 2023-10-08 LAB — COMPREHENSIVE METABOLIC PANEL
ALT: 81 U/L — ABNORMAL HIGH (ref 0–35)
AST: 118 U/L — ABNORMAL HIGH (ref 0–37)
Albumin: 3.4 g/dL — ABNORMAL LOW (ref 3.5–5.2)
Alkaline Phosphatase: 104 U/L (ref 39–117)
BUN: 23 mg/dL (ref 6–23)
CO2: 22 meq/L (ref 19–32)
Calcium: 9.4 mg/dL (ref 8.4–10.5)
Chloride: 108 meq/L (ref 96–112)
Creatinine, Ser: 0.92 mg/dL (ref 0.40–1.20)
GFR: 56.45 mL/min — ABNORMAL LOW (ref >60.00)
Glucose, Bld: 82 mg/dL (ref 70–99)
Potassium: 4.1 meq/L (ref 3.5–5.1)
Sodium: 137 meq/L (ref 135–145)
Total Bilirubin: 0.9 mg/dL (ref 0.2–1.2)
Total Protein: 8.2 g/dL (ref 6.0–8.3)

## 2023-10-09 ENCOUNTER — Other Ambulatory Visit: Payer: Self-pay | Admitting: *Deleted

## 2023-10-09 ENCOUNTER — Other Ambulatory Visit: Payer: Self-pay | Admitting: Internal Medicine

## 2023-10-09 DIAGNOSIS — R7401 Elevation of levels of liver transaminase levels: Secondary | ICD-10-CM

## 2023-10-10 ENCOUNTER — Other Ambulatory Visit

## 2023-10-16 NOTE — Progress Notes (Deleted)
  Cardiology Office Note:  .   Date:  10/16/2023  ID:  Lori Wilson, DOB 07/13/1935, MRN [id number] PCP: Philip Aspen, Limmie Patricia, MD  [facility] Providers Cardiologist:  Charlton Haws, MD Electrophysiologist:  Lanier Prude, MD {  History of Present Illness: .   Lori Wilson is a 89 y.o. female with a past medical history of CRF, HTN, HLD, gout, vertigo, pneumonia who was originally seen in 2020.  Here for follow-up appointment.  History of previous giant cell arteritis was off prednisone and seen by primary with a 2-week complaint of palpitations.  Heart beating in her neck.  Occurs daily.  Gets fatigued and needs to rest.  No associated syncope, dyspnea, or chest pain.  Pulse of 72 when she was last seen in the office with normal sinus rhythm on EKG.  Echo was done back in January 2018 for dizziness and was normal with LVEF 65%, mild AR and aortic root 4.2 cm.  Carotids were done for the same reason which showed plaque, no stenosis in the ICAs.  Widowed over 40 years.  Raised 4 kids with youngest son living with her.  Monitor reviewed from June 2020 with rare PAF rates 150 but frequent episodes of 2-1 AV heart block.  Had Med[employer]onic dual-chamber PPM placed 06/26/2019.  On 10/04/2021 her creatinine was 1.5 hematocrit 33.5, platelets 246.  Youngest son unfortunately died of cancer right before her last office visit.  Has twins in town and another child in [place].  Was having some musculoskeletal shoulder pain.  Today, she***  ROS: Pertinent ROS in HPI  Studies Reviewed: .        *** Risk Assessment/Calculations:   {Does this patient have ATRIAL FIBRILLATION?:[id number]} No BP recorded.  {Refresh Note OR Click here to enter BP  :1}***       Physical Exam:   VS:  There were no vitals taken for this visit.   Wt Readings from Last 3 Encounters:  09/03/23 157 lb 6.4 oz (71.4 kg)  08/05/23 161 lb 8 oz (73.3 kg)  03/26/23 156 lb (70.8 kg)    GEN: Well nourished, well developed in  no acute distress NECK: No JVD; No carotid bruits CARDIAC: ***RRR, no murmurs, rubs, gallops RESPIRATORY:  Clear to auscultation without rales, wheezing or rhonchi  ABDOMEN: Soft, non-tender, non-distended EXTREMITIES:  No edema; No deformity   ASSESSMENT AND PLAN: .   PAF HTN HLD Vertigo Arteritis Gout PPM    {Are you ordering a CV Procedure (e.g. stress test, cath, DCCV, TEE, etc)?   Press F2        :[id number]}  Dispo: ***  Signed, Sharlene Dory, PA-C

## 2023-10-17 ENCOUNTER — Ambulatory Visit: Payer: Medicare Other | Admitting: Physician Assistant

## 2023-10-17 DIAGNOSIS — I10 Essential (primary) hypertension: Secondary | ICD-10-CM

## 2023-10-17 DIAGNOSIS — M316 Other giant cell arteritis: Secondary | ICD-10-CM

## 2023-10-17 DIAGNOSIS — E785 Hyperlipidemia, unspecified: Secondary | ICD-10-CM

## 2023-10-17 DIAGNOSIS — I480 Paroxysmal atrial fibrillation: Secondary | ICD-10-CM

## 2023-10-17 DIAGNOSIS — Z950 Presence of cardiac pacemaker: Secondary | ICD-10-CM

## 2023-10-17 DIAGNOSIS — I441 Atrioventricular block, second degree: Secondary | ICD-10-CM

## 2023-10-23 NOTE — Progress Notes (Signed)
 Remote pacemaker transmission.

## 2023-10-23 NOTE — Addendum Note (Signed)
 Addended by: Elease Etienne A on: 10/23/2023 09:38 AM   Modules accepted: Orders

## 2023-10-30 ENCOUNTER — Other Ambulatory Visit: Payer: Self-pay | Admitting: Cardiovascular Disease

## 2023-10-30 NOTE — Telephone Encounter (Signed)
 Eliquis 2.5mg  refill request received. Patient is 89 years old, weight-71.4kg, Crea-0.92 on 10/08/23, Diagnosis-Afib, and last seen by Dr. Marven Slimmer on 12/21/22. Dose is inappropriate for 2.5mg  based on dosing criteria.  This eliquis 5mg  prescription was last sent on 12/21/22 for 90 day supply. Pt should be on eliquis 5mg  bid not 2.5mg  calling pharmacy to clarify.  Spoke with pharmacist and he stated they have both and he will remove the 2.5mg  off the list and refill 5mg  as she has a refills left on it. They have not filled the 2.5mg  in awhile. Refused the 2.5mg  refill since inappropriate.

## 2023-11-04 ENCOUNTER — Other Ambulatory Visit

## 2023-11-04 ENCOUNTER — Ambulatory Visit
Admission: RE | Admit: 2023-11-04 | Discharge: 2023-11-04 | Disposition: A | Discharge status: Home / Self Care | Source: Ambulatory Visit | Attending: Internal Medicine | Admitting: Internal Medicine

## 2023-11-04 DIAGNOSIS — R748 Abnormal levels of other serum enzymes: Secondary | ICD-10-CM

## 2023-11-04 DIAGNOSIS — Z1231 Encounter for screening mammogram for malignant neoplasm of breast: Secondary | ICD-10-CM

## 2023-11-06 ENCOUNTER — Encounter: Payer: Self-pay | Admitting: Internal Medicine

## 2023-11-06 ENCOUNTER — Other Ambulatory Visit: Payer: Self-pay | Admitting: Internal Medicine

## 2023-11-06 DIAGNOSIS — N2889 Other specified disorders of kidney and ureter: Secondary | ICD-10-CM | POA: Insufficient documentation

## 2023-11-13 ENCOUNTER — Ambulatory Visit (HOSPITAL_BASED_OUTPATIENT_CLINIC_OR_DEPARTMENT_OTHER)
Admission: RE | Admit: 2023-11-13 | Discharge: 2023-11-13 | Disposition: A | Discharge status: Home / Self Care | Source: Ambulatory Visit | Attending: Internal Medicine | Admitting: Internal Medicine

## 2023-11-13 DIAGNOSIS — N2889 Other specified disorders of kidney and ureter: Secondary | ICD-10-CM | POA: Diagnosis present

## 2023-11-16 ENCOUNTER — Other Ambulatory Visit: Payer: Self-pay | Admitting: Internal Medicine

## 2023-11-20 ENCOUNTER — Other Ambulatory Visit

## 2023-11-20 DIAGNOSIS — R7401 Elevation of levels of liver transaminase levels: Secondary | ICD-10-CM

## 2023-11-21 LAB — COMPLETE METABOLIC PANEL WITHOUT GFR
AG Ratio: 0.7 (calc) — ABNORMAL LOW (ref 1.0–2.5)
ALT: 22 U/L (ref 6–29)
AST: 38 U/L — ABNORMAL HIGH (ref 10–35)
Albumin: 3.1 g/dL — ABNORMAL LOW (ref 3.6–5.1)
Alkaline phosphatase (APISO): 72 U/L (ref 37–153)
BUN/Creatinine Ratio: 21 (calc) (ref 6–22)
BUN: 27 mg/dL — ABNORMAL HIGH (ref 7–25)
CO2: 19 mmol/L — ABNORMAL LOW (ref 20–32)
Calcium: 9.1 mg/dL (ref 8.6–10.4)
Chloride: 105 mmol/L (ref 98–110)
Creat: 1.26 mg/dL — ABNORMAL HIGH (ref 0.60–0.95)
Globulin: 4.2 g/dL — ABNORMAL HIGH (ref 1.9–3.7)
Glucose, Bld: 77 mg/dL (ref 65–99)
Potassium: 4.4 mmol/L (ref 3.5–5.3)
Sodium: 134 mmol/L — ABNORMAL LOW (ref 135–146)
Total Bilirubin: 0.5 mg/dL (ref 0.2–1.2)
Total Protein: 7.3 g/dL (ref 6.1–8.1)

## 2023-11-26 ENCOUNTER — Ambulatory Visit: Payer: Self-pay | Admitting: Internal Medicine

## 2023-12-16 NOTE — Progress Notes (Signed)
  Electrophysiology Office Note:   ID:  Lori, Wilson 1934/10/03, MRN [id number]  Primary Cardiologist: Lori Mediate, MD Electrophysiologist: Lori Byes, MD      History of Present Illness:   Lori Wilson is a 89 y.o. female with h/o symptomatic bradycardia s/p PPM, PAF, HTN, and HLD seen today for routine electrophysiology followup.   Since last being seen in our clinic the patient reports doing OK. Has noted some increased fatigue with mild exertion, but none with ADLs. Otherwise,  she denies chest pain, palpitations, PND, orthopnea, nausea, vomiting, dizziness, syncope, edema, weight gain, or early satiety.   Review of systems complete and found to be negative unless listed in HPI.   EP Information / Studies Reviewed:    EKG is ordered today. Personal review as below.  EKG Interpretation Date/Time:  Tuesday December 17 2023 11:42:23 EDT Ventricular Rate:  56 PR Interval:    QRS Duration:  148 QT Interval:  496 QTC Calculation: 478 R Axis:   96  Text Interpretation: Sinus rhythm with complete heart block and Ventricular-paced rhythm with occasional Premature ventricular complexes Upper rate behavior of a dual chamber PPM programmed DDI Confirmed by Lori Wilson [phone]) on 12/17/2023 12:57:21 PM    PPM Interrogation-  reviewed in detail today,  See PACEART report.  Arrhythmia/Device History Medtronic Dual Chamber PPM implanted 06/26/2019 for second degree AV block   Physical Exam:   VS:  BP 124/76   Pulse (!) 56   Ht 5\' 3"  (1.6 m)   Wt 155 lb (70.3 kg)   SpO2 95%   BMI 27.46 kg/m    Wt Readings from Last 3 Encounters:  12/17/23 155 lb (70.3 kg)  09/03/23 157 lb 6.4 oz (71.4 kg)  08/05/23 161 lb 8 oz (73.3 kg)     GEN: No acute distress  NECK: No JVD; No carotid bruits CARDIAC: Regular rate and rhythm with occasional ectopy, no murmurs, rubs, gallops RESPIRATORY:  Clear to auscultation without rales, wheezing or rhonchi  ABDOMEN: Soft, non-tender,  non-distended EXTREMITIES:  No edema; No deformity   ASSESSMENT AND PLAN:    Symptomatic bradycardia s/p Medtronic PPM  Normal PPM function See Pace Art report No changes today  PAF PAT Burden <1% by device in DDI Continue eliquis  5 mg BID for CHA2DS2VASc of at least 4 Labs 11/20/2023 stable With intermittent CHB and mild SOB in setting of DDI, will change to DDD.  If symptoms worsen or she has tracked AT, would consider low dose BB or even amiodarone prior to changing back to DDI (depending on her symptoms)  Secondary hypercoagulable state Pt on Eliquis  as above  Labs today.   HTN Stable on current regimen   HLD Continue statin    Disposition:   Follow up with EP Team in 12 months  Signed, Lori Galla, PA-C

## 2023-12-17 ENCOUNTER — Encounter: Payer: Self-pay | Admitting: Student

## 2023-12-17 ENCOUNTER — Ambulatory Visit: Payer: Medicare Other | Attending: Student | Admitting: Student

## 2023-12-17 VITALS — BP 124/76 | HR 56 | Ht 63.0 in | Wt 155.0 lb

## 2023-12-17 DIAGNOSIS — I441 Atrioventricular block, second degree: Secondary | ICD-10-CM | POA: Diagnosis present

## 2023-12-17 DIAGNOSIS — I480 Paroxysmal atrial fibrillation: Secondary | ICD-10-CM | POA: Diagnosis not present

## 2023-12-17 DIAGNOSIS — E782 Mixed hyperlipidemia: Secondary | ICD-10-CM | POA: Diagnosis not present

## 2023-12-17 DIAGNOSIS — I10 Essential (primary) hypertension: Secondary | ICD-10-CM | POA: Insufficient documentation

## 2023-12-17 LAB — CUP PACEART INCLINIC DEVICE CHECK
Battery Remaining Longevity: 112 mo
Battery Voltage: 2.99 V
Brady Statistic AP VP Percent: 9.63 %
Brady Statistic AP VS Percent: 0.2 %
Brady Statistic AS VP Percent: 35.78 %
Brady Statistic AS VS Percent: 54.39 %
Brady Statistic RA Percent Paced: 13.68 %
Brady Statistic RV Percent Paced: 45.36 %
Date Time Interrogation Session: 20250603125729
Implantable Lead Connection Status: 753985
Implantable Lead Connection Status: 753985
Implantable Lead Implant Date: 20201211
Implantable Lead Implant Date: 20201211
Implantable Lead Location: 753859
Implantable Lead Location: 753860
Implantable Lead Model: 5076
Implantable Lead Model: 5076
Implantable Pulse Generator Implant Date: 20201211
Lead Channel Impedance Value: 361 Ohm
Lead Channel Impedance Value: 475 Ohm
Lead Channel Impedance Value: 532 Ohm
Lead Channel Impedance Value: 589 Ohm
Lead Channel Pacing Threshold Amplitude: 0.75 V
Lead Channel Pacing Threshold Pulse Width: 0.4 ms
Lead Channel Sensing Intrinsic Amplitude: 3.875 mV
Lead Channel Sensing Intrinsic Amplitude: 4.5 mV
Lead Channel Sensing Intrinsic Amplitude: 5.125 mV
Lead Channel Sensing Intrinsic Amplitude: 5.25 mV
Lead Channel Setting Pacing Amplitude: 2 V
Lead Channel Setting Pacing Amplitude: 2.5 V
Lead Channel Setting Pacing Pulse Width: 0.4 ms
Lead Channel Setting Sensing Sensitivity: 1.2 mV
Zone Setting Status: 755011
Zone Setting Status: 755011

## 2023-12-17 NOTE — Patient Instructions (Signed)
 Medication Instructions:  No medication changes today. *If you need a refill on your cardiac medications before your next appointment, please call your pharmacy*  Lab Work: BMET and CBC today If you have labs (blood work) drawn today and your tests are completely normal, you will receive your results only by: MyChart Message (if you have MyChart) OR A paper copy in the mail If you have any lab test that is abnormal or we need to change your treatment, we will call you to review the results.  Testing/Procedures: No testing ordered today  Follow-Up: At [facility], you and your health needs are our priority.  As part of our continuing mission to provide you with exceptional heart care, our providers are all part of one team.  This team includes your primary Cardiologist (physician) and Advanced Practice Providers or APPs (Physician Assistants and Nurse Practitioners) who all work together to provide you with the care you need, when you need it.  Your next appointment:   12 month(s)  Provider:   You may see Boyce Byes, MD or one of the following Advanced Practice Providers on your designated Care Team:   Mertha Abrahams, [place] Bambi Lever "Jonelle Neri" [place], PA-C Suzann Riddle, NP Creighton Doffing, NP    We recommend signing up for the patient portal called "MyChart".  Sign up information is provided on this After Visit Summary.  MyChart is used to connect with patients for Virtual Visits (Telemedicine).  Patients are able to view lab/test results, encounter notes, upcoming appointments, etc.  Non-urgent messages can be sent to your provider as well.   To learn more about what you can do with MyChart, go to [email or web address].

## 2023-12-20 ENCOUNTER — Ambulatory Visit (INDEPENDENT_AMBULATORY_CARE_PROVIDER_SITE_OTHER): Payer: Medicare Other

## 2023-12-20 DIAGNOSIS — I441 Atrioventricular block, second degree: Secondary | ICD-10-CM

## 2023-12-21 ENCOUNTER — Ambulatory Visit: Payer: Self-pay | Admitting: Cardiology

## 2023-12-21 LAB — CUP PACEART REMOTE DEVICE CHECK
Battery Remaining Longevity: 110 mo
Battery Voltage: 2.99 V
Brady Statistic AP VP Percent: 1.07 %
Brady Statistic AP VS Percent: 0.06 %
Brady Statistic AS VP Percent: 94.05 %
Brady Statistic AS VS Percent: 4.81 %
Brady Statistic RA Percent Paced: 1.12 %
Brady Statistic RV Percent Paced: 95.12 %
Date Time Interrogation Session: 20250606011506
Implantable Lead Connection Status: 753985
Implantable Lead Connection Status: 753985
Implantable Lead Implant Date: 20201211
Implantable Lead Implant Date: 20201211
Implantable Lead Location: 753859
Implantable Lead Location: 753860
Implantable Lead Model: 5076
Implantable Lead Model: 5076
Implantable Pulse Generator Implant Date: 20201211
Lead Channel Impedance Value: 323 Ohm
Lead Channel Impedance Value: 380 Ohm
Lead Channel Impedance Value: 437 Ohm
Lead Channel Impedance Value: 551 Ohm
Lead Channel Pacing Threshold Amplitude: 0.5 V
Lead Channel Pacing Threshold Amplitude: 0.75 V
Lead Channel Pacing Threshold Pulse Width: 0.4 ms
Lead Channel Pacing Threshold Pulse Width: 0.4 ms
Lead Channel Sensing Intrinsic Amplitude: 4 mV
Lead Channel Sensing Intrinsic Amplitude: 4 mV
Lead Channel Sensing Intrinsic Amplitude: 5.75 mV
Lead Channel Sensing Intrinsic Amplitude: 5.75 mV
Lead Channel Setting Pacing Amplitude: 1.75 V
Lead Channel Setting Pacing Amplitude: 2.5 V
Lead Channel Setting Pacing Pulse Width: 0.4 ms
Lead Channel Setting Sensing Sensitivity: 1.2 mV
Zone Setting Status: 755011
Zone Setting Status: 755011

## 2024-01-15 NOTE — Progress Notes (Signed)
 Patient ID: KHRISTINE VERNO MRN: [id number]; DOB: 1935/02/19  Admit date: (Not on file) Date of Consult: 01/22/2024  Primary Care Provider: Theophilus Andrews, Tully GRADE, MD Primary Cardiologist: Delford Primary Electrophysiologist:  Cindie    History of Present Illness:   Ms. Acero 89 y.o. no history of cardiac disease. First seen 11/07/18  CRF;s HTN and HLD history of gout , vertigo and pneumonia. Pevious giant cell arteritis now off prednisone Seen by primary 09/23/27 with 2 week complaint of palpitations. Heart beating in her neck Occurs daily Gets fatigue and needs to rest No associated syncope dyspnea or chest pain Pulse was 72 in office with NSR on ECG Echo done 08/12/16 for dizziness was normal with EF 60-65% just mild AR and aortic root 4.2 cm Carotid done for same reason plaque no stenosis in ICA;s   She is widowed over 40 years Raised 4 kids with youngest son living with her   Monitor reviewed 12/26/18 with rare PAF rates 150 but frequent episodes of 2:1 AV block Had Medtronic dual chamber PPM placed 06/26/19   PACEART has shown normal function  and low burden PAF On low dose eliquis  due to age and renal function   Labs 11/20/23 Cr 1.26 K 4.4   Youngest son died of cancer 10/17/2022  Has twins in town and another child in [place] Some left musculoskeletal shoulder pain has had elevated sed rate in 64 range   Has some pain ? Swelling in left shoulder and PPM ? Arthritis as she has pain with movement and back of shoulder    Past Medical History:  Diagnosis Date   Dizziness 08/12/2016   Giant cell arteritis (HCC) 08/13/2016   High cholesterol    Hypertension    Pneumonia    Second degree Mobitz II AV block    Tremor 02/24/2014   Vertigo 08/12/2016    Past Surgical History:  Procedure Laterality Date   BLADDER SURGERY     catract Left 04/06/2014   PACEMAKER IMPLANT N/A 06/26/2019   Procedure: PACEMAKER IMPLANT;  Surgeon: Kelsie Agent, MD;  Location: [facility];  Service:  Cardiovascular;  Laterality: N/A;   VAGINAL HYSTERECTOMY       Home Medications:  Prior to Admission medications   Medication Sig Start Date End Date Taking? Authorizing Provider  acetaminophen  (TYLENOL ) 325 MG tablet Take 650 mg by mouth every 6 (six) hours as needed for mild pain.    [provider]  allopurinol  (ZYLOPRIM ) 100 MG tablet Take 1 tablet (100 mg total) by mouth daily. 07/02/18   Theophilus Andrews, Tully GRADE, MD  amLODipine  (NORVASC ) 5 MG tablet Take 1 tablet (5 mg total) by mouth daily. 07/02/18   Theophilus Andrews Tully GRADE, MD  aspirin  81 MG tablet Take 81 mg by mouth daily.    [provider]  atorvastatin  (LIPITOR) 10 MG tablet Take 1 tablet (10 mg total) by mouth daily. Patient not taking: Reported on 09/23/2018 07/02/18   Theophilus Andrews, Tully GRADE, MD  dorzolamide  (TRUSOPT ) 2 % ophthalmic solution Place 1 drop into the left eye 2 (two) times daily.     [provider]  furosemide  (LASIX ) 20 MG tablet Take 0.5 tablets (10 mg total) by mouth daily. 07/02/18   Theophilus Andrews, Tully GRADE, MD  hydroxypropyl methylcellulose / hypromellose (ISOPTO TEARS / GONIOVISC) 2.5 % ophthalmic solution Place 1 drop into both eyes 3 (three) times daily as needed for dry eyes.    [provider]  meclizine  (ANTIVERT )  12.5 MG tablet Take 1 tablet (12.5 mg total) by mouth 2 (two) times daily as needed for dizziness. 07/02/18   Theophilus Andrews, Tully GRADE, MD  potassium chloride  (K-DUR) 10 MEQ tablet TAKE 1 TABLET(10 MEQ) BY MOUTH DAILY 08/14/18   Theophilus Andrews, Tully GRADE, MD    Inpatient Medications: Scheduled Meds:  Continuous Infusions:  PRN Meds:   Allergies:   No Known Allergies  Social History:   Social History   Socioeconomic History   Marital status: Widowed    Spouse name: Not on file   Number of children: 4   Years of education: 12   Highest education level: Not on file  Occupational History    Employer: RETIRED    Comment: Retired   Tobacco Use   Smoking status: Never   Smokeless tobacco: Never  Vaping Use   Vaping status: Never Used  Substance and Sexual Activity   Alcohol use: No    Alcohol/week: 0.0 standard drinks of alcohol   Drug use: No   Sexual activity: Not on file  Other Topics Concern   Not on file  Social History Narrative   Patient is widowed and her grandson stay with her.    Retired.   Education high school.   Right handed.   Caffeine coffee one cup daily.   Social Drivers of Corporate investment banker Strain: Low Risk  (08/05/2023)   Overall Financial Resource Strain (CARDIA)    Difficulty of Paying Living Expenses: Not hard at all  Food Insecurity: No Food Insecurity (08/05/2023)   Hunger Vital Sign    Worried About Running Out of Food in the Last Year: Never true    Ran Out of Food in the Last Year: Never true  Transportation Needs: No Transportation Needs (08/05/2023)   PRAPARE - Administrator, Civil Service (Medical): No    Lack of Transportation (Non-Medical): No  Physical Activity: Insufficiently Active (08/05/2023)   Exercise Vital Sign    Days of Exercise per Week: 7 days    Minutes of Exercise per Session: 20 min  Stress: No Stress Concern Present (08/05/2023)   [employer] of Occupational Health - Occupational Stress Questionnaire    Feeling of Stress : Not at all  Social Connections: Moderately Integrated (08/05/2023)   Social Connection and Isolation Panel    Frequency of Communication with Friends and Family: More than three times a week    Frequency of Social Gatherings with Friends and Family: More than three times a week    Attends Religious Services: More than 4 times per year    Active Member of [employer] or Organizations: Yes    Attends Banker Meetings: More than 4 times per year    Marital Status: Widowed  Intimate Partner Violence: Not At Risk (08/05/2023)   Humiliation, Afraid, Rape, and Kick questionnaire    Fear of Current or  Ex-Partner: No    Emotionally Abused: No    Physically Abused: No    Sexually Abused: No    Family History:    Family History  Problem Relation Age of Onset   Heart attack Mother    Diabetes Mother    Stroke Father    High blood pressure Father      ROS:  Please see the history of present illness.   All other ROS reviewed and negative.     Physical Exam/Data:   Vitals:   01/22/24 0903  BP: 116/78  Pulse: 69  SpO2: 97%  Weight: 154 lb 3.2 oz (69.9 kg)  Height: 5' 3 (1.6 m)    @IOBRIEF @    01/22/2024    9:03 AM 12/17/2023   11:31 AM 09/03/2023    3:44 PM  Last 3 Weights  Weight (lbs) 154 lb 3.2 oz 155 lb 157 lb 6.4 oz  Weight (kg) 69.945 kg 70.308 kg 71.396 kg     Body mass index is 27.32 kg/m.   Affect appropriate Healthy:  appears stated age HEENT: normal Neck supple with no adenopathy JVP normal no bruits no thyromegaly Lungs clear with no wheezing and good diaphragmatic motion Heart:  S1/S2 no murmur, no rub, gallop or click PMI normal pacer under left clavicle  Abdomen: benighn, BS positve, no tenderness, no AAA no bruit.  No HSM or HJR Distal pulses intact with no bruits No edema Neuro non-focal Skin warm and dry No muscular weakness   ECG:  11/07/18 SR rate 65 PR 236 low voltage no acute ST changes 01/22/2024 afib rate 72 low voltage non specific ST changes   Relevant CV Studies: Echo 08/12/16 Carotid 08/13/16 [place] 12/26/18  PACEART 12/21/23   Radiology/Studies:  No results found.  Assessment and Plan:   PAF:  Continue eliquis  low burden stable Check CBC/BMET On low dose eliquis  due to age and renal function  HTN: Well controlled.  Continue current medications and low sodium Dash type diet.   HLD on statin labs with primary  Vertigo has antivert  had normal MRI of head 08/12/16 Arteritis: f/u rheumatology off prednisone  Gout continue allopurinol  quiescent  7.  PPM:  medtronic 06/26/19 implant f/u Cindie normal function by        [facility]   12/21/23 PAT burden < 1% by device in DDI changed to DDD will get LUE venous duplex to r/o thrombosis of the vein due to lead obstruction but doubt it  LUE venous duplex  F/U with DR Cindie 6 month  and me in a year    For questions or updates, please contact [facility] Please consult [email or web address] for contact info under     Signed, Maude Emmer, MD  01/22/2024 9:13 AM

## 2024-01-22 ENCOUNTER — Ambulatory Visit: Attending: Cardiovascular Disease | Admitting: Cardiovascular Disease

## 2024-01-22 ENCOUNTER — Encounter: Payer: Self-pay | Admitting: Cardiovascular Disease

## 2024-01-22 VITALS — BP 116/78 | HR 69 | Ht 63.0 in | Wt 154.2 lb

## 2024-01-22 DIAGNOSIS — M79622 Pain in left upper arm: Secondary | ICD-10-CM | POA: Diagnosis present

## 2024-01-22 DIAGNOSIS — Z950 Presence of cardiac pacemaker: Secondary | ICD-10-CM | POA: Diagnosis present

## 2024-01-22 DIAGNOSIS — I480 Paroxysmal atrial fibrillation: Secondary | ICD-10-CM | POA: Diagnosis present

## 2024-01-22 DIAGNOSIS — E782 Mixed hyperlipidemia: Secondary | ICD-10-CM | POA: Diagnosis present

## 2024-01-22 DIAGNOSIS — I82A19 Acute embolism and thrombosis of unspecified axillary vein: Secondary | ICD-10-CM | POA: Diagnosis present

## 2024-01-22 NOTE — Patient Instructions (Addendum)
 Medication Instructions:  Your physician recommends that you continue on your current medications as directed. Please refer to the Current Medication list given to you today.  *If you need a refill on your cardiac medications before your next appointment, please call your pharmacy*  Lab Work: If you have labs (blood work) drawn today and your tests are completely normal, you will receive your results only by: MyChart Message (if you have MyChart) OR A paper copy in the mail If you have any lab test that is abnormal or we need to change your treatment, we will call you to review the results.  Testing/Procedures: Your physician has requested that you have an upper extremity venous duplex as soon as possible. This test is an ultrasound of the veins in the arms. It looks at venous blood flow that carries blood from the heart to the arms. Allow thirty minutes for an Upper Venous exam. There are no restrictions or special instructions.  Please note: We ask at that you not bring children with you during ultrasound (echo/ vascular) testing. Due to room size and safety concerns, children are not allowed in the ultrasound rooms during exams. Our front office staff cannot provide observation of children in our lobby area while testing is being conducted. An adult accompanying a patient to their appointment will only be allowed in the ultrasound room at the discretion of the ultrasound technician under special circumstances. We apologize for any inconvenience.   Follow-Up: At [facility], you and your health needs are our priority.  As part of our continuing mission to provide you with exceptional heart care, our providers are all part of one team.  This team includes your primary Cardiologist (physician) and Advanced Practice Providers or APPs (Physician Assistants and Nurse Practitioners) who all work together to provide you with the care you need, when you need it.  Your next appointment:   1  year(s)  Provider:   Maude Emmer, MD    We recommend signing up for the patient portal called MyChart.  Sign up information is provided on this After Visit Summary.  MyChart is used to connect with patients for Virtual Visits (Telemedicine).  Patients are able to view lab/test results, encounter notes, upcoming appointments, etc.  Non-urgent messages can be sent to your provider as well.   To learn more about what you can do with MyChart, go to [email or web address].

## 2024-02-03 ENCOUNTER — Ambulatory Visit (HOSPITAL_COMMUNITY): Attending: Cardiovascular Disease

## 2024-02-04 ENCOUNTER — Other Ambulatory Visit: Payer: Self-pay | Admitting: Internal Medicine

## 2024-02-04 ENCOUNTER — Ambulatory Visit: Payer: Self-pay | Admitting: Cardiovascular Disease

## 2024-02-04 ENCOUNTER — Other Ambulatory Visit: Payer: Self-pay | Admitting: Cardiology

## 2024-02-04 ENCOUNTER — Ambulatory Visit (HOSPITAL_COMMUNITY)
Admission: RE | Admit: 2024-02-04 | Discharge: 2024-02-04 | Disposition: A | Discharge status: Home / Self Care | Source: Ambulatory Visit | Attending: Cardiovascular Disease | Admitting: Cardiovascular Disease

## 2024-02-04 DIAGNOSIS — I480 Paroxysmal atrial fibrillation: Secondary | ICD-10-CM

## 2024-02-04 DIAGNOSIS — I82A19 Acute embolism and thrombosis of unspecified axillary vein: Secondary | ICD-10-CM | POA: Insufficient documentation

## 2024-02-04 DIAGNOSIS — Z950 Presence of cardiac pacemaker: Secondary | ICD-10-CM

## 2024-02-04 DIAGNOSIS — I441 Atrioventricular block, second degree: Secondary | ICD-10-CM

## 2024-02-04 DIAGNOSIS — M79622 Pain in left upper arm: Secondary | ICD-10-CM | POA: Diagnosis not present

## 2024-02-04 NOTE — Telephone Encounter (Signed)
 Copied from CRM [id number]. Topic: Clinical - Medication Refill >> Feb 04, 2024 10:03 AM Suzen RAMAN wrote: Medication: apixaban  (ELIQUIS ) 5 MG TABS tablet  Has the patient contacted their pharmacy? Yes  This is the patient's preferred pharmacy:  [facility] [id number] - [place], [place] - [address] AT [facility] [address] [place] [address] Phone: [phone] Fax: [phone]  Is this the correct pharmacy for this prescription? Yes If no, delete pharmacy and type the correct one.   Has the prescription been filled recently? No  Is the patient out of the medication? No  Has the patient been seen for an appointment in the last year OR does the patient have an upcoming appointment? Yes  Can we respond through MyChart? No  Agent: Please be advised that Rx refills may take up to 3 business days. We ask that you follow-up with your pharmacy.

## 2024-02-04 NOTE — Addendum Note (Signed)
 Addended by: VICCI SELLER A on: 02/04/2024 10:10 AM   Modules accepted: Orders

## 2024-02-04 NOTE — Telephone Encounter (Signed)
 Prescription refill request for Eliquis  received. Indication: PAF Last office visit: 01/22/24  SHAUNNA Emmer MD Scr: 1.26 on 11/20/23  Epic Age: 89 Weight: 69.9kg  Based on above findings Eliquis  5mg  twice daily is the appropriate dose.  Refill approved.

## 2024-02-04 NOTE — Telephone Encounter (Signed)
 Last OV 09/03/23 with E. Theophilus Andrews, MD

## 2024-02-04 NOTE — Progress Notes (Signed)
 Remote pacemaker transmission.

## 2024-02-14 ENCOUNTER — Other Ambulatory Visit: Payer: Self-pay | Admitting: Internal Medicine

## 2024-02-14 DIAGNOSIS — I10 Essential (primary) hypertension: Secondary | ICD-10-CM

## 2024-03-20 ENCOUNTER — Ambulatory Visit (INDEPENDENT_AMBULATORY_CARE_PROVIDER_SITE_OTHER): Payer: Medicare Other

## 2024-03-20 DIAGNOSIS — I441 Atrioventricular block, second degree: Secondary | ICD-10-CM

## 2024-03-21 ENCOUNTER — Ambulatory Visit: Payer: Self-pay | Admitting: Cardiology

## 2024-03-21 LAB — CUP PACEART REMOTE DEVICE CHECK
Battery Remaining Longevity: 103 mo
Battery Voltage: 2.98 V
Brady Statistic AP VP Percent: 4.29 %
Brady Statistic AP VS Percent: 0.11 %
Brady Statistic AS VP Percent: 93.3 %
Brady Statistic AS VS Percent: 2.31 %
Brady Statistic RA Percent Paced: 4.33 %
Brady Statistic RV Percent Paced: 97.45 %
Date Time Interrogation Session: 20250905065444
Implantable Lead Connection Status: 753985
Implantable Lead Connection Status: 753985
Implantable Lead Implant Date: 20201211
Implantable Lead Implant Date: 20201211
Implantable Lead Location: 753859
Implantable Lead Location: 753860
Implantable Lead Model: 5076
Implantable Lead Model: 5076
Implantable Pulse Generator Implant Date: 20201211
Lead Channel Impedance Value: 342 Ohm
Lead Channel Impedance Value: 418 Ohm
Lead Channel Impedance Value: 418 Ohm
Lead Channel Impedance Value: 475 Ohm
Lead Channel Pacing Threshold Amplitude: 0.375 V
Lead Channel Pacing Threshold Amplitude: 0.75 V
Lead Channel Pacing Threshold Pulse Width: 0.4 ms
Lead Channel Pacing Threshold Pulse Width: 0.4 ms
Lead Channel Sensing Intrinsic Amplitude: 3.875 mV
Lead Channel Sensing Intrinsic Amplitude: 3.875 mV
Lead Channel Sensing Intrinsic Amplitude: 7.625 mV
Lead Channel Sensing Intrinsic Amplitude: 7.625 mV
Lead Channel Setting Pacing Amplitude: 1.5 V
Lead Channel Setting Pacing Amplitude: 2.5 V
Lead Channel Setting Pacing Pulse Width: 0.4 ms
Lead Channel Setting Sensing Sensitivity: 1.2 mV
Zone Setting Status: 755011
Zone Setting Status: 755011

## 2024-04-02 NOTE — Progress Notes (Signed)
 Remote PPM Transmission

## 2024-05-15 ENCOUNTER — Other Ambulatory Visit: Payer: Self-pay | Admitting: Internal Medicine

## 2024-06-09 ENCOUNTER — Ambulatory Visit (INDEPENDENT_AMBULATORY_CARE_PROVIDER_SITE_OTHER): Admitting: Internal Medicine

## 2024-06-09 ENCOUNTER — Encounter: Payer: Self-pay | Admitting: Internal Medicine

## 2024-06-09 VITALS — BP 122/84 | HR 64 | Temp 97.4°F | Wt 152.8 lb

## 2024-06-09 DIAGNOSIS — R109 Unspecified abdominal pain: Secondary | ICD-10-CM

## 2024-06-09 MED ORDER — MELOXICAM 7.5 MG PO TABS: 7.5000 mg | ORAL_TABLET | Freq: Every day | ORAL | 0 refills | Status: AC

## 2024-06-09 NOTE — Progress Notes (Signed)
 Established Patient Office Visit     CC/Reason for Visit: Right sided upper abdominal wall pain  HPI: Lori Wilson is a 89 y.o. female who is coming in today for the above mentioned reasons.  For couple weeks now she has been having pain of her right upper quadrant abdominal wall right under her bra line.  It is painful to touch.  It is aggravated by movements such as vacuuming or sweeping around the house or reaching high for kitchen cabinets.  She cannot recall any particular incident that aggravated it.  No nausea or vomiting, no true abdominal pain.   Past Medical/Surgical History: Past Medical History:  Diagnosis Date   Dizziness 08/12/2016   Giant cell arteritis (HCC) 08/13/2016   High cholesterol    Hypertension    Pneumonia    Second degree Mobitz II AV block    Tremor 02/24/2014   Vertigo 08/12/2016    Past Surgical History:  Procedure Laterality Date   BLADDER SURGERY     catract Left 04/06/2014   PACEMAKER IMPLANT N/A 06/26/2019   Procedure: PACEMAKER IMPLANT;  Surgeon: Kelsie Agent, MD;  Location: [facility];  Service: Cardiovascular;  Laterality: N/A;   VAGINAL HYSTERECTOMY      Social History:  reports that she has never smoked. She has never used smokeless tobacco. She reports that she does not drink alcohol and does not use drugs.  Allergies: No Known Allergies  Family History:  Family History  Problem Relation Age of Onset   Heart attack Mother    Diabetes Mother    Stroke Father    High blood pressure Father      Current Outpatient Medications:    acetaminophen  (TYLENOL ) 325 MG tablet, Take 650 mg by mouth every 6 (six) hours as needed., Disp: , Rfl:    amLODipine  (NORVASC ) 5 MG tablet, TAKE 1 TABLET(5 MG) BY MOUTH DAILY, Disp: 90 tablet, Rfl: 1   dorzolamide -timolol (COSOPT) 2-0.5 % ophthalmic solution, Place 1 drop into the left eye 2 (two) times daily., Disp: , Rfl:    ELIQUIS  5 MG TABS tablet, TAKE 1 TABLET(5 MG) BY MOUTH TWICE  DAILY, Disp: 180 tablet, Rfl: 1   furosemide  (LASIX ) 20 MG tablet, TAKE 1/2 TABLET(10 MG) BY MOUTH DAILY, Disp: 45 tablet, Rfl: 1   meclizine  (ANTIVERT ) 12.5 MG tablet, Take 12.5 mg by mouth 3 (three) times daily as needed for dizziness., Disp: , Rfl:    meloxicam  (MOBIC ) 7.5 MG tablet, Take 1 tablet (7.5 mg total) by mouth daily., Disp: 10 tablet, Rfl: 0   omeprazole  (PRILOSEC) 20 MG capsule, Take 1 capsule (20 mg total) by mouth every other day., Disp: 90 capsule, Rfl: 1   potassium chloride  (KLOR-CON ) 10 MEQ tablet, TAKE 1 TABLET(10 MEQ) BY MOUTH DAILY, Disp: 90 tablet, Rfl: 0   triamcinolone  (KENALOG ) 0.1 % paste, as needed., Disp: , Rfl:    allopurinol  (ZYLOPRIM ) 100 MG tablet, TAKE 1 TABLET(100 MG) BY MOUTH DAILY (Patient not taking: Reported on 06/09/2024), Disp: 90 tablet, Rfl: 1   atorvastatin  (LIPITOR) 40 MG tablet, Take 1 tablet (40 mg total) by mouth daily. (Patient not taking: Reported on 06/09/2024), Disp: 90 tablet, Rfl: 3   cetirizine  (ZYRTEC ) 10 MG tablet, Take 1 tablet (10 mg total) by mouth daily. (Patient not taking: Reported on 06/09/2024), Disp: 30 tablet, Rfl: 11  Review of Systems:  Negative unless indicated in HPI.   Physical Exam: Vitals:   06/09/24 1101  BP: 122/84  Pulse: 64  Temp: (!) 97.4 F (36.3 C)  TempSrc: Oral  SpO2: 95%  Weight: 152 lb 12.8 oz (69.3 kg)    Body mass index is 27.07 kg/m.   Physical Exam Abdominal:      Comments: Pain to palpation of upper right thoracic rib cage.      Impression and Plan:  Abdominal wall pain -     Meloxicam ; Take 1 tablet (7.5 mg total) by mouth daily.  Dispense: 10 tablet; Refill: 0   - Suspect this to be muscular in origin.  Have advised heating pad and meloxicam  for 10 days.  Time spent:22 minutes reviewing chart, interviewing and examining patient and formulating plan of care.     Tully Theophilus Andrews, MD [facility]

## 2024-06-19 ENCOUNTER — Ambulatory Visit: Payer: Medicare Other

## 2024-06-19 DIAGNOSIS — I441 Atrioventricular block, second degree: Secondary | ICD-10-CM

## 2024-06-22 LAB — CUP PACEART REMOTE DEVICE CHECK
Battery Remaining Longevity: 98 mo
Battery Voltage: 2.98 V
Brady Statistic AP VP Percent: 3.81 %
Brady Statistic AP VS Percent: 0.04 %
Brady Statistic AS VP Percent: 95.3 %
Brady Statistic AS VS Percent: 0.84 %
Brady Statistic RA Percent Paced: 3.79 %
Brady Statistic RV Percent Paced: 99 %
Date Time Interrogation Session: 20251204212316
Implantable Lead Connection Status: 753985
Implantable Lead Connection Status: 753985
Implantable Lead Implant Date: 20201211
Implantable Lead Implant Date: 20201211
Implantable Lead Location: 753859
Implantable Lead Location: 753860
Implantable Lead Model: 5076
Implantable Lead Model: 5076
Implantable Pulse Generator Implant Date: 20201211
Lead Channel Impedance Value: 361 Ohm
Lead Channel Impedance Value: 380 Ohm
Lead Channel Impedance Value: 437 Ohm
Lead Channel Impedance Value: 475 Ohm
Lead Channel Pacing Threshold Amplitude: 0.375 V
Lead Channel Pacing Threshold Amplitude: 0.625 V
Lead Channel Pacing Threshold Pulse Width: 0.4 ms
Lead Channel Pacing Threshold Pulse Width: 0.4 ms
Lead Channel Sensing Intrinsic Amplitude: 4 mV
Lead Channel Sensing Intrinsic Amplitude: 4 mV
Lead Channel Sensing Intrinsic Amplitude: 6 mV
Lead Channel Sensing Intrinsic Amplitude: 6 mV
Lead Channel Setting Pacing Amplitude: 1.5 V
Lead Channel Setting Pacing Amplitude: 2.5 V
Lead Channel Setting Pacing Pulse Width: 0.4 ms
Lead Channel Setting Sensing Sensitivity: 1.2 mV
Zone Setting Status: 755011
Zone Setting Status: 755011

## 2024-06-23 NOTE — Progress Notes (Signed)
 Remote PPM Transmission

## 2024-06-24 ENCOUNTER — Ambulatory Visit: Payer: Self-pay | Admitting: Cardiology

## 2024-07-28 ENCOUNTER — Other Ambulatory Visit: Payer: Self-pay | Admitting: Cardiovascular Disease

## 2024-07-28 DIAGNOSIS — Z950 Presence of cardiac pacemaker: Secondary | ICD-10-CM

## 2024-07-28 DIAGNOSIS — I441 Atrioventricular block, second degree: Secondary | ICD-10-CM

## 2024-07-28 DIAGNOSIS — I480 Paroxysmal atrial fibrillation: Secondary | ICD-10-CM

## 2024-07-28 NOTE — Telephone Encounter (Signed)
 Pt last saw Dr Delford 01/22/24, last labs 11/20/23 Creat 1.26, age 89, weight 69.3kg, based on specified criteria pt is on appropriate dosage of Eliquis  5mg  BID for afib.  Will refill rx.

## 2024-08-06 ENCOUNTER — Encounter: Payer: Self-pay | Admitting: Family Medicine

## 2024-08-06 ENCOUNTER — Ambulatory Visit: Admitting: Family Medicine

## 2024-08-06 DIAGNOSIS — Z0000 Encounter for general adult medical examination without abnormal findings: Secondary | ICD-10-CM | POA: Diagnosis not present

## 2024-08-06 NOTE — Patient Instructions (Signed)
 I really enjoyed getting to talk with you today! I am available on Tuesdays and Thursdays for virtual visits if you have any questions or concerns, or if I can be of any further assistance.   CHECKLIST FROM ANNUAL WELLNESS VISIT:  -Follow up (please call to schedule if not scheduled after visit):   -yearly for annual wellness visit with primary care office  Here is a list of your preventive care/health maintenance measures and the plan for each if any are due:  PLAN For any measures below that may be due:    1. Please bring a copy of you vaccine records for Dr. Theophilus.   Health Maintenance  Topic Date Due   DTaP/Tdap/Td (1 - Tdap) Never done   Pneumococcal Vaccine: 50+ Years (2 of 2 - PPSV23, PCV20, or PCV21) 06/22/2019   Medicare Annual Wellness (AWV)  08/04/2024   COVID-19 Vaccine (8 - 2025-26 season) 11/17/2024   Influenza Vaccine  Completed   Bone Density Scan  Completed   Zoster Vaccines- Shingrix  Completed   Meningococcal B Vaccine  Aged Out    -See a dentist at least yearly  -Get your eyes checked and then per your eye specialist's recommendations  -Other issues addressed today:   -I have included below further information regarding a healthy whole foods based diet, physical activity guidelines for adults, stress management and opportunities for social connections. I hope you find this information useful.   -----------------------------------------------------------------------------------------------------------------------------------------------------------------------------------------------------------------------------------------------------------    NUTRITION: -eat real food: lots of colorful vegetables (half the plate) and fruits -5-7 servings of vegetables and fruits per day (fresh or steamed is best), exp. 2 servings of vegetables with lunch and dinner and 2 servings of fruit per day. Berries and greens such as kale and collards are great choices.   -consume on a regular basis:  fresh fruits, fresh veggies, fish, nuts, seeds, healthy oils (such as olive oil, avocado oil), whole grains (make sure for bread/pasta/crackers/etc., that the first ingredient on label contains the word whole), legumes. -can eat small amounts of dairy and lean meat (no larger than the palm of your hand), but avoid processed meats such as ham, bacon, lunch meat, etc. -drink water -try to avoid fast food and pre-packaged foods, processed meat, ultra processed foods/beverages (donuts, candy, etc.) -most experts advise limiting sodium to < 2300mg  per day, should limit further is any chronic conditions such as high blood pressure, heart disease, diabetes, etc. The American Heart Association advised that < 1500mg  is is ideal -try to avoid foods/beverages that contain any ingredients with names you do not recognize  -try to avoid foods/beverages  with added sugar or sweeteners/sweets  -try to avoid sweet drinks (including diet drinks): soda, juice, Gatorade, sweet tea, power drinks, diet drinks -try to avoid white rice, white bread, pasta (unless whole grain)  EXERCISE GUIDELINES FOR ADULTS: -if you wish to increase your physical activity, do so gradually and with the approval of your doctor -STOP and seek medical care immediately if you have any chest pain, chest discomfort or trouble breathing when starting or increasing exercise  -move and stretch your body, legs, feet and arms when sitting for long periods -Physical activity guidelines for optimal health in adults: -get at least 150 minutes per week of moderate exercise (can talk, but not sing); this is about 20-30 minutes of sustained activity 5-7 days per week or two 10-15 minute episodes of sustained activity 5-7 days per week -do some muscle building/resistance training/strength training at least 2 days per  week  -balance exercises 3+ days per week:   Stand somewhere where you have something sturdy to hold onto if  you lose balance    1) lift up on toes, then back down, start with 5x per day and work up to 20x   2) stand and lift one leg straight out to the side so that foot is a few inches of the floor, start with 5x each side and work up to 20x each side   3) stand on one foot, start with 5 seconds each side and work up to 20 seconds on each side  If you need ideas or help with getting more active:  -Silver sneakers https://tools.silversneakers.com  -Walk with a Doc: Http://www.duncan-williams.com/  -try to include resistance (weight lifting/strength building) and balance exercises twice per week: or the following link for ideas: http://castillo-powell.com/  buyducts.dk  STRESS MANAGEMENT: -can try meditating, or just sitting quietly with deep breathing while intentionally relaxing all parts of your body for 5 minutes daily -if you need further help with stress, anxiety or depression please follow up with your primary doctor or contact the wonderful folks at [facility]: [phone]  SOCIAL CONNECTIONS: -options in [place] if you wish to engage in more social and exercise related activities:  -Silver sneakers https://tools.silversneakers.com  -Walk with a Doc: Http://www.duncan-williams.com/  -Check out the [facility] Active Adults 50+ section on the [place] of [employer] (hiking clubs, book clubs, cards and games, chess, exercise classes, aquatic classes and much more) - see the website for details: https://www.[place]-.gov/departments/parks-recreation/active-adults50  -YouTube has lots of exercise videos for different ages and abilities as well  -[facility] (a variety of indoor and outdoor inperson activities for adults). [phone]. [address]  -Virtual Online Classes (a variety of topics): see seniorplanet.org or call [phone]  -consider  volunteering at a school, hospice center, church, senior center or elsewhere

## 2024-08-06 NOTE — Progress Notes (Signed)
 " ----------------------------------------------------------------------------------------------------------------------------------------------------------------------------------------------------------------------  Because this visit was a virtual/telehealth visit, some criteria may be missing or patient reported. Any vitals not documented were not able to be obtained and vitals that have been documented are patient reported.    MEDICARE ANNUAL PREVENTIVE VISIT WITH PROVIDER: (Welcome to [facility], initial annual wellness or annual wellness exam)  Virtual Visit via Phone Note  I connected with Lori Wilson on 08/06/24 by phone and verified that I am speaking with the correct person using two identifiers.  Location patient: home Location provider:work or home office Persons participating in the virtual visit: patient, provider  Concerns and/or follow up today: detailed intake and risks/health assessment completed in flowsheets and below - please see for details.   How often do you have a drink containing alcohol?never How many drinks containing alcohol do you have on a typical day when you are drinking?na How often do you have six or more drinks on one occasion?na Have you ever smoked?n Quit date if applicable? na  How many packs a day do/did you smoke? na Do you use smokeless tobacco?n Do you use an illicit drugs?n Do you feel safe at home?y, has two neighbors and family checking in on her daily Last dentist visit?goes to dentures Last eye Exam and location?goes once a year - recently and she can't recall the name   See HM section in Epic for other details of completed HM.    ROS: negative for report of fevers, unintentional weight loss, vision changes, vision loss, hearing loss or change, chest pain, sob, hemoptysis, melena, hematochezia, hematuria or bleeding or bruising  Patient-completed extensive health risk assessment - reviewed and discussed with the patient: See Health  Risk Assessment completed with patient prior to the visit either above or in recent phone note. This was reviewed in detailed with the patient today and appropriate recommendations, orders and referrals were placed as needed per Summary below and patient instructions.   Review of Medical History: -PMH, PSH, Family History and current specialty and care providers reviewed and updated and listed below   Patient Care Team: Theophilus Andrews, Tully GRADE, MD as PCP - General (Internal Medicine) Delford Maude BROCKS, MD as PCP - Cardiology (Cardiology) Cindie Ole DASEN, MD (Inactive) as PCP - Electrophysiology (Cardiology)   Past Medical History:  Diagnosis Date   Dizziness 08/12/2016   Giant cell arteritis (HCC) 08/13/2016   High cholesterol    Hypertension    Pneumonia    Second degree Mobitz II AV block    Tremor 02/24/2014   Vertigo 08/12/2016    Past Surgical History:  Procedure Laterality Date   BLADDER SURGERY     catract Left 04/06/2014   PACEMAKER IMPLANT N/A 06/26/2019   Procedure: PACEMAKER IMPLANT;  Surgeon: Kelsie Agent, MD;  Location: [facility];  Service: Cardiovascular;  Laterality: N/A;   VAGINAL HYSTERECTOMY      Social History   Socioeconomic History   Marital status: Widowed    Spouse name: Not on file   Number of children: 4   Years of education: 12   Highest education level: Not on file  Occupational History    Employer: RETIRED    Comment: Retired  Tobacco Use   Smoking status: Never   Smokeless tobacco: Never  Vaping Use   Vaping status: Never Used  Substance and Sexual Activity   Alcohol use: No    Alcohol/week: 0.0 standard drinks of alcohol   Drug use: No   Sexual activity: Not on file  Other Topics Concern   Not on file  Social History Narrative   Patient is widowed and her grandson stay with her.    Retired.   Education high school.   Right handed.   Caffeine coffee one cup daily.   Social Drivers of Health   Tobacco Use: Low Risk  (08/06/2024)   Patient History    Smoking Tobacco Use: Never    Smokeless Tobacco Use: Never    Passive Exposure: Not on file  Financial Resource Strain: Low Risk (08/06/2024)   Overall Financial Resource Strain (CARDIA)    Difficulty of Paying Living Expenses: Not very hard  Food Insecurity: No Food Insecurity (08/06/2024)   Epic    Worried About Radiation Protection Practitioner of Food in the Last Year: Never true    Ran Out of Food in the Last Year: Never true  Transportation Needs: No Transportation Needs (08/06/2024)   Epic    Lack of Transportation (Medical): No    Lack of Transportation (Non-Medical): No  Physical Activity: Unknown (08/06/2024)   Exercise Vital Sign    Days of Exercise per Week: 7 days    Minutes of Exercise per Session: Not on file  Recent Concern: Physical Activity - Insufficiently Active (08/06/2024)   Exercise Vital Sign    Days of Exercise per Week: 3 days    Minutes of Exercise per Session: 30 min  Stress: No Stress Concern Present (08/06/2024)   [employer] of Occupational Health - Occupational Stress Questionnaire    Feeling of Stress: Not at all  Social Connections: Moderately Isolated (08/06/2024)   Social Connection and Isolation Panel    Frequency of Communication with Friends and Family: More than three times a week    Frequency of Social Gatherings with Friends and Family: More than three times a week    Attends Religious Services: More than 4 times per year    Active Member of [employer] or Organizations: No    Attends Banker Meetings: Never    Marital Status: Widowed  Intimate Partner Violence: Not At Risk (08/06/2024)   Epic    Fear of Current or Ex-Partner: No    Emotionally Abused: No    Physically Abused: No    Sexually Abused: No  Depression (PHQ2-9): Low Risk (08/06/2024)   Depression (PHQ2-9)    PHQ-2 Score: 0  Alcohol Screen: Low Risk (08/06/2024)   Alcohol Screen    Last Alcohol Screening Score (AUDIT): 0  Housing: Low Risk (08/06/2024)    Epic    Unable to Pay for Housing in the Last Year: No    Number of Times Moved in the Last Year: 0    Homeless in the Last Year: No  Utilities: Not At Risk (08/06/2024)   Epic    Threatened with loss of utilities: No  Health Literacy: Adequate Health Literacy (08/06/2024)   B1300 Health Literacy    Frequency of need for help with medical instructions: Never    Family History  Problem Relation Age of Onset   Heart attack Mother    Diabetes Mother    Stroke Father    High blood pressure Father     Medications Ordered Prior to Encounter[1]  Allergies[2]     Physical Exam Vitals requested from patient and listed below if patient had equipment and was able to obtain at home for this virtual visit: There were no vitals filed for this visit. Estimated body mass index is 27.07 kg/m as calculated from the following:   Height  as of 01/22/24: 5' 3 (1.6 m).   Weight as of 06/09/24: 152 lb 12.8 oz (69.3 kg).  EKG (optional): deferred due to virtual visit  GENERAL: alert, oriented, no acute distress detected, full vision exam deferred due to pandemic and/or virtual encounter  PSYCH/NEURO: pleasant and cooperative, no obvious depression or anxiety, speech and thought processing grossly intact, Cognitive function grossly intact  Flowsheet Row Office Visit from 08/05/2023 in [facility] [place]  PHQ-9 Total Score 1        08/06/2024   11:44 AM 08/05/2023    1:31 PM 08/05/2023    1:23 PM 03/26/2023   10:25 AM 09/04/2021   10:07 AM  Depression screen PHQ 2/9  Decreased Interest 0 0 0 2 2  Down, Depressed, Hopeless 0 0 0 0 0  PHQ - 2 Score 0 0 0 2 2  Altered sleeping  0  0 0  Tired, decreased energy  1  1 1   Change in appetite  0  0 1  Feeling bad or failure about yourself   0  0 1  Trouble concentrating  0  0 0  Moving slowly or fidgety/restless  0  2 0  Suicidal thoughts  0  0 0  PHQ-9 Score  1   5  5       Data saved with a previous flowsheet row  definition       09/04/2021   10:08 AM 03/26/2023   10:24 AM 08/05/2023    1:25 PM 08/06/2024   11:43 AM 08/06/2024   12:22 PM  Fall Risk  Falls in the past year? 0 0 0 0   Was there an injury with Fall? 0  0  0  0   Fall Risk Category Calculator 0 0 0 0   Fall Risk Category (Retired) Low       Patient at Risk for Falls Due to    No Fall Risks No Fall Risks  Fall risk Follow up Falls evaluation completed  Falls evaluation completed Falls evaluation completed Falls evaluation completed Falls evaluation completed     Data saved with a previous flowsheet row definition     SUMMARY AND PLAN:  Medicare annual wellness visit, subsequent   Discussed applicable health maintenance/preventive health measures and advised and referred or ordered per patient preferences: -she says she had her pneumonia and tetanus shots a few months ago - requested she get the records if possible Health Maintenance  Topic Date Due   DTaP/Tdap/Td (1 - Tdap) Never done   Pneumococcal Vaccine: 50+ Years (2 of 2 - PPSV23, PCV20, or PCV21) 06/22/2019   COVID-19 Vaccine (8 - 2025-26 season) 11/17/2024   Medicare Annual Wellness (AWV)  08/06/2025   Influenza Vaccine  Completed   Bone Density Scan  Completed   Zoster Vaccines- Shingrix  Completed   Meningococcal B Vaccine  Aged Out      Education and counseling on the following was provided based on the above review of health and a plan/checklist for the patient, along with additional information discussed, was provided for the patient in the patient instructions :  -asked to bring copy of the advanced directives -Advised and counseled on a healthy lifestyle - including the importance of a healthy diet, regular physical activity, social connections and stress management. -Reviewed patient's current diet. Advised and counseled on a whole foods based healthy diet. A summary of a healthy diet was provided in the Patient Instructions.  -reviewed patient's current  physical activity level and discussed exercise guidelines for adults. Discussed community resources and ideas for safe exercise at home to assist in meeting exercise guideline recommendations in a safe and healthy way.  -Advise yearly dental visits at minimum and regular eye exams   Follow up: see patient instructions     Patient Instructions  I really enjoyed getting to talk with you today! I am available on Tuesdays and Thursdays for virtual visits if you have any questions or concerns, or if I can be of any further assistance.   CHECKLIST FROM ANNUAL WELLNESS VISIT:  -Follow up (please call to schedule if not scheduled after visit):   -yearly for annual wellness visit with primary care office  Here is a list of your preventive care/health maintenance measures and the plan for each if any are due:  PLAN For any measures below that may be due:    1. Please bring a copy of you vaccine records for Dr. Theophilus.   Health Maintenance  Topic Date Due   DTaP/Tdap/Td (1 - Tdap) Never done   Pneumococcal Vaccine: 50+ Years (2 of 2 - PPSV23, PCV20, or PCV21) 06/22/2019   Medicare Annual Wellness (AWV)  08/04/2024   COVID-19 Vaccine (8 - 2025-26 season) 11/17/2024   Influenza Vaccine  Completed   Bone Density Scan  Completed   Zoster Vaccines- Shingrix  Completed   Meningococcal B Vaccine  Aged Out    -See a dentist at least yearly  -Get your eyes checked and then per your eye specialist's recommendations  -Other issues addressed today:   -I have included below further information regarding a healthy whole foods based diet, physical activity guidelines for adults, stress management and opportunities for social connections. I hope you find this information useful.    -----------------------------------------------------------------------------------------------------------------------------------------------------------------------------------------------------------------------------------------------------------    NUTRITION: -eat real food: lots of colorful vegetables (half the plate) and fruits -5-7 servings of vegetables and fruits per day (fresh or steamed is best), exp. 2 servings of vegetables with lunch and dinner and 2 servings of fruit per day. Berries and greens such as kale and collards are great choices.  -consume on a regular basis:  fresh fruits, fresh veggies, fish, nuts, seeds, healthy oils (such as olive oil, avocado oil), whole grains (make sure for bread/pasta/crackers/etc., that the first ingredient on label contains the word whole), legumes. -can eat small amounts of dairy and lean meat (no larger than the palm of your hand), but avoid processed meats such as ham, bacon, lunch meat, etc. -drink water -try to avoid fast food and pre-packaged foods, processed meat, ultra processed foods/beverages (donuts, candy, etc.) -most experts advise limiting sodium to < 2300mg  per day, should limit further is any chronic conditions such as high blood pressure, heart disease, diabetes, etc. The American Heart Association advised that < 1500mg  is is ideal -try to avoid foods/beverages that contain any ingredients with names you do not recognize  -try to avoid foods/beverages  with added sugar or sweeteners/sweets  -try to avoid sweet drinks (including diet drinks): soda, juice, Gatorade, sweet tea, power drinks, diet drinks -try to avoid white rice, white bread, pasta (unless whole grain)  EXERCISE GUIDELINES FOR ADULTS: -if you wish to increase your physical activity, do so gradually and with the approval of your doctor -STOP and seek medical care immediately if you have any chest pain, chest discomfort or trouble breathing when starting or  increasing exercise  -move and stretch your body, legs, feet and arms when sitting for long periods -  Physical activity guidelines for optimal health in adults: -get at least 150 minutes per week of moderate exercise (can talk, but not sing); this is about 20-30 minutes of sustained activity 5-7 days per week or two 10-15 minute episodes of sustained activity 5-7 days per week -do some muscle building/resistance training/strength training at least 2 days per week  -balance exercises 3+ days per week:   Stand somewhere where you have something sturdy to hold onto if you lose balance    1) lift up on toes, then back down, start with 5x per day and work up to 20x   2) stand and lift one leg straight out to the side so that foot is a few inches of the floor, start with 5x each side and work up to 20x each side   3) stand on one foot, start with 5 seconds each side and work up to 20 seconds on each side  If you need ideas or help with getting more active:  -Silver sneakers https://tools.silversneakers.com  -Walk with a Doc: Http://www.duncan-williams.com/  -try to include resistance (weight lifting/strength building) and balance exercises twice per week: or the following link for ideas: http://castillo-powell.com/  buyducts.dk  STRESS MANAGEMENT: -can try meditating, or just sitting quietly with deep breathing while intentionally relaxing all parts of your body for 5 minutes daily -if you need further help with stress, anxiety or depression please follow up with your primary doctor or contact the wonderful folks at [facility]: [phone]  SOCIAL CONNECTIONS: -options in [place] if you wish to engage in more social and exercise related activities:  -Silver sneakers https://tools.silversneakers.com  -Walk with a Doc: Http://www.duncan-williams.com/  -Check out the [facility] Active Adults 50+  section on the [place] of [employer] (hiking clubs, book clubs, cards and games, chess, exercise classes, aquatic classes and much more) - see the website for details: https://www.[place].gov/departments/parks-recreation/active-adults50  -YouTube has lots of exercise videos for different ages and abilities as well  -[facility] (a variety of indoor and outdoor inperson activities for adults). [phone]. [address]  -Virtual Online Classes (a variety of topics): see seniorplanet.org or call [phone]  -consider volunteering at a school, hospice center, church, senior center or elsewhere            Chiquita JONELLE Cramp, DO      [1]  Current Outpatient Medications on File Prior to Visit  Medication Sig Dispense Refill   acetaminophen  (TYLENOL ) 325 MG tablet Take 650 mg by mouth every 6 (six) hours as needed.     amLODipine  (NORVASC ) 5 MG tablet TAKE 1 TABLET(5 MG) BY MOUTH DAILY 90 tablet 1   dorzolamide -timolol (COSOPT) 2-0.5 % ophthalmic solution Place 1 drop into the left eye 2 (two) times daily.     ELIQUIS  5 MG TABS tablet TAKE 1 TABLET(5 MG) BY MOUTH TWICE DAILY 180 tablet 1   furosemide  (LASIX ) 20 MG tablet TAKE 1/2 TABLET(10 MG) BY MOUTH DAILY 45 tablet 1   meclizine  (ANTIVERT ) 12.5 MG tablet Take 12.5 mg by mouth 3 (three) times daily as needed for dizziness.     meloxicam  (MOBIC ) 7.5 MG tablet Take 1 tablet (7.5 mg total) by mouth daily. 10 tablet 0   omeprazole  (PRILOSEC) 20 MG capsule Take 1 capsule (20 mg total) by mouth every other day. 90 capsule 1   potassium chloride  (KLOR-CON ) 10 MEQ tablet TAKE 1 TABLET(10 MEQ) BY MOUTH DAILY 90 tablet 0   triamcinolone  (KENALOG ) 0.1 % paste as needed.  atorvastatin  (LIPITOR) 40 MG tablet Take 1 tablet (40 mg total) by mouth daily. (Patient not taking: Reported on 06/09/2024) 90 tablet 3   cetirizine  (ZYRTEC ) 10 MG tablet Take 1 tablet (10 mg total) by mouth daily. (Patient not taking:  Reported on 06/09/2024) 30 tablet 11   No current facility-administered medications on file prior to visit.  [2] No Known Allergies  "

## 2024-08-12 ENCOUNTER — Other Ambulatory Visit: Payer: Self-pay | Admitting: Internal Medicine

## 2024-08-12 DIAGNOSIS — I10 Essential (primary) hypertension: Secondary | ICD-10-CM

## 2024-09-18 ENCOUNTER — Ambulatory Visit

## 2024-12-18 ENCOUNTER — Ambulatory Visit

## 2025-03-19 ENCOUNTER — Ambulatory Visit

## 2025-06-18 ENCOUNTER — Ambulatory Visit

## 2025-09-17 ENCOUNTER — Ambulatory Visit
